# Patient Record
Sex: Male | Born: 1990 | Race: Black or African American | Hispanic: No | Marital: Single | Smoking: Current every day smoker
Health system: Southern US, Community
[De-identification: ages and names within clinical notes are randomized; demographics above are authoritative.]

---
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
## 2015-05-05 ENCOUNTER — Emergency Department (HOSPITAL_COMMUNITY)
Admission: EM | Admit: 2015-05-05 | Discharge: 2015-05-05 | Disposition: A | Discharge status: Home / Self Care | Payer: Self-pay | Attending: Physician Assistant | Admitting: Physician Assistant

## 2015-05-05 ENCOUNTER — Encounter (HOSPITAL_COMMUNITY): Payer: Self-pay | Admitting: General Practice

## 2015-05-05 ENCOUNTER — Emergency Department (HOSPITAL_COMMUNITY): Payer: Self-pay

## 2015-05-05 DIAGNOSIS — S60417A Abrasion of left little finger, initial encounter: Secondary | ICD-10-CM | POA: Insufficient documentation

## 2015-05-05 DIAGNOSIS — Y9289 Other specified places as the place of occurrence of the external cause: Secondary | ICD-10-CM | POA: Insufficient documentation

## 2015-05-05 DIAGNOSIS — Y9389 Activity, other specified: Secondary | ICD-10-CM | POA: Insufficient documentation

## 2015-05-05 DIAGNOSIS — S60312A Abrasion of left thumb, initial encounter: Secondary | ICD-10-CM | POA: Insufficient documentation

## 2015-05-05 DIAGNOSIS — S01111A Laceration without foreign body of right eyelid and periocular area, initial encounter: Secondary | ICD-10-CM | POA: Insufficient documentation

## 2015-05-05 DIAGNOSIS — S199XXA Unspecified injury of neck, initial encounter: Secondary | ICD-10-CM | POA: Insufficient documentation

## 2015-05-05 DIAGNOSIS — Y998 Other external cause status: Secondary | ICD-10-CM | POA: Insufficient documentation

## 2015-05-05 DIAGNOSIS — S4991XA Unspecified injury of right shoulder and upper arm, initial encounter: Secondary | ICD-10-CM | POA: Insufficient documentation

## 2015-05-05 DIAGNOSIS — S299XXA Unspecified injury of thorax, initial encounter: Secondary | ICD-10-CM | POA: Insufficient documentation

## 2015-05-05 DIAGNOSIS — Z720 Tobacco use: Secondary | ICD-10-CM | POA: Insufficient documentation

## 2015-05-05 DIAGNOSIS — Z23 Encounter for immunization: Secondary | ICD-10-CM | POA: Insufficient documentation

## 2015-05-05 LAB — CBC WITH DIFFERENTIAL/PLATELET
BASOS ABS: 0 10*3/uL (ref 0.0–0.1)
BASOS PCT: 0 %
EOS ABS: 0 10*3/uL (ref 0.0–0.7)
EOS PCT: 0 %
HCT: 43.7 % (ref 39.0–52.0)
HEMOGLOBIN: 15.4 g/dL (ref 13.0–17.0)
Lymphocytes Relative: 16 %
Lymphs Abs: 2 10*3/uL (ref 0.7–4.0)
MCH: 30.7 pg (ref 26.0–34.0)
MCHC: 35.2 g/dL (ref 30.0–36.0)
MCV: 87.2 fL (ref 78.0–100.0)
Monocytes Absolute: 1 10*3/uL (ref 0.1–1.0)
Monocytes Relative: 8 %
NEUTROS PCT: 76 %
Neutro Abs: 9.5 10*3/uL — ABNORMAL HIGH (ref 1.7–7.7)
PLATELETS: 293 10*3/uL (ref 150–400)
RBC: 5.01 MIL/uL (ref 4.22–5.81)
RDW: 13.3 % (ref 11.5–15.5)
WBC: 12.5 10*3/uL — AB (ref 4.0–10.5)

## 2015-05-05 LAB — BASIC METABOLIC PANEL
Anion gap: 9 (ref 5–15)
BUN: 11 mg/dL (ref 6–20)
CHLORIDE: 103 mmol/L (ref 101–111)
CO2: 24 mmol/L (ref 22–32)
Calcium: 9.6 mg/dL (ref 8.9–10.3)
Creatinine, Ser: 1.1 mg/dL (ref 0.61–1.24)
Glucose, Bld: 95 mg/dL (ref 65–99)
POTASSIUM: 3.2 mmol/L — AB (ref 3.5–5.1)
SODIUM: 136 mmol/L (ref 135–145)

## 2015-05-05 MED ORDER — OXYCODONE-ACETAMINOPHEN 5-325 MG PO TABS
2.0000 | ORAL_TABLET | Freq: Once | ORAL | Status: AC
  Administered 2015-05-05: 2 via ORAL
  Filled 2015-05-05: qty 2

## 2015-05-05 MED ORDER — LIDOCAINE HCL (PF) 1 % IJ SOLN
5.0000 mL | Freq: Once | INTRAMUSCULAR | Status: AC
  Administered 2015-05-05: 5 mL
  Filled 2015-05-05: qty 5

## 2015-05-05 MED ORDER — POTASSIUM CHLORIDE CRYS ER 20 MEQ PO TBCR
40.0000 meq | EXTENDED_RELEASE_TABLET | Freq: Once | ORAL | Status: AC
  Administered 2015-05-05: 40 meq via ORAL
  Filled 2015-05-05: qty 2

## 2015-05-05 MED ORDER — TETANUS-DIPHTH-ACELL PERTUSSIS 5-2.5-18.5 LF-MCG/0.5 IM SUSP
0.5000 mL | Freq: Once | INTRAMUSCULAR | Status: AC
  Administered 2015-05-05: 0.5 mL via INTRAMUSCULAR
  Filled 2015-05-05: qty 0.5

## 2015-05-05 MED ORDER — IBUPROFEN 600 MG PO TABS: 600.0000 mg | ORAL_TABLET | Freq: Four times a day (QID) | ORAL | Status: DC | PRN

## 2015-05-05 NOTE — ED Notes (Signed)
Checked patient vision right eye patient was 20/25 both eyes 20/20 left eye 20/20
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
## 2015-05-05 NOTE — Discharge Instructions (Signed)
1. Medications: ibuprofen, usual home medications 2. Treatment: rest, drink plenty of fluids 3. Follow Up: please followup with your primary doctor in 1 week for suture removal; if you do not have a primary care doctor use the resource guide provided to find one; please return to the ER for severe headache, numbness, weakness, persistent vomiting, lethargy, new or worsening symptoms   Emergency Department Resource Guide 1) Find a Doctor and Pay Out of Pocket Although you won't have to find out who is covered by your insurance plan, it is a good idea to ask around and get recommendations. You will then need to call the office and see if the doctor you have chosen will accept you as a new patient and what types of options they offer for patients who are self-pay. Some doctors offer discounts or will set up payment plans for their patients who do not have insurance, but you will need to ask so you aren't surprised when you get to your appointment.  2) Contact Your Local Health Department Not all health departments have doctors that can see patients for sick visits, but many do, so it is worth a call to see if yours does. If you don't know where your local health department is, you can check in your phone book. The CDC also has a tool to help you locate your state's health department, and many state websites also have listings of all of their local health departments.  3) Find a Walk-in Clinic If your illness is not likely to be very severe or complicated, you may want to try a walk in clinic. These are popping up all over the country in pharmacies, drugstores, and shopping centers. They're usually staffed by nurse practitioners or physician assistants that have been trained to treat common illnesses and complaints. They're usually fairly quick and inexpensive. However, if you have serious medical issues or chronic medical problems, these are probably not your best option.  No Primary Care Doctor: - Call  Health Connect at  [phone] - they can help you locate a primary care doctor that  accepts your insurance, provides certain services, etc. - Physician Referral Service- [phone]  Chronic Pain Problems: Organization         Address  Phone   Notes  [facility]  [phone] Patients need to be referred by their primary care doctor.   Medication Assistance: Organization         Address  Phone   Notes  [facility] Medication [facility] [address] [place], [place] [address] [phone] --Must be a resident of [facility] -- Must have NO insurance coverage whatsoever (no Medicaid/ Medicare, etc.) -- The pt. MUST have a primary care doctor that directs their care regularly and follows them in the community   [facility]ist  [phone]   [employer]  [phone]    Agencies that provide inexpensive medical care: Organization         Address  Phone   Notes  [facility]  [phone]   [facility]    [phone]   [facility] [address] [place], [place] [address] [phone]   [facility] [address] [place], [place] [phone]   Planned Parenthood    [phone]   [facility]    [phone]   [facility]  [address] [place], [employer] Phone:  [phone], Fax:  [phone](336) [phone] Hours of Operation:  9 am - 6 pm, M-F.  Also accepts Medicaid/Medicare and self-pay.  [facility]  [address], [place] Phone: [phone]) [phone], Fax: [phone]) [phone]. Hours of Operation:  8:30 am - 5:30 pm, M-F.  Also accepts Medicaid and self-pay.  [facility] [place] [address], [place] Phone: [phone]) [phone]   [facility] [address], [place], [place] [phone], Ext. [phone] Mondays & Thursdays: 7-9 AM.  First 15 patients are seen on a first come, first serve  basis.    Medicaid-accepting [facility] Providers:  Organization         Address  Phone   Notes  [facility] [address], [place] [phone]) [phone] Also accepts self-pay patients.  [facility] [address], [place]  [phone](336) [phone]   [facility] [address], [place] [phone]) [phone]   [facility] [address] [place] [address], [place] [phone](336) [phone]   Renaye RakersVeita Bland [address], [place]   [phone] Only accepts [place] Access [place] patients after they have their name applied to their card.   Self-Pay (no insurance) in [facility]:  Organization         Address  Phone   Notes  Sickle Cell Patients, [facility] [address], [place] [phone]) [phone]   [facility] [address], [place] [phone]) [phone]   [facility]  [address], [place] [phone]) [phone]   [facility]/Dr. Osei-Bonsu  [address] [place] [address], [place] or [phone], [place] [phone]) [phone] Phone number for both [place] and [place] locations is the same.  [facility] [address], [place] [phone]) [phone]   [facility] [place] [address] [place] [address], [place] or [address] [phone]) [phone]) [phone]   [facility] [address], [place] [phone](336) [phone], phone; [phone](336) [phone], fax Sees patients 1st and 3rd Saturday of every month.  Must not qualify for public or private insurance (i.e. Medicaid, Medicare, [place] Health Choice, Veterans' Benefits)  Household income should be no more than 200% of the poverty level The clinic cannot treat you if you are pregnant or think you are pregnant  Sexually transmitted diseases are not treated at the clinic.    Dental Care: Organization         Address  Phone  Notes  [facility] Department of [facility] [address], [place] [phone]) [phone] Accepts children up to age 25 who are enrolled in [place]icaid or [place] Health Choice; pregnant women with a Medicaid card; and children who have applied for Medicaid or [place] Health Choice, but were declined, whose parents can pay a reduced fee at time of service.  [facility] of [facility] [place]  [address], [place] [phone]) [phone] Accepts children up to age 25 who are enrolled in [place] or [place] Health Choice; pregnant women with a Medicaid card; and children who have applied for Medicaid or  Health Choice, but were declined, whose parents can pay a reduced fee at time of service.  [facility]  [address], [place] [phone] Patients are seen by appointment only. Walk-ins  are not accepted. [place] will see patients 35 years of age and older. Monday - Tuesday (8am-5pm) Most Wednesdays (8:30-5pm) $30 per visit, cash only  [facility] Access PRO[facility]  [address], [facility] [phone] Patients are seen by appointment only. Walk-ins are not accepted. [place] will see patients 30 years of age and older. One Wednesday Evening (Monthly: Volunteer Based).  $30 per visit, cash only  [place]  [phone] for adults; Children under age 99, call Graduate Pediatric Dentistry at [phone]. Children aged 48-14, please call [phone] to request a pediatric application.  Dental services are provided in all areas of dental care including fillings, crowns and bridges, complete and partial dentures, implants, gum treatment, root canals, and extractions. Preventive care is also provided. Treatment is provided to both adults and children. Patients are selected via a lottery and there is often a waiting list.   [facility] [address], [place]  [phone]  [email or web address]   [facility] [address] [place], [place] [phone], Ext. [phone] Second and Fourth Thursday of each month, opens at 6:30 AM; Clinic ends at 9 AM.  Patients are seen on a first-come first-served basis, and a limited number are seen during each clinic.   [facility]  [address] Dank[address] [place], [place] [phone]   Eligibility Requirements You must have lived in [place], [place], or [place] counties for at least the last three months.   You cannot be eligible for state or federal sponsored [employer], including [employer], [place], or [employer].   You generally cannot be eligible for healthcare insurance through your employer.    How to apply: Eligibility screenings are held every Tuesday and Wednesday afternoon from 1:00 pm until 4:00 pm. You do not need an appointment for the interview!  [facility] [address] [place], [place]   [place] Department  [place]  [phone]    Behavioral Health Resources in the Community: Intensive Outpatient Programs Organization         Address  Phone  Notes  [place] [address], [place], [place] [phone]   [facility] Outpatient [address] [place], [place]   ADS: Alcohol & Drug Svcs [address], [place], [place]   [place] [address]  [place], [place] or [phone]   Substance Abuse Resources Organization         Address  Phone  Notes  Alcohol and Drug Services  [phone]   [place]  [phone]   The [place]   Chinita Pester  [phone]   Residential & Outpatient Substance Abuse Program  [phone]   Psychological Services Organization          Address  Phone  Notes  [facility] [place]  [place]  [phone]   [place] [address], [place] [phone] or [phone]    Mobile Crisis Teams Organization         Address  Phone  Notes  [facility], Mobile Crisis Care Unit  [phone]   [facility]  [address] [place], [place]   [facility] [address], [place] [phone]  Self-Help/Support Groups Organization         Address  Phone             Notes  [facility]ental Health Assoc. of [place] - variety of support groups  336- [phone] Call for more information  Narcotics Anonymous (NA), [facility] [address], [place]  2 meetings at this location   Statisticianesidential Treatment Programs Organization         Address  Phone  Notes  [facility] [address],    [place]  [phone]   [facility]  [address], [place] [phone], [place], [place] [phone]   [facility] [address], [place] [phone] Admissions: 8am-3pm M-F  [facility] [address]    [place], [place] [phone]   [facility] [address], [place], [place] [phone]   [facility] [address]  [place], [place] [phone]   [facility] - Intensive Outpatient [address], [place], [place] [phone]   [facility] ([facility] Recovery [facility] [address]  [place], [place] [phone] or [phone]   [facility] (RTS) [address] [place], [place] [phone] Accepts Medicaid  [facility] [address]  [place] [phone] Substance Abuse/Addiction Treatment   [facility] Behavioral Health Resources Organization         Address  Phone  Notes  [facility]  [phone]) [phone]   Angie FavaJulie Brannon, PhD [address], [address] [place], [place]   [phone](336) [phone] or [phone](336) [phone]   [facility]   [address] [place], [place] [phone](336) [phone]   [facility] [address], [place], [place] [phone](336) [phone] Insurance/Medicaid/sponsorship through [facility]  Faith and Families [address]                                    [place], [place] [phone]) [phone] Therapy/tele-psych/case  [facility] [address].   [place], [place] [phone]) [phone]    Dr. Lolly MustacheArfeen  [phone](336) [phone]   [facility]  [employer] [facility] 1) [address], [place] 2) [address], [place] 3)  [address], [place] [phone](336) [phone](336) [phone]  [phone](336) [phone]   [facility] Child Abuse Hotline 8087837538(336) [phone] or [phone](336) [phone] (After Hours)
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
## 2015-07-16 ENCOUNTER — Emergency Department (HOSPITAL_COMMUNITY)
Admission: EM | Admit: 2015-07-16 | Discharge: 2015-07-16 | Disposition: A | Discharge status: Home / Self Care | Payer: BLUE CROSS/BLUE SHIELD | Attending: Emergency Medicine | Admitting: Emergency Medicine

## 2015-07-16 ENCOUNTER — Encounter (HOSPITAL_COMMUNITY): Payer: Self-pay | Admitting: Emergency Medicine

## 2015-07-16 DIAGNOSIS — R300 Dysuria: Secondary | ICD-10-CM | POA: Insufficient documentation

## 2015-07-16 DIAGNOSIS — F17210 Nicotine dependence, cigarettes, uncomplicated: Secondary | ICD-10-CM | POA: Diagnosis not present

## 2015-07-16 DIAGNOSIS — N4889 Other specified disorders of penis: Secondary | ICD-10-CM | POA: Insufficient documentation

## 2015-07-16 LAB — URINALYSIS, ROUTINE W REFLEX MICROSCOPIC
BILIRUBIN URINE: NEGATIVE
GLUCOSE, UA: NEGATIVE mg/dL
KETONES UR: NEGATIVE mg/dL
Nitrite: NEGATIVE
PH: 6 (ref 5.0–8.0)
PROTEIN: NEGATIVE mg/dL
Specific Gravity, Urine: 1.024 (ref 1.005–1.030)

## 2015-07-16 LAB — URINE MICROSCOPIC-ADD ON: Bacteria, UA: NONE SEEN

## 2015-07-16 MED ORDER — CEFTRIAXONE SODIUM 250 MG IJ SOLR
250.0000 mg | INTRAMUSCULAR | Status: DC
  Administered 2015-07-16: 250 mg via INTRAMUSCULAR
  Filled 2015-07-16: qty 250

## 2015-07-16 MED ORDER — STERILE WATER FOR INJECTION IJ SOLN
10.0000 mL | Freq: Once | INTRAMUSCULAR | Status: AC
  Administered 2015-07-16: 10 mL via INTRAMUSCULAR
  Filled 2015-07-16: qty 10

## 2015-07-16 MED ORDER — AZITHROMYCIN 250 MG PO TABS
1000.0000 mg | ORAL_TABLET | Freq: Once | ORAL | Status: AC
  Administered 2015-07-16: 1000 mg via ORAL
  Filled 2015-07-16: qty 4

## 2015-07-16 NOTE — Discharge Instructions (Signed)
You have been treated in the Emergency Department today for Gonorrhea and Chlamydia. If your results come back positive, you will receive a call from the hospital in the next 24-48 hours. If your labs come back positive, you need to inform your sexual partner(s) and advise them to either come to the Emergency Department or be treated at the Health Department. Please follow up with a primary care provider from the Resource Guide provided below in 4-5 days. Please return to the Emergency Department if symptoms worsen or new onset of fever, abdominal pain, nausea, vomiting, penile swelling, testicular swelling, rash, penile discharge, urinary retention.   Emergency Department Resource Guide 1) Find a Doctor and Pay Out of Pocket Although you won't have to find out who is covered by your insurance plan, it is a good idea to ask around and get recommendations. You will then need to call the office and see if the doctor you have chosen will accept you as a new patient and what types of options they offer for patients who are self-pay. Some doctors offer discounts or will set up payment plans for their patients who do not have insurance, but you will need to ask so you aren't surprised when you get to your appointment.  2) Contact Your Local Health Department Not all health departments have doctors that can see patients for sick visits, but many do, so it is worth a call to see if yours does. If you don't know where your local health department is, you can check in your phone book. The CDC also has a tool to help you locate your state's health department, and many state websites also have listings of all of their local health departments.  3) Find a Walk-in Clinic If your illness is not likely to be very severe or complicated, you may want to try a walk in clinic. These are popping up all over the country in pharmacies, drugstores, and shopping centers. They're usually staffed by nurse practitioners or  physician assistants that have been trained to treat common illnesses and complaints. They're usually fairly quick and inexpensive. However, if you have serious medical issues or chronic medical problems, these are probably not your best option.  No Primary Care Doctor: - Call Health Connect at  [phone] - they can help you locate a primary care doctor that  accepts your insurance, provides certain services, etc. - Physician Referral Service- [phone]  Chronic Pain Problems: Organization         Address  Phone   Notes  [facility]  [phone] Patients need to be referred by their primary care doctor.   Medication Assistance: Organization         Address  Phone   Notes  [facility] [address] [place] [place], [place] [address] [phone] --Must be a resident of [facility] -- Must have NO insurance coverage whatsoever (no Medicaid/ Medicare, etc.) -- The pt. MUST have a primary care doctor that directs their care regularly and follows them in the community   [facility]  [phone]   [employer]  [phone]    Agencies that provide inexpensive medical care: Organization         Address  Phone   Notes  [facility]  [phone]   [facility]    [phone]   [facility] [address] [place], [place] [address] [phone]  [facility] [address] [place], [place] [phone]   Planned Parenthood    [phone]   [facility]    [phone]   Community Health and [facility]  [address], [place] Phone:  [phone], Fax:  [phone] Hours of Operation:  9 am - 6 pm, M-F.  Also accepts Medicaid/Medicare and self-pay.  [facility]  [address], [place] Phone: [phone], Fax: [phone]. Hours of Operation:  8:30 am - 5:30 pm, M-F.   Also accepts Medicaid and self-pay.  [facility] [place] [address] [place] Phone: [phone]   [facility] [address] Natasha Bence [place], [place] [phone], Ext. [phone] Mondays & Thursdays: 7-9 AM.  First 15 patients are seen on a first come, first serve basis.    Medicaid-accepting [facility] Providers:  Organization         Address  Phone   Notes  [facility] [address], [place] [phone] Also accepts self-pay patients.  [facility] [address] Laurell Josephs [place], [place]  [phone]   [facility] [address], [place] [phone]   [facility] [address], [place] [phone]   Renaye Rakers [address], [place]   [phone] Only accepts [place] Access [place] patients after they have their name applied to their card.   Self-Pay (no insurance) in [facility]:  Organization         Address  Phone   Notes  Sickle Cell Patients, [facility] [address] [place], [place] [phone]   [facility] Urgent Care [address] [place], [place] [phone]   [facility]  [address] [place] [address], [place] [phone]   [facility]/Dr. Osei-Bonsu  [address] [place] or [address], [place] [phone] Phone number for both [place] and [place] locations is the same.  [facility]gent Medical and [facility] [address] [place] [phone]   [facility] [address], [place] or [address] [phone]   [facility] [address], [place] [phone], phone; [phone], fax Sees patients 1st and 3rd Saturday of every month.  Must not qualify for public or private insurance (i.e. Medicaid, Medicare, [place] Health Choice, Veterans'  Benefits)  Household income should be no more than 200% of the poverty level The clinic cannot treat you if you are pregnant or think you are pregnant  Sexually transmitted diseases are not treated at the clinic.    Dental Care: Organization         Address  Phone  Notes  [facility] Department of [facility] [address] [place], [place] [phone] Accepts children up to age 79 who are enrolled in [place] or [place] Health Choice; pregnant women with a Medicaid card; and children who have applied for Medicaid or [place] Health Choice, but were declined, whose parents can pay a reduced fee at time of service.  [facility] Department of [facility]  [address], [place] [phone] Accepts children up to age 76 who are enrolled in [place] or  Health Choice; pregnant women with a Medicaid card; and  children who have applied for Medicaid or [place] Health Choice, but were declined, whose parents can pay a reduced fee at time of service.  [facility]  [address] [place], [place] [phone] Patients are seen by appointment only. Walk-ins are not accepted. [facility] will see patients 54 years of age and older. Monday - Tuesday (8am-5pm) Most Wednesdays (8:30-5pm) $30 per visit, cash only  [facility] Adult Dental Access [facility]  [address], [facility] [phone] Patients are seen by appointment only. Walk-ins are not accepted. [facility] will see patients 57 years of age and older. One Wednesday Evening (Monthly: Volunteer Based).  $30 per visit, cash only  [employer]  [phone] for adults; Children under age 68, call Graduate Pediatric Dentistry at [phone]. Children aged 50-14, please call [phone] to request a pediatric application.  Dental services are provided in all areas of dental care including fillings, crowns and bridges, complete and partial  dentures, implants, gum treatment, root canals, and extractions. Preventive care is also provided. Treatment is provided to both adults and children. Patients are selected via a lottery and there is often a waiting list.   [facility] [address] [place]  [phone] [email or web address]   [facility] [address] [place], [place] [phone], Ext. [phone] Second and Fourth Thursday of each month, opens at 6:30 AM; Clinic ends at 9 AM.  Patients are seen on a first-come first-served basis, and a limited number are seen during each clinic.   [facility]  [address] Ether Griffins [place], [place] [phone]   Eligibility Requirements You must have lived in [place], [place], or [place] counties for at least the last three months.   You cannot be eligible for state or federal sponsored [place], including [employer], [place], or [employer].   You generally cannot be eligible for healthcare insurance through your employer.    How to apply: Eligibility screenings are held every Tuesday and Wednesday afternoon from 1:00 pm until 4:00 pm. You do not need an appointment for the interview!  [facility] [address], [place], [place] [phone]   [facility]  [phone]   [facility] Health Department  [phone]   [facility] Health Department  [phone]    Behavioral Health Resources in the Community: Intensive Outpatient Programs Organization         Address  Phone  Notes  [facility] [address] [place], [place] [phone]   [facility] Outpatient [address] [place], [place] [phone]   [facility] [address] [place], [place]  [phone]   [facility] [address]  [place], [place] [phone] or [phone]   Substance Abuse Resources Organization          Address  Phone  Notes  [facility]  2264532461   [facility]  [phone]   [facility]  [phone]   Floydene Flock  [phone]   Residential & Outpatient Substance Abuse Program  [phone]   Psychological Services Organization         Address  Phone  Notes  [facility] Behavioral Health  [phone]   [facility]  [phone]   [facility] Mental Health [address], [place] [phone] or [phone]    Mobile Crisis Teams Organization  Address  Phone  Notes  [facility], Mobile Crisis Care Unit  [phone]   [facility]  [address] [place], [place] [phone]   [facility] [address] [place] [phone]    Self-Help/Support Groups Organization         Address  Phone             Notes  [facility] [place] - variety of support groups  336- I7437963 Call for more information  Narcotics Anonymous (NA), [facility] [address], [employer] [place]  2 meetings at this location   Statistician         Address  Phone  Notes  [facility] [address],    [place]  [phone]   [facility]  [address], [place] [address], [place], [place] [phone]   [facility] [address] [place], [place] [phone] Admissions: 8am-3pm M-F  [facility] [address]    [place], [place] [phone]   [facility] [address] [place], [place], [place] [phone]   [facility] [address],  [place], [place] [phone]   [facility] - Intensive Outpatient [address] Laurell Josephs [address], [place], [place] [phone]   [facility] ([facility] [address] [place].,  [place], [place] [phone] or 6698735284   [facility] ([facility]) [address] [place], [place]  [phone] Accepts Medicaid  [facility] [place] [facility] [address],  [place] [phone] Substance Abuse/Addiction Treatment   [facility] Organization         Address  Phone  Notes  [facility]  [phone]   Angie Fava, PhD [address] Ervin Knack [place], [place]   [phone] or [phone]   [facility]   [address] [place], [place] [phone]   [facility] [address] [place], [place] [phone] Insurance/Medicaid/sponsorship through [facility] [address]                                    [place], [place] [phone] Therapy/tele-psych/case  [facility] [address], [place] [phone]    Dr. Lolly Mustache  [phone]   [facility]  [employer] [facility] Dept. 1) [address] [place] 2) [address], [place] 3)  [address], [place] [phone]  [phone]   [facility] Child Abuse Hotline (989)039-9596 or [phone] (After Hours)

## 2015-07-16 NOTE — ED Notes (Signed)
Declined W/C at D/C and was escorted to lobby by RN. 

## 2015-07-16 NOTE — ED Notes (Signed)
Pt sts hx of UTI and sts dysuria; pt denies discharge

## 2015-07-16 NOTE — ED Provider Notes (Signed)
CSN: [id number]     Arrival date & time 07/16/15  1044 History  By signing my name below, I, Linus Galas, attest that this documentation has been prepared under the direction and in the presence of Barrett Henle, PA-C. Electronically Signed: Linus Galas, ED Scribe. 07/16/2015. 1:12 PM     Chief Complaint  Patient presents with  . Dysuria   The history is provided by the patient. No language interpreter was used.   HPI Comments: Walter Grimes is a 25 y.o. male presents to the Emergency Department complaining of mild dysuria that began 1 week ago with associated penile pain. Patient states he was treated for a UTI 2 weeks ago with some relief of symptoms, pt does not remember what medication he took. Pt reports he is sexual activity and sometimes has unprotected sexual intercourse. Pt denies fever, urinary frequency, hematuria, penile discharge, rash, abdominal pain, nausea, vomiting, diarrhea, or blood in the stool.   History reviewed. No pertinent past medical history. History reviewed. No pertinent past surgical history. History reviewed. No pertinent family history. Social History  Substance Use Topics  . Smoking status: Current Every Day Smoker -- 1.00 packs/day    Types: Cigarettes  . Smokeless tobacco: None  . Alcohol Use: Yes    Review of Systems  Constitutional: Negative for fever.  Gastrointestinal: Negative for nausea, vomiting, abdominal pain, diarrhea and blood in stool.  Genitourinary: Positive for dysuria and penile pain. Negative for frequency, hematuria and discharge.    Allergies  Review of patient's allergies indicates no known allergies.  Home Medications   Prior to Admission medications   Medication Sig Start Date End Date Taking? Authorizing Provider  ibuprofen (ADVIL,MOTRIN) 600 MG tablet Take 1 tablet (600 mg total) by mouth every 6 (six) hours as needed. 05/05/15   Mady Gemma, PA-C   BP 112/66 mmHg  Pulse 62  Temp(Src) 98.7  F (37.1 C) (Oral)  Resp 18  SpO2 100% Physical Exam  Constitutional: He is oriented to person, place, and time. He appears well-developed and well-nourished.  HENT:  Head: Normocephalic and atraumatic.  Eyes: Conjunctivae and EOM are normal. Right eye exhibits no discharge. Left eye exhibits no discharge. No scleral icterus.  Neck: Normal range of motion. Neck supple.  Cardiovascular: Normal rate, regular rhythm and normal heart sounds.   Pulmonary/Chest: Effort normal and breath sounds normal. No respiratory distress. He has no wheezes. He has no rales. He exhibits no tenderness.  Abdominal: Soft. Bowel sounds are normal. He exhibits no distension and no mass. There is no tenderness. There is no rebound and no guarding.  Genitourinary: Testes normal. Circumcised. No penile erythema or penile tenderness. Discharge found.  Small amount of purulent drainage noted in urethra  Musculoskeletal: Normal range of motion.  Neurological: He is alert and oriented to person, place, and time.  Skin: Skin is warm and dry.  Psychiatric: He has a normal mood and affect.  Nursing note and vitals reviewed.   ED Course  Procedures  DIAGNOSTIC STUDIES: Oxygen Saturation is 100% on RA, nl by my interpretation.    COORDINATION OF CARE: 12:19 PM-Will order Urinalysis. Discussed treatment plan with pt at bedside and pt agreed to plan.   Labs Review Labs Reviewed  URINALYSIS, ROUTINE W REFLEX MICROSCOPIC (NOT AT [facility]) - Abnormal; Notable for the following:    APPearance CLOUDY (*)    Hgb urine dipstick TRACE (*)    Leukocytes, UA MODERATE (*)    All other components within  normal limits  URINE MICROSCOPIC-ADD ON - Abnormal; Notable for the following:    Squamous Epithelial / LPF 0-5 (*)    All other components within normal limits  URINE CULTURE  RPR  HIV ANTIBODY (ROUTINE TESTING)  GC/CHLAMYDIA PROBE AMP ([place]) NOT AT [facility]    Imaging Review No results found.   Filed Vitals:    07/16/15 1123 07/16/15 1334  BP: 108/74 112/66  Pulse: 71 62  Temp: 98.9 F (37.2 C) 98.7 F (37.1 C)  Resp: 18 18     MDM   Final diagnoses:  Dysuria    Patient presents with dysuria. Endorses being sexually active with multiple occurrences of unprotected sex.VSS. Exam revealed small amount of purulent drainage in the urethra, no penile lesions or rash noted. Abdominal exam unremarkable. UA positive for leukocytes and WBCs. Swab obtained for gonorrhea and chlamydia. Due to patient's presentation, exam and history of multiple episodes of unprotected sex will treat patient in the ED for STDs with Rocephin and Zithromax. Labs obtained for HIV and RPR. Discussed results and plan for discharge with patient. Patient given resource guide to follow up with PCP.  Evaluation does not show pathology requring ongoing emergent intervention or admission. Pt is hemodynamically stable and mentating appropriately. Discussed findings/results and plan with patient/guardian, who agrees with plan. All questions answered. Return precautions discussed and outpatient follow up given.    I personally performed the services described in this documentation, which was scribed in my presence. The recorded information has been reviewed and is accurate.      Satira Sark [place], [place] 07/16/15 1625  Doug Sou, MD 07/16/15 [phone]

## 2015-07-17 LAB — HIV ANTIBODY (ROUTINE TESTING W REFLEX): HIV SCREEN 4TH GENERATION: NONREACTIVE

## 2015-07-17 LAB — URINE CULTURE

## 2015-07-17 LAB — RPR: RPR Ser Ql: NONREACTIVE

## 2015-07-18 LAB — GC/CHLAMYDIA PROBE AMP (~~LOC~~) NOT AT ARMC
CHLAMYDIA, DNA PROBE: POSITIVE — AB
NEISSERIA GONORRHEA: POSITIVE — AB

## 2015-07-19 ENCOUNTER — Telehealth (HOSPITAL_COMMUNITY): Payer: Self-pay

## 2015-07-19 NOTE — Telephone Encounter (Signed)
Spoke with pt. Verified ID. Informed of labs. Treated per protocol. DHHS form faxed. Pt informed to abstain from sexual activity x 10 days and to notify partner for testing and treatment.  

## 2015-08-18 ENCOUNTER — Emergency Department (HOSPITAL_COMMUNITY)
Admission: EM | Admit: 2015-08-18 | Discharge: 2015-08-18 | Disposition: A | Discharge status: Home / Self Care | Payer: BLUE CROSS/BLUE SHIELD | Attending: Emergency Medicine | Admitting: Emergency Medicine

## 2015-08-18 ENCOUNTER — Encounter (HOSPITAL_COMMUNITY): Payer: Self-pay | Admitting: *Deleted

## 2015-08-18 ENCOUNTER — Emergency Department (HOSPITAL_COMMUNITY): Payer: BLUE CROSS/BLUE SHIELD

## 2015-08-18 DIAGNOSIS — R197 Diarrhea, unspecified: Secondary | ICD-10-CM | POA: Diagnosis not present

## 2015-08-18 DIAGNOSIS — R531 Weakness: Secondary | ICD-10-CM | POA: Diagnosis not present

## 2015-08-18 DIAGNOSIS — R630 Anorexia: Secondary | ICD-10-CM | POA: Diagnosis not present

## 2015-08-18 DIAGNOSIS — M791 Myalgia: Secondary | ICD-10-CM | POA: Insufficient documentation

## 2015-08-18 DIAGNOSIS — F17210 Nicotine dependence, cigarettes, uncomplicated: Secondary | ICD-10-CM | POA: Insufficient documentation

## 2015-08-18 DIAGNOSIS — R6889 Other general symptoms and signs: Secondary | ICD-10-CM

## 2015-08-18 DIAGNOSIS — J3489 Other specified disorders of nose and nasal sinuses: Secondary | ICD-10-CM | POA: Diagnosis not present

## 2015-08-18 DIAGNOSIS — R05 Cough: Secondary | ICD-10-CM | POA: Insufficient documentation

## 2015-08-18 DIAGNOSIS — R42 Dizziness and giddiness: Secondary | ICD-10-CM | POA: Insufficient documentation

## 2015-08-18 DIAGNOSIS — R109 Unspecified abdominal pain: Secondary | ICD-10-CM | POA: Diagnosis not present

## 2015-08-18 DIAGNOSIS — R112 Nausea with vomiting, unspecified: Secondary | ICD-10-CM | POA: Insufficient documentation

## 2015-08-18 DIAGNOSIS — R51 Headache: Secondary | ICD-10-CM | POA: Diagnosis not present

## 2015-08-18 DIAGNOSIS — R509 Fever, unspecified: Secondary | ICD-10-CM | POA: Insufficient documentation

## 2015-08-18 DIAGNOSIS — J029 Acute pharyngitis, unspecified: Secondary | ICD-10-CM | POA: Insufficient documentation

## 2015-08-18 MED ORDER — ONDANSETRON 4 MG PO TBDP
4.0000 mg | ORAL_TABLET | Freq: Once | ORAL | Status: AC | PRN
  Administered 2015-08-18: 4 mg via ORAL
  Filled 2015-08-18: qty 1

## 2015-08-18 MED ORDER — KETOROLAC TROMETHAMINE 60 MG/2ML IM SOLN
60.0000 mg | Freq: Once | INTRAMUSCULAR | Status: AC
  Administered 2015-08-18: 60 mg via INTRAMUSCULAR
  Filled 2015-08-18: qty 2

## 2015-08-18 MED ORDER — ACETAMINOPHEN 325 MG PO TABS
650.0000 mg | ORAL_TABLET | Freq: Once | ORAL | Status: AC | PRN
  Administered 2015-08-18: 650 mg via ORAL
  Filled 2015-08-18: qty 2

## 2015-08-18 MED ORDER — BENZONATATE 100 MG PO CAPS: 100.0000 mg | ORAL_CAPSULE | Freq: Three times a day (TID) | ORAL | Status: DC

## 2015-08-18 MED ORDER — ONDANSETRON 8 MG PO TBDP: 8.0000 mg | ORAL_TABLET | Freq: Three times a day (TID) | ORAL | Status: DC | PRN

## 2015-08-18 MED ORDER — BENZONATATE 100 MG PO CAPS
200.0000 mg | ORAL_CAPSULE | Freq: Once | ORAL | Status: AC
  Administered 2015-08-18: 200 mg via ORAL
  Filled 2015-08-18: qty 2

## 2015-08-18 MED ORDER — IBUPROFEN 800 MG PO TABS: 800.0000 mg | ORAL_TABLET | Freq: Three times a day (TID) | ORAL | Status: DC

## 2015-08-18 NOTE — ED Provider Notes (Signed)
CSN: [id number]     Arrival date & time 08/18/15  1548 History  By signing my name below, I, Tanda Rockers, attest that this documentation has been prepared under the direction and in the presence of Kamie Korber, PA-C.  Electronically Signed: Tanda Rockers, ED Scribe. 08/18/2015. 5:16 PM.   Chief Complaint  Patient presents with  . Weakness  . Cough  . Emesis   The history is provided by the patient. No language interpreter was used.     HPI Comments: Walter Grimes is a 25 y.o. male who presents to the Emergency Department complaining of gradual onset, constant, flu like symptoms including cough that began last night. Pt also complains of dizziness, nausea, vomiting, abdominal pain, generalized body aches, sore throat, rhinorrhea, and a headache. Pt mentions having diarrhea last night that has since resolved. Pt states that he has not eaten anything in the past 2 days due to loss of appetite. His temperature in the ED is 103.1. He has not taken anything for his symptoms. Pt cannot recall if he received the flu vaccine this year. Pt has had recent sick contact with similar symptoms. Denies any other associated symptoms.   History reviewed. No pertinent past medical history. History reviewed. No pertinent past surgical history. History reviewed. No pertinent family history. Social History  Substance Use Topics  . Smoking status: Current Every Day Smoker -- 1.00 packs/day    Types: Cigarettes  . Smokeless tobacco: Never Used  . Alcohol Use: Yes    Review of Systems  Constitutional: Positive for fever and appetite change.  HENT: Positive for rhinorrhea and sore throat.   Respiratory: Positive for cough.   Gastrointestinal: Positive for nausea, vomiting, abdominal pain and diarrhea (resolved).  Musculoskeletal: Positive for myalgias.  Neurological: Positive for dizziness and headaches.    Allergies  Review of patient's allergies indicates no known allergies.  Home  Medications   Prior to Admission medications   Medication Sig Start Date End Date Taking? Authorizing Provider  ibuprofen (ADVIL,MOTRIN) 600 MG tablet Take 1 tablet (600 mg total) by mouth every 6 (six) hours as needed. 05/05/15   Elizabeth C Westfall, PA-C   BP 131/76 mmHg  Pulse 100  Temp(Src) 103.1 F (39.5 C) (Oral)  Resp 22  Wt 175 lb (79.379 kg)  SpO2 100%   Physical Exam  Constitutional: He is oriented to person, place, and time. He appears well-developed and well-nourished. No distress.  HENT:  Head: Normocephalic and atraumatic.  Right Ear: Tympanic membrane, external ear and ear canal normal.  Left Ear: Tympanic membrane, external ear and ear canal normal.  Nose: Mucosal edema and rhinorrhea present.  Mouth/Throat: Uvula is midline, oropharynx is clear and moist and mucous membranes are normal.  Eyes: Conjunctivae and EOM are normal.  Neck: Normal range of motion. Neck supple. No tracheal deviation present.  No meningismus  Cardiovascular: Normal rate, regular rhythm and normal heart sounds.   Pulmonary/Chest: Effort normal and breath sounds normal. No respiratory distress. He has no wheezes. He has no rales.  Abdominal: Soft. Bowel sounds are normal. He exhibits no distension. There is no tenderness. There is no rebound.  Musculoskeletal: Normal range of motion.  Neurological: He is alert and oriented to person, place, and time.  Skin: Skin is warm and dry.  Psychiatric: He has a normal mood and affect. His behavior is normal.  Nursing note and vitals reviewed.   ED Course  Procedures (including critical care time)  DIAGNOSTIC STUDIES: Oxygen Saturation is  100% on RA, normal by my interpretation.    COORDINATION OF CARE: 5:14 PM-Discussed treatment plan which includes CXR with pt at bedside and pt agreed to plan.   Labs Review Labs Reviewed - No data to display  Imaging Review Dg Chest 2 View  08/18/2015  CLINICAL DATA:  Cough and congestion for 2 days  EXAM: CHEST  2 VIEW COMPARISON:  May 05, 2015 FINDINGS: The heart size and mediastinal contours are within normal limits. There is no focal infiltrate, pulmonary edema, or pleural effusion. The visualized skeletal structures are unremarkable. IMPRESSION: No active cardiopulmonary disease. Electronically Signed   By: Sherian Rein M.D.   On: 08/18/2015 17:37   I have personally reviewed and evaluated these images as part of my medical decision-making.   EKG Interpretation None      MDM   Final diagnoses:  Flu-like symptoms   Pt with URI symptoms, fever of 103.1 here. Symptom onset yesterday. He is otherwise healthy 24yo. No medications prior to coming in. Will give tylenol, toradol, zofran.   Temp and VS improved with medications. He feels better. CXR negative. Most likely influenza given symptoms and exam finding. Home with symptomatic tx, tylenol and motrin, zofran for nausea, tessalon for cough. Follow up with pcp.   Filed Vitals:   08/18/15 1644 08/18/15 1754  BP: 131/76 108/61  Pulse: 100 95  Temp: 103.1 F (39.5 C) 101.8 F (38.8 C)  TempSrc: Oral Oral  Resp: 22 20  Weight: 79.379 kg   SpO2: 100% 97%   I personally performed the services described in this documentation, which was scribed in my presence. The recorded information has been reviewed and is accurate.   Jaynie Crumble, PA-C 08/18/15 Rickey Primus  Nelva Nay, MD 08/22/15 1346

## 2015-08-18 NOTE — Discharge Instructions (Signed)
Ibuprofen for headache and fever. You can also take tylenol for additional symptom control. zofran for nausea and vomiting as needed. Tessalon for cough. Make sure to drink plenty of fluids. Rest. Follow up with primary care doctor.   Influenza, Adult Influenza ("the flu") is a viral infection of the respiratory tract. It occurs more often in winter months because people spend more time in close contact with one another. Influenza can make you feel very sick. Influenza easily spreads from person to person (contagious). CAUSES  Influenza is caused by a virus that infects the respiratory tract. You can catch the virus by breathing in droplets from an infected person's cough or sneeze. You can also catch the virus by touching something that was recently contaminated with the virus and then touching your mouth, nose, or eyes. RISKS AND COMPLICATIONS You may be at risk for a more severe case of influenza if you smoke cigarettes, have diabetes, have chronic heart disease (such as heart failure) or lung disease (such as asthma), or if you have a weakened immune system. Elderly people and pregnant women are also at risk for more serious infections. The most common problem of influenza is a lung infection (pneumonia). Sometimes, this problem can require emergency medical care and may be life threatening. SIGNS AND SYMPTOMS  Symptoms typically last 4 to 10 days and may include:  Fever.  Chills.  Headache, body aches, and muscle aches.  Sore throat.  Chest discomfort and cough.  Poor appetite.  Weakness or feeling tired.  Dizziness.  Nausea or vomiting. DIAGNOSIS  Diagnosis of influenza is often made based on your history and a physical exam. A nose or throat swab test can be done to confirm the diagnosis. TREATMENT  In mild cases, influenza goes away on its own. Treatment is directed at relieving symptoms. For more severe cases, your health care provider may prescribe antiviral medicines to  shorten the sickness. Antibiotic medicines are not effective because the infection is caused by a virus, not by bacteria. HOME CARE INSTRUCTIONS  Take medicines only as directed by your health care provider.  Use a cool mist humidifier to make breathing easier.  Get plenty of rest until your temperature returns to normal. This usually takes 3 to 4 days.  Drink enough fluid to keep your urine clear or pale yellow.  Cover yourmouth and nosewhen coughing or sneezing,and wash your handswellto prevent thevirusfrom spreading.  Stay homefromwork orschool untilthe fever is gonefor at least 25full day. PREVENTION  An annual influenza vaccination (flu shot) is the best way to avoid getting influenza. An annual flu shot is now routinely recommended for all adults in the [place] SEEK MEDICAL CARE IF:  You experiencechest pain, yourcough worsens,or you producemore mucus.  Youhave nausea,vomiting, ordiarrhea.  Your fever returns or gets worse. SEEK IMMEDIATE MEDICAL CARE IF:  You havetrouble breathing, you become short of breath,or your skin ornails becomebluish.  You have severe painor stiffnessin the neck.  You develop a sudden headache, or pain in the face or ear.  You have nausea or vomiting that you cannot control. MAKE SURE YOU:   Understand these instructions.  Will watch your condition.  Will get help right away if you are not doing well or get worse.   This information is not intended to replace advice given to you by your health care provider. Make sure you discuss any questions you have with your health care provider.   Document Released: 06/08/2000 Document Revised: 07/02/2014 Document Reviewed: 09/10/2011 Elsevier Interactive  Patient Education 2016 [employer].

## 2015-10-25 ENCOUNTER — Encounter: Payer: Self-pay | Admitting: Gastroenterology

## 2015-12-05 ENCOUNTER — Ambulatory Visit (HOSPITAL_COMMUNITY)
Admission: EM | Admit: 2015-12-05 | Discharge: 2015-12-05 | Discharge status: Absent without leave | Payer: BLUE CROSS/BLUE SHIELD

## 2015-12-22 ENCOUNTER — Other Ambulatory Visit: Payer: Self-pay

## 2015-12-22 ENCOUNTER — Ambulatory Visit: Payer: BLUE CROSS/BLUE SHIELD | Admitting: Gastroenterology

## 2016-01-23 ENCOUNTER — Encounter (HOSPITAL_COMMUNITY): Payer: Self-pay | Admitting: *Deleted

## 2016-01-23 ENCOUNTER — Ambulatory Visit (HOSPITAL_COMMUNITY)
Admission: EM | Admit: 2016-01-23 | Discharge: 2016-01-23 | Disposition: A | Discharge status: Home / Self Care | Payer: BLUE CROSS/BLUE SHIELD | Attending: Family Medicine | Admitting: Family Medicine

## 2016-01-23 DIAGNOSIS — M542 Cervicalgia: Secondary | ICD-10-CM

## 2016-01-23 MED ORDER — DICLOFENAC POTASSIUM 50 MG PO TABS: 50.0000 mg | ORAL_TABLET | Freq: Three times a day (TID) | ORAL | 0 refills | Status: DC

## 2016-01-23 MED ORDER — CYCLOBENZAPRINE HCL 5 MG PO TABS: 5.0000 mg | ORAL_TABLET | Freq: Three times a day (TID) | ORAL | 0 refills | Status: DC

## 2016-01-23 NOTE — Discharge Instructions (Signed)
Hot showers and medicines as needed. Return as needed.
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
## 2018-03-31 ENCOUNTER — Emergency Department (HOSPITAL_COMMUNITY): Payer: Self-pay

## 2018-03-31 ENCOUNTER — Encounter (HOSPITAL_COMMUNITY): Payer: Self-pay | Admitting: Emergency Medicine

## 2018-03-31 ENCOUNTER — Emergency Department (HOSPITAL_COMMUNITY)
Admission: EM | Admit: 2018-03-31 | Discharge: 2018-03-31 | Disposition: A | Discharge status: Home / Self Care | Payer: Self-pay | Attending: Emergency Medicine | Admitting: Emergency Medicine

## 2018-03-31 DIAGNOSIS — F17210 Nicotine dependence, cigarettes, uncomplicated: Secondary | ICD-10-CM | POA: Insufficient documentation

## 2018-03-31 DIAGNOSIS — Y939 Activity, unspecified: Secondary | ICD-10-CM | POA: Insufficient documentation

## 2018-03-31 DIAGNOSIS — Y999 Unspecified external cause status: Secondary | ICD-10-CM | POA: Insufficient documentation

## 2018-03-31 DIAGNOSIS — S2241XA Multiple fractures of ribs, right side, initial encounter for closed fracture: Secondary | ICD-10-CM | POA: Insufficient documentation

## 2018-03-31 DIAGNOSIS — Y929 Unspecified place or not applicable: Secondary | ICD-10-CM | POA: Insufficient documentation

## 2018-03-31 LAB — COMPREHENSIVE METABOLIC PANEL
ALT: 35 U/L (ref 0–44)
ANION GAP: 8 (ref 5–15)
AST: 47 U/L — ABNORMAL HIGH (ref 15–41)
Albumin: 4.6 g/dL (ref 3.5–5.0)
Alkaline Phosphatase: 60 U/L (ref 38–126)
BUN: 10 mg/dL (ref 6–20)
CALCIUM: 9.3 mg/dL (ref 8.9–10.3)
CHLORIDE: 108 mmol/L (ref 98–111)
CO2: 26 mmol/L (ref 22–32)
CREATININE: 1.11 mg/dL (ref 0.61–1.24)
Glucose, Bld: 112 mg/dL — ABNORMAL HIGH (ref 70–99)
Potassium: 4 mmol/L (ref 3.5–5.1)
SODIUM: 142 mmol/L (ref 135–145)
Total Bilirubin: 1 mg/dL (ref 0.3–1.2)
Total Protein: 7.4 g/dL (ref 6.5–8.1)

## 2018-03-31 LAB — CBC WITH DIFFERENTIAL/PLATELET
BASOS PCT: 0 %
Basophils Absolute: 0 10*3/uL (ref 0.0–0.1)
EOS ABS: 0 10*3/uL (ref 0.0–0.7)
Eosinophils Relative: 0 %
HEMATOCRIT: 43.8 % (ref 39.0–52.0)
HEMOGLOBIN: 15.1 g/dL (ref 13.0–17.0)
LYMPHS ABS: 1.8 10*3/uL (ref 0.7–4.0)
Lymphocytes Relative: 13 %
MCH: 31.3 pg (ref 26.0–34.0)
MCHC: 34.5 g/dL (ref 30.0–36.0)
MCV: 90.7 fL (ref 78.0–100.0)
Monocytes Absolute: 0.8 10*3/uL (ref 0.1–1.0)
Monocytes Relative: 6 %
NEUTROS ABS: 11.6 10*3/uL — AB (ref 1.7–7.7)
NEUTROS PCT: 81 %
Platelets: 301 10*3/uL (ref 150–400)
RBC: 4.83 MIL/uL (ref 4.22–5.81)
RDW: 14.1 % (ref 11.5–15.5)
WBC: 14.2 10*3/uL — AB (ref 4.0–10.5)

## 2018-03-31 MED ORDER — HYDROCODONE-ACETAMINOPHEN 5-325 MG PO TABS: 1.0000 | ORAL_TABLET | Freq: Three times a day (TID) | ORAL | 0 refills | Status: AC | PRN

## 2018-03-31 MED ORDER — METHOCARBAMOL 1000 MG/10ML IJ SOLN
1000.0000 mg | Freq: Once | INTRAMUSCULAR | Status: DC
  Filled 2018-03-31: qty 10

## 2018-03-31 MED ORDER — OXYCODONE-ACETAMINOPHEN 5-325 MG PO TABS
1.0000 | ORAL_TABLET | Freq: Once | ORAL | Status: AC
  Administered 2018-03-31: 1 via ORAL
  Filled 2018-03-31: qty 1

## 2018-03-31 MED ORDER — METHOCARBAMOL 500 MG PO TABS: 500.0000 mg | ORAL_TABLET | Freq: Two times a day (BID) | ORAL | 0 refills | Status: DC

## 2018-03-31 MED ORDER — MORPHINE SULFATE (PF) 4 MG/ML IV SOLN
6.0000 mg | Freq: Once | INTRAVENOUS | Status: AC
  Administered 2018-03-31: 6 mg via INTRAVENOUS
  Filled 2018-03-31: qty 2

## 2018-03-31 MED ORDER — ACETAMINOPHEN ER 650 MG PO TBCR: 650.0000 mg | EXTENDED_RELEASE_TABLET | Freq: Three times a day (TID) | ORAL | 0 refills | Status: DC | PRN

## 2018-03-31 MED ORDER — METHOCARBAMOL 1000 MG/10ML IJ SOLN
1000.0000 mg | Freq: Once | INTRAVENOUS | Status: AC
  Administered 2018-03-31: 1000 mg via INTRAVENOUS
  Filled 2018-03-31: qty 10
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
## 2018-03-31 NOTE — ED Notes (Signed)
ED Provider at bedside. 

## 2018-07-21 ENCOUNTER — Emergency Department (HOSPITAL_COMMUNITY)
Admission: EM | Admit: 2018-07-21 | Discharge: 2018-07-21 | Disposition: A | Discharge status: Home / Self Care | Payer: Self-pay | Attending: Emergency Medicine | Admitting: Emergency Medicine

## 2018-07-21 ENCOUNTER — Emergency Department (HOSPITAL_COMMUNITY): Payer: Self-pay

## 2018-07-21 ENCOUNTER — Encounter (HOSPITAL_COMMUNITY): Payer: Self-pay | Admitting: Emergency Medicine

## 2018-07-21 DIAGNOSIS — F17210 Nicotine dependence, cigarettes, uncomplicated: Secondary | ICD-10-CM | POA: Insufficient documentation

## 2018-07-21 DIAGNOSIS — R064 Hyperventilation: Secondary | ICD-10-CM

## 2018-07-21 DIAGNOSIS — R4020 Unspecified coma: Secondary | ICD-10-CM

## 2018-07-21 DIAGNOSIS — E873 Alkalosis: Secondary | ICD-10-CM | POA: Insufficient documentation

## 2018-07-21 LAB — COMPREHENSIVE METABOLIC PANEL
ALBUMIN: 4.7 g/dL (ref 3.5–5.0)
ALK PHOS: 68 U/L (ref 38–126)
ALT: 43 U/L (ref 0–44)
ANION GAP: 15 (ref 5–15)
AST: 51 U/L — ABNORMAL HIGH (ref 15–41)
BUN: 10 mg/dL (ref 6–20)
CALCIUM: 9.8 mg/dL (ref 8.9–10.3)
CO2: 22 mmol/L (ref 22–32)
Chloride: 104 mmol/L (ref 98–111)
Creatinine, Ser: 1.14 mg/dL (ref 0.61–1.24)
GFR calc non Af Amer: >60 mL/min (ref >60)
GLUCOSE: 93 mg/dL (ref 70–99)
POTASSIUM: 3.4 mmol/L — AB (ref 3.5–5.1)
SODIUM: 141 mmol/L (ref 135–145)
Total Bilirubin: 1.2 mg/dL (ref 0.3–1.2)
Total Protein: 7.9 g/dL (ref 6.5–8.1)

## 2018-07-21 LAB — POCT I-STAT 7, (LYTES, BLD GAS, ICA,H+H)
Acid-Base Excess: 1 mmol/L (ref 0.0–2.0)
Acid-base deficit: 1 mmol/L (ref 0.0–2.0)
BICARBONATE: 24.2 mmol/L (ref 20.0–28.0)
Bicarbonate: 20.1 mmol/L (ref 20.0–28.0)
Calcium, Ion: 1.15 mmol/L (ref 1.15–1.40)
Calcium, Ion: 1.31 mmol/L (ref 1.15–1.40)
HCT: 43 % (ref 39.0–52.0)
HEMATOCRIT: 41 % (ref 39.0–52.0)
Hemoglobin: 14.6 g/dL (ref 13.0–17.0)
Hemoglobin: 13.9 g/dL (ref 13.0–17.0)
O2 Saturation: 100 %
O2 Saturation: 98 %
PO2 ART: 109 mmHg — AB (ref 83.0–108.0)
Patient temperature: 98.7
Patient temperature: 98.6
Potassium: 3.6 mmol/L (ref 3.5–5.1)
Potassium: 3.6 mmol/L (ref 3.5–5.1)
SODIUM: 141 mmol/L (ref 135–145)
Sodium: 138 mmol/L (ref 135–145)
TCO2: 21 mmol/L — ABNORMAL LOW (ref 22–32)
TCO2: 25 mmol/L (ref 22–32)
pCO2 arterial: 19.5 mmHg — CL (ref 32.0–48.0)
pCO2 arterial: 41.5 mmHg (ref 32.0–48.0)
pH, Arterial: 7.623 (ref 7.350–7.450)
pH, Arterial: 7.375 (ref 7.350–7.450)
pO2, Arterial: 160 mmHg — ABNORMAL HIGH (ref 83.0–108.0)

## 2018-07-21 LAB — URINALYSIS, ROUTINE W REFLEX MICROSCOPIC
BACTERIA UA: NONE SEEN
BILIRUBIN URINE: NEGATIVE
GLUCOSE, UA: NEGATIVE mg/dL
Hgb urine dipstick: NEGATIVE
KETONES UR: NEGATIVE mg/dL
LEUKOCYTES UA: NEGATIVE
NITRITE: NEGATIVE
PH: 9 — AB (ref 5.0–8.0)
Protein, ur: 30 mg/dL — AB
SPECIFIC GRAVITY, URINE: 1.018 (ref 1.005–1.030)

## 2018-07-21 LAB — CBC WITH DIFFERENTIAL/PLATELET
Abs Immature Granulocytes: 0.01 10*3/uL (ref 0.00–0.07)
Basophils Absolute: 0 10*3/uL (ref 0.0–0.1)
Basophils Relative: 0 %
Eosinophils Absolute: 0 10*3/uL (ref 0.0–0.5)
Eosinophils Relative: 0 %
HCT: 44.8 % (ref 39.0–52.0)
Hemoglobin: 15.2 g/dL (ref 13.0–17.0)
Immature Granulocytes: 0 %
Lymphocytes Relative: 25 %
Lymphs Abs: 2.4 10*3/uL (ref 0.7–4.0)
MCH: 30.5 pg (ref 26.0–34.0)
MCHC: 33.9 g/dL (ref 30.0–36.0)
MCV: 90 fL (ref 80.0–100.0)
Monocytes Absolute: 0.6 10*3/uL (ref 0.1–1.0)
Monocytes Relative: 6 %
Neutro Abs: 6.5 10*3/uL (ref 1.7–7.7)
Neutrophils Relative %: 69 %
Platelets: 304 10*3/uL (ref 150–400)
RBC: 4.98 MIL/uL (ref 4.22–5.81)
RDW: 13 % (ref 11.5–15.5)
WBC: 9.5 10*3/uL (ref 4.0–10.5)
nRBC: 0 % (ref 0.0–0.2)

## 2018-07-21 LAB — RAPID URINE DRUG SCREEN, HOSP PERFORMED
Amphetamines: NOT DETECTED
BARBITURATES: NOT DETECTED
BENZODIAZEPINES: NOT DETECTED
COCAINE: NOT DETECTED
Opiates: NOT DETECTED
TETRAHYDROCANNABINOL: POSITIVE — AB

## 2018-07-21 LAB — ACETAMINOPHEN LEVEL: Acetaminophen (Tylenol), Serum: <10 ug/mL — ABNORMAL LOW (ref 10–30)

## 2018-07-21 LAB — CBG MONITORING, ED: GLUCOSE-CAPILLARY: 86 mg/dL (ref 70–99)

## 2018-07-21 LAB — SALICYLATE LEVEL: Salicylate Lvl: <7 mg/dL (ref 2.8–30.0)

## 2018-07-21 LAB — ETHANOL: Alcohol, Ethyl (B): <10 mg/dL

## 2018-07-21 LAB — LIPASE, BLOOD: Lipase: 23 U/L (ref 11–51)

## 2018-07-21 MED ORDER — LORAZEPAM 2 MG/ML IJ SOLN
0.5000 mg | Freq: Once | INTRAMUSCULAR | Status: DC
  Filled 2018-07-21: qty 1

## 2018-07-21 MED ORDER — LACTATED RINGERS IV BOLUS
1000.0000 mL | Freq: Once | INTRAVENOUS | Status: AC
  Administered 2018-07-21: 1000 mL via INTRAVENOUS

## 2018-07-21 MED ORDER — IOPAMIDOL (ISOVUE-370) INJECTION 76%
INTRAVENOUS | Status: AC
  Filled 2018-07-21: qty 100

## 2018-07-21 NOTE — ED Notes (Signed)
Patient verbalizes understanding of discharge instructions. Opportunity for questioning and answers were provided. Armband removed by staff, pt discharged from ED home via POV with family. 
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
## 2018-07-21 NOTE — ED Triage Notes (Signed)
Pt arrives via POV unresponsive and pulled out of car.

## 2018-08-08 ENCOUNTER — Emergency Department (HOSPITAL_COMMUNITY)
Admission: EM | Admit: 2018-08-08 | Discharge: 2018-08-09 | Disposition: A | Discharge status: Home / Self Care | Payer: Self-pay | Attending: Emergency Medicine | Admitting: Emergency Medicine

## 2018-08-08 ENCOUNTER — Encounter (HOSPITAL_COMMUNITY): Payer: Self-pay

## 2018-08-08 DIAGNOSIS — R51 Headache: Secondary | ICD-10-CM | POA: Insufficient documentation

## 2018-08-08 DIAGNOSIS — Z5321 Procedure and treatment not carried out due to patient leaving prior to being seen by health care provider: Secondary | ICD-10-CM | POA: Insufficient documentation

## 2018-08-08 NOTE — ED Triage Notes (Signed)
Pt states that he has been having a headache since he was here two weeks ago with a panic attacks, some nausea denies phoro sensitivity.

## 2018-08-09 NOTE — ED Notes (Signed)
No answer for room x2 

## 2018-08-09 NOTE — ED Notes (Signed)
No answer per Zella Ball, EMT

## 2018-09-30 ENCOUNTER — Emergency Department (HOSPITAL_COMMUNITY)
Admission: EM | Admit: 2018-09-30 | Discharge: 2018-09-30 | Disposition: A | Discharge status: Home / Self Care | Payer: Self-pay | Attending: Emergency Medicine | Admitting: Emergency Medicine

## 2018-09-30 ENCOUNTER — Encounter (HOSPITAL_COMMUNITY): Payer: Self-pay

## 2018-09-30 ENCOUNTER — Other Ambulatory Visit: Payer: Self-pay

## 2018-09-30 DIAGNOSIS — F1290 Cannabis use, unspecified, uncomplicated: Secondary | ICD-10-CM | POA: Insufficient documentation

## 2018-09-30 DIAGNOSIS — R404 Transient alteration of awareness: Secondary | ICD-10-CM | POA: Insufficient documentation

## 2018-09-30 DIAGNOSIS — F17210 Nicotine dependence, cigarettes, uncomplicated: Secondary | ICD-10-CM | POA: Insufficient documentation

## 2018-09-30 DIAGNOSIS — R064 Hyperventilation: Secondary | ICD-10-CM | POA: Insufficient documentation

## 2018-09-30 LAB — CBC WITH DIFFERENTIAL/PLATELET
Abs Immature Granulocytes: 0.05 10*3/uL (ref 0.00–0.07)
Basophils Absolute: 0 10*3/uL (ref 0.0–0.1)
Basophils Relative: 0 %
Eosinophils Absolute: 0 10*3/uL (ref 0.0–0.5)
Eosinophils Relative: 0 %
HCT: 42.9 % (ref 39.0–52.0)
Hemoglobin: 14.4 g/dL (ref 13.0–17.0)
Immature Granulocytes: 0 %
Lymphocytes Relative: 19 %
Lymphs Abs: 2.5 10*3/uL (ref 0.7–4.0)
MCH: 30.5 pg (ref 26.0–34.0)
MCHC: 33.6 g/dL (ref 30.0–36.0)
MCV: 90.9 fL (ref 80.0–100.0)
Monocytes Absolute: 1 10*3/uL (ref 0.1–1.0)
Monocytes Relative: 8 %
Neutro Abs: 9.3 10*3/uL — ABNORMAL HIGH (ref 1.7–7.7)
Neutrophils Relative %: 73 %
Platelets: 309 10*3/uL (ref 150–400)
RBC: 4.72 MIL/uL (ref 4.22–5.81)
RDW: 13.2 % (ref 11.5–15.5)
WBC: 12.9 10*3/uL — ABNORMAL HIGH (ref 4.0–10.5)
nRBC: 0 % (ref 0.0–0.2)

## 2018-09-30 LAB — COMPREHENSIVE METABOLIC PANEL
ALT: 51 U/L — ABNORMAL HIGH (ref 0–44)
AST: 52 U/L — ABNORMAL HIGH (ref 15–41)
Albumin: 4.2 g/dL (ref 3.5–5.0)
Alkaline Phosphatase: 77 U/L (ref 38–126)
Anion gap: 8 (ref 5–15)
BUN: 11 mg/dL (ref 6–20)
CO2: 25 mmol/L (ref 22–32)
Calcium: 9.5 mg/dL (ref 8.9–10.3)
Chloride: 105 mmol/L (ref 98–111)
Creatinine, Ser: 1.03 mg/dL (ref 0.61–1.24)
GFR calc Af Amer: >60 mL/min (ref >60)
GFR calc non Af Amer: >60 mL/min (ref >60)
Glucose, Bld: 102 mg/dL — ABNORMAL HIGH (ref 70–99)
Potassium: 3.2 mmol/L — ABNORMAL LOW (ref 3.5–5.1)
Sodium: 138 mmol/L (ref 135–145)
Total Bilirubin: 0.9 mg/dL (ref 0.3–1.2)
Total Protein: 7 g/dL (ref 6.5–8.1)

## 2018-09-30 LAB — POCT I-STAT EG7
Acid-Base Excess: 2 mmol/L (ref 0.0–2.0)
Bicarbonate: 24.5 mmol/L (ref 20.0–28.0)
Calcium, Ion: 1.12 mmol/L — ABNORMAL LOW (ref 1.15–1.40)
HCT: 44 % (ref 39.0–52.0)
Hemoglobin: 15 g/dL (ref 13.0–17.0)
O2 Saturation: 67 %
Patient temperature: 37
Potassium: 3.1 mmol/L — ABNORMAL LOW (ref 3.5–5.1)
Sodium: 140 mmol/L (ref 135–145)
TCO2: 25 mmol/L (ref 22–32)
pCO2, Ven: 32 mmHg — ABNORMAL LOW (ref 44.0–60.0)
pH, Ven: 7.492 — ABNORMAL HIGH (ref 7.250–7.430)
pO2, Ven: 32 mmHg (ref 32.0–45.0)

## 2018-09-30 LAB — URINALYSIS, ROUTINE W REFLEX MICROSCOPIC
Bilirubin Urine: NEGATIVE
Glucose, UA: NEGATIVE mg/dL
Hgb urine dipstick: NEGATIVE
Ketones, ur: NEGATIVE mg/dL
Leukocytes,Ua: NEGATIVE
Nitrite: NEGATIVE
Protein, ur: NEGATIVE mg/dL
Specific Gravity, Urine: 1.025 (ref 1.005–1.030)
pH: 7 (ref 5.0–8.0)

## 2018-09-30 LAB — RAPID URINE DRUG SCREEN, HOSP PERFORMED
Amphetamines: NOT DETECTED
Barbiturates: NOT DETECTED
Benzodiazepines: NOT DETECTED
Cocaine: NOT DETECTED
Opiates: NOT DETECTED
Tetrahydrocannabinol: POSITIVE — AB

## 2018-09-30 LAB — ETHANOL: Alcohol, Ethyl (B): <10 mg/dL (ref <10)

## 2018-09-30 MED ORDER — AMMONIA AROMATIC IN INHA
RESPIRATORY_TRACT | Status: AC
  Administered 2018-09-30: 03:00:00
  Filled 2018-09-30: qty 10
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
## 2018-12-08 ENCOUNTER — Encounter (HOSPITAL_COMMUNITY): Payer: Self-pay

## 2018-12-08 ENCOUNTER — Inpatient Hospital Stay (HOSPITAL_COMMUNITY)
Admission: EM | Admit: 2018-12-08 | Discharge: 2018-12-19 | DRG: 064 | Disposition: A | Discharge status: Rehabilitation Facility | Payer: Medicaid Other | Attending: Internal Medicine | Admitting: Internal Medicine

## 2018-12-08 DIAGNOSIS — F419 Anxiety disorder, unspecified: Secondary | ICD-10-CM | POA: Diagnosis present

## 2018-12-08 DIAGNOSIS — K274 Chronic or unspecified peptic ulcer, site unspecified, with hemorrhage: Secondary | ICD-10-CM | POA: Diagnosis not present

## 2018-12-08 DIAGNOSIS — Z1159 Encounter for screening for other viral diseases: Secondary | ICD-10-CM

## 2018-12-08 DIAGNOSIS — I10 Essential (primary) hypertension: Secondary | ICD-10-CM | POA: Diagnosis present

## 2018-12-08 DIAGNOSIS — F329 Major depressive disorder, single episode, unspecified: Secondary | ICD-10-CM | POA: Diagnosis present

## 2018-12-08 DIAGNOSIS — G8194 Hemiplegia, unspecified affecting left nondominant side: Secondary | ICD-10-CM | POA: Diagnosis present

## 2018-12-08 DIAGNOSIS — J9690 Respiratory failure, unspecified, unspecified whether with hypoxia or hypercapnia: Secondary | ICD-10-CM

## 2018-12-08 DIAGNOSIS — F17210 Nicotine dependence, cigarettes, uncomplicated: Secondary | ICD-10-CM | POA: Diagnosis present

## 2018-12-08 DIAGNOSIS — I615 Nontraumatic intracerebral hemorrhage, intraventricular: Secondary | ICD-10-CM | POA: Diagnosis present

## 2018-12-08 DIAGNOSIS — G936 Cerebral edema: Secondary | ICD-10-CM | POA: Diagnosis not present

## 2018-12-08 DIAGNOSIS — R29810 Facial weakness: Secondary | ICD-10-CM | POA: Diagnosis present

## 2018-12-08 DIAGNOSIS — G40409 Other generalized epilepsy and epileptic syndromes, not intractable, without status epilepticus: Secondary | ICD-10-CM | POA: Diagnosis present

## 2018-12-08 DIAGNOSIS — I609 Nontraumatic subarachnoid hemorrhage, unspecified: Secondary | ICD-10-CM | POA: Diagnosis present

## 2018-12-08 DIAGNOSIS — R4189 Other symptoms and signs involving cognitive functions and awareness: Secondary | ICD-10-CM | POA: Diagnosis present

## 2018-12-08 DIAGNOSIS — E876 Hypokalemia: Secondary | ICD-10-CM | POA: Diagnosis not present

## 2018-12-08 DIAGNOSIS — R4182 Altered mental status, unspecified: Secondary | ICD-10-CM

## 2018-12-08 DIAGNOSIS — I611 Nontraumatic intracerebral hemorrhage in hemisphere, cortical: Principal | ICD-10-CM | POA: Diagnosis present

## 2018-12-08 DIAGNOSIS — J9601 Acute respiratory failure with hypoxia: Secondary | ICD-10-CM | POA: Diagnosis not present

## 2018-12-08 DIAGNOSIS — I619 Nontraumatic intracerebral hemorrhage, unspecified: Secondary | ICD-10-CM

## 2018-12-08 DIAGNOSIS — G4731 Primary central sleep apnea: Secondary | ICD-10-CM | POA: Diagnosis present

## 2018-12-08 LAB — CBG MONITORING, ED: Glucose-Capillary: 117 mg/dL — ABNORMAL HIGH (ref 70–99)

## 2018-12-08 NOTE — ED Provider Notes (Signed)
TIME SEEN: 11:49 PM  CHIEF COMPLAINT: Altered mental status  HPI: Patient is a 28 year old male with unknown past medical history who presents to the emergency department with altered mental status.  It is unclear when he was last seen normal.  Most of the history is provided by EMS and patient's girlfriend.  Patient's girlfriend states that tonight he began complaining of chest discomfort and felt like he was going to pass out.  He then began vomiting.  Is unclear if there was any head injury.  In the car he became unresponsive and had possible seizure-like activity.  No known history of previous seizures.  ROS: Level 5 caveat for altered mental status  PAST MEDICAL HISTORY/PAST SURGICAL HISTORY:  History reviewed. No pertinent past medical history.  MEDICATIONS:  Prior to Admission medications   Medication Sig Start Date End Date Taking? Authorizing Provider  acetaminophen (TYLENOL 8 HOUR) 650 MG CR tablet Take 1 tablet (650 mg total) by mouth every 8 (eight) hours as needed. 03/31/18   Derwood KaplanNanavati, Ankit, MD  benzonatate (TESSALON) 100 MG capsule Take 1 capsule (100 mg total) by mouth every 8 (eight) hours. Patient not taking: Reported on 03/31/2018 08/18/15   Jaynie CrumbleKirichenko, Tatyana, PA-C  cyclobenzaprine (FLEXERIL) 5 MG tablet Take 1 tablet (5 mg total) by mouth 3 (three) times daily. Patient not taking: Reported on 03/31/2018 01/23/16   Linna HoffKindl, James D, MD  diclofenac (CATAFLAM) 50 MG tablet Take 1 tablet (50 mg total) by mouth 3 (three) times daily. Patient not taking: Reported on 03/31/2018 01/23/16   Linna HoffKindl, James D, MD  ibuprofen (ADVIL,MOTRIN) 800 MG tablet Take 1 tablet (800 mg total) by mouth 3 (three) times daily. Patient not taking: Reported on 03/31/2018 08/18/15   Jaynie CrumbleKirichenko, Tatyana, PA-C  methocarbamol (ROBAXIN) 500 MG tablet Take 1 tablet (500 mg total) by mouth 2 (two) times daily. 03/31/18   Derwood KaplanNanavati, Ankit, MD  ondansetron (ZOFRAN ODT) 8 MG disintegrating tablet Take 1 tablet (8 mg total)  by mouth every 8 (eight) hours as needed for nausea or vomiting. Patient not taking: Reported on 03/31/2018 08/18/15   Jaynie CrumbleKirichenko, Tatyana, PA-C    ALLERGIES:  No Known Allergies  SOCIAL HISTORY:  Social History   Tobacco Use  . Smoking status: Current Every Day Smoker    Packs/day: 1.00    Types: Cigarettes  . Smokeless tobacco: Never Used  Substance Use Topics  . Alcohol use: Yes    FAMILY HISTORY: History reviewed. No pertinent family history.  EXAM: BP 121/72 (BP Location: Right Arm)   Pulse 65   Temp 97.8 F (36.6 C) (Axillary)   Resp (!) 21   SpO2 100%  CONSTITUTIONAL: Patient will open his eyes to verbal stimuli but not answer questions or follow commands. HEAD: Normocephalic, atraumatic EYES: Conjunctivae clear, pupils appear equal, EOMI ENT: normal nose; moist mucous membranes NECK: Supple, no meningismus, no nuchal rigidity, no LAD  CARD: RRR; S1 and S2 appreciated; no murmurs, no clicks, no rubs, no gallops RESP: Normal chest excursion without splinting or tachypnea; breath sounds clear and equal bilaterally; no wheezes, no rhonchi, no rales, no hypoxia or respiratory distress, speaking full sentences ABD/GI: Normal bowel sounds; non-distended; soft, non-tender, no rebound, no guarding, no peritoneal signs, no hepatosplenomegaly BACK:  The back appears normal and is non-tender to palpation, there is no CVA tenderness EXT: Normal ROM in all joints; non-tender to palpation; no edema; normal capillary refill; no cyanosis, no calf tenderness or swelling    SKIN: Normal color for age  and race; warm; no rash NEURO: Patient opens his eyes to verbal stimuli but does not answer questions or follow any commands. PSYCH: The patient's mood and manner are appropriate. Grooming and personal hygiene are appropriate.  MEDICAL DECISION MAKING: Patient here with altered mental status.  Possible seizure-like activity per EMS.  Blood sugar here is normal.  Will obtain labs, urine,  head CT.  He is hemodynamically stable here.  ED PROGRESS: Patient's head CT shows 5.8 x 2.2 cm left parietal hemorrhage with intraventricular extension.  Dr. Cheral Marker with neurology has been consulted for admission.  Labs are markable other than mild leukocytosis which is likely reactive.  Patient now more awake.  He is able to answer yes or no.  He can move his left upper and lower extremity without difficulty but does not move the right upper or lower extremity.  He does not appear to have facial asymmetry.  No dysarthria.  Extraocular movements are intact.  Patient has been given IV Keppra.   I reviewed all nursing notes, vitals, pertinent previous records, EKGs, lab and urine results, imaging (as available).    EKG Interpretation  Date/Time:  Monday December 08 2018 23:34:14 EDT Ventricular Rate:  58 PR Interval:    QRS Duration: 98 QT Interval:  444 QTC Calculation: 437 R Axis:   36 Text Interpretation:  Sinus rhythm RSR' in V1 or V2, probably normal variant ST elev, probable normal early repol pattern No significant change since last tracing Confirmed by Ward, Cyril Mourning [phone]) on 12/08/2018 11:41:30 PM        CRITICAL CARE Performed by: Cyril Mourning Ward   Total critical care time: 50 minutes  Critical care time was exclusive of separately billable procedures and treating other patients.  Critical care was necessary to treat or prevent imminent or life-threatening deterioration.  Critical care was time spent personally by me on the following activities: development of treatment plan with patient and/or surrogate as well as nursing, discussions with consultants, evaluation of patient's response to treatment, examination of patient, obtaining history from patient or surrogate, ordering and performing treatments and interventions, ordering and review of laboratory studies, ordering and review of radiographic studies, pulse oximetry and re-evaluation of patient's condition.      Ward,  Delice Bison, DO 12/09/18 0221

## 2018-12-08 NOTE — ED Triage Notes (Addendum)
Pt had a possible seizure was seen couple weeks ago for this and never diagnosis with this and or was not given any meds or anything. Pt did some left hand tremors with his left arm. Pt able to nod to ems questions but didn't really answer questions. Pt did vomit beside car. Pt has been lethargic for ems. When pt arrived pt not responding to questions but opens his eyes. But did have a little tremor in the left hand at this time. Pt is also diaphoretic at this time.

## 2018-12-09 ENCOUNTER — Inpatient Hospital Stay (HOSPITAL_COMMUNITY): Payer: Medicaid Other

## 2018-12-09 ENCOUNTER — Emergency Department (HOSPITAL_COMMUNITY): Payer: Medicaid Other

## 2018-12-09 DIAGNOSIS — G4731 Primary central sleep apnea: Secondary | ICD-10-CM | POA: Diagnosis present

## 2018-12-09 DIAGNOSIS — Z716 Tobacco abuse counseling: Secondary | ICD-10-CM | POA: Diagnosis not present

## 2018-12-09 DIAGNOSIS — G40909 Epilepsy, unspecified, not intractable, without status epilepticus: Secondary | ICD-10-CM | POA: Diagnosis present

## 2018-12-09 DIAGNOSIS — R4189 Other symptoms and signs involving cognitive functions and awareness: Secondary | ICD-10-CM | POA: Diagnosis present

## 2018-12-09 DIAGNOSIS — G40409 Other generalized epilepsy and epileptic syndromes, not intractable, without status epilepticus: Secondary | ICD-10-CM | POA: Diagnosis present

## 2018-12-09 DIAGNOSIS — F1290 Cannabis use, unspecified, uncomplicated: Secondary | ICD-10-CM | POA: Diagnosis present

## 2018-12-09 DIAGNOSIS — F329 Major depressive disorder, single episode, unspecified: Secondary | ICD-10-CM | POA: Diagnosis present

## 2018-12-09 DIAGNOSIS — G936 Cerebral edema: Secondary | ICD-10-CM | POA: Diagnosis not present

## 2018-12-09 DIAGNOSIS — F17210 Nicotine dependence, cigarettes, uncomplicated: Secondary | ICD-10-CM | POA: Diagnosis present

## 2018-12-09 DIAGNOSIS — F419 Anxiety disorder, unspecified: Secondary | ICD-10-CM | POA: Diagnosis present

## 2018-12-09 DIAGNOSIS — Z8249 Family history of ischemic heart disease and other diseases of the circulatory system: Secondary | ICD-10-CM | POA: Diagnosis not present

## 2018-12-09 DIAGNOSIS — Z7151 Drug abuse counseling and surveillance of drug abuser: Secondary | ICD-10-CM | POA: Diagnosis not present

## 2018-12-09 DIAGNOSIS — R29810 Facial weakness: Secondary | ICD-10-CM | POA: Diagnosis present

## 2018-12-09 DIAGNOSIS — I69119 Unspecified symptoms and signs involving cognitive functions following nontraumatic intracerebral hemorrhage: Secondary | ICD-10-CM | POA: Diagnosis present

## 2018-12-09 DIAGNOSIS — I615 Nontraumatic intracerebral hemorrhage, intraventricular: Secondary | ICD-10-CM

## 2018-12-09 DIAGNOSIS — I609 Nontraumatic subarachnoid hemorrhage, unspecified: Secondary | ICD-10-CM | POA: Diagnosis present

## 2018-12-09 DIAGNOSIS — Z5329 Procedure and treatment not carried out because of patient's decision for other reasons: Secondary | ICD-10-CM | POA: Diagnosis present

## 2018-12-09 DIAGNOSIS — K274 Chronic or unspecified peptic ulcer, site unspecified, with hemorrhage: Secondary | ICD-10-CM | POA: Diagnosis not present

## 2018-12-09 DIAGNOSIS — J9601 Acute respiratory failure with hypoxia: Secondary | ICD-10-CM | POA: Diagnosis not present

## 2018-12-09 DIAGNOSIS — Z7141 Alcohol abuse counseling and surveillance of alcoholic: Secondary | ICD-10-CM | POA: Diagnosis not present

## 2018-12-09 DIAGNOSIS — G479 Sleep disorder, unspecified: Secondary | ICD-10-CM | POA: Diagnosis present

## 2018-12-09 DIAGNOSIS — R4182 Altered mental status, unspecified: Secondary | ICD-10-CM | POA: Diagnosis present

## 2018-12-09 DIAGNOSIS — I639 Cerebral infarction, unspecified: Secondary | ICD-10-CM

## 2018-12-09 DIAGNOSIS — E876 Hypokalemia: Secondary | ICD-10-CM | POA: Diagnosis not present

## 2018-12-09 DIAGNOSIS — G8194 Hemiplegia, unspecified affecting left nondominant side: Secondary | ICD-10-CM | POA: Diagnosis present

## 2018-12-09 DIAGNOSIS — R0989 Other specified symptoms and signs involving the circulatory and respiratory systems: Secondary | ICD-10-CM | POA: Diagnosis present

## 2018-12-09 DIAGNOSIS — F411 Generalized anxiety disorder: Secondary | ICD-10-CM | POA: Diagnosis present

## 2018-12-09 DIAGNOSIS — R740 Nonspecific elevation of levels of transaminase and lactic acid dehydrogenase [LDH]: Secondary | ICD-10-CM | POA: Diagnosis present

## 2018-12-09 DIAGNOSIS — I10 Essential (primary) hypertension: Secondary | ICD-10-CM | POA: Diagnosis present

## 2018-12-09 DIAGNOSIS — I619 Nontraumatic intracerebral hemorrhage, unspecified: Secondary | ICD-10-CM | POA: Diagnosis present

## 2018-12-09 DIAGNOSIS — I612 Nontraumatic intracerebral hemorrhage in hemisphere, unspecified: Secondary | ICD-10-CM

## 2018-12-09 DIAGNOSIS — I611 Nontraumatic intracerebral hemorrhage in hemisphere, cortical: Secondary | ICD-10-CM | POA: Diagnosis present

## 2018-12-09 DIAGNOSIS — Z1159 Encounter for screening for other viral diseases: Secondary | ICD-10-CM | POA: Diagnosis not present

## 2018-12-09 DIAGNOSIS — R569 Unspecified convulsions: Secondary | ICD-10-CM

## 2018-12-09 LAB — RAPID URINE DRUG SCREEN, HOSP PERFORMED
Amphetamines: NOT DETECTED
Barbiturates: NOT DETECTED
Benzodiazepines: NOT DETECTED
Cocaine: NOT DETECTED
Opiates: NOT DETECTED
Tetrahydrocannabinol: POSITIVE — AB

## 2018-12-09 LAB — BASIC METABOLIC PANEL
Anion gap: 10 (ref 5–15)
BUN: 10 mg/dL (ref 6–20)
CO2: 25 mmol/L (ref 22–32)
Calcium: 9.3 mg/dL (ref 8.9–10.3)
Chloride: 106 mmol/L (ref 98–111)
Creatinine, Ser: 1.22 mg/dL (ref 0.61–1.24)
GFR calc Af Amer: >60 mL/min (ref >60)
GFR calc non Af Amer: >60 mL/min (ref >60)
Glucose, Bld: 144 mg/dL — ABNORMAL HIGH (ref 70–99)
Potassium: 3.2 mmol/L — ABNORMAL LOW (ref 3.5–5.1)
Sodium: 141 mmol/L (ref 135–145)

## 2018-12-09 LAB — CBC
HCT: 43.5 % (ref 39.0–52.0)
Hemoglobin: 14.6 g/dL (ref 13.0–17.0)
MCH: 31.1 pg (ref 26.0–34.0)
MCHC: 33.6 g/dL (ref 30.0–36.0)
MCV: 92.8 fL (ref 80.0–100.0)
Platelets: 255 10*3/uL (ref 150–400)
RBC: 4.69 MIL/uL (ref 4.22–5.81)
RDW: 12.8 % (ref 11.5–15.5)
WBC: 10.8 10*3/uL — ABNORMAL HIGH (ref 4.0–10.5)
nRBC: 0 % (ref 0.0–0.2)

## 2018-12-09 LAB — POCT I-STAT 7, (LYTES, BLD GAS, ICA,H+H)
Acid-base deficit: 1 mmol/L (ref 0.0–2.0)
Bicarbonate: 22.2 mmol/L (ref 20.0–28.0)
Calcium, Ion: 1.28 mmol/L (ref 1.15–1.40)
HCT: 46 % (ref 39.0–52.0)
Hemoglobin: 15.6 g/dL (ref 13.0–17.0)
O2 Saturation: 97 %
Patient temperature: 99.2
Potassium: 3.6 mmol/L (ref 3.5–5.1)
Sodium: 141 mmol/L (ref 135–145)
TCO2: 23 mmol/L (ref 22–32)
pCO2 arterial: 34.1 mmHg (ref 32.0–48.0)
pH, Arterial: 7.423 (ref 7.350–7.450)
pO2, Arterial: 88 mmHg (ref 83.0–108.0)

## 2018-12-09 LAB — PROTIME-INR
INR: 1 (ref 0.8–1.2)
Prothrombin Time: 13.5 seconds (ref 11.4–15.2)

## 2018-12-09 LAB — URINALYSIS, ROUTINE W REFLEX MICROSCOPIC
Bilirubin Urine: NEGATIVE
Glucose, UA: 50 mg/dL — AB
Ketones, ur: 5 mg/dL — AB
Leukocytes,Ua: NEGATIVE
Nitrite: NEGATIVE
Protein, ur: 30 mg/dL — AB
Specific Gravity, Urine: 1.025 (ref 1.005–1.030)
pH: 7 (ref 5.0–8.0)

## 2018-12-09 LAB — HEPATIC FUNCTION PANEL
ALT: 30 U/L (ref 0–44)
AST: 38 U/L (ref 15–41)
Albumin: 4.2 g/dL (ref 3.5–5.0)
Alkaline Phosphatase: 58 U/L (ref 38–126)
Bilirubin, Direct: 0.2 mg/dL (ref 0.0–0.2)
Indirect Bilirubin: 0.8 mg/dL (ref 0.3–0.9)
Total Bilirubin: 1 mg/dL (ref 0.3–1.2)
Total Protein: 6.8 g/dL (ref 6.5–8.1)

## 2018-12-09 LAB — SARS CORONAVIRUS 2: SARS Coronavirus 2: NOT DETECTED

## 2018-12-09 LAB — APTT: aPTT: 26 seconds (ref 24–36)

## 2018-12-09 LAB — MRSA PCR SCREENING: MRSA by PCR: NEGATIVE

## 2018-12-09 LAB — ECHOCARDIOGRAM COMPLETE
Height: 68 in
Weight: 2701.96 oz

## 2018-12-09 LAB — SODIUM
Sodium: 140 mmol/L (ref 135–145)
Sodium: 139 mmol/L (ref 135–145)
Sodium: 138 mmol/L (ref 135–145)
Sodium: 138 mmol/L (ref 135–145)

## 2018-12-09 LAB — ETHANOL: Alcohol, Ethyl (B): <10 mg/dL (ref <10)

## 2018-12-09 LAB — HIV ANTIBODY (ROUTINE TESTING W REFLEX): HIV Screen 4th Generation wRfx: NONREACTIVE

## 2018-12-09 MED ORDER — ACETAMINOPHEN 160 MG/5ML PO SOLN: 650.0000 mg | ORAL | Status: DC | PRN

## 2018-12-09 MED ORDER — PANTOPRAZOLE SODIUM 40 MG IV SOLR: 40.0000 mg | Freq: Every day | INTRAVENOUS | Status: DC

## 2018-12-09 MED ORDER — POTASSIUM CHLORIDE 10 MEQ/100ML IV SOLN
10.0000 meq | INTRAVENOUS | Status: DC
  Administered 2018-12-09: 12:00:00 10 meq via INTRAVENOUS
  Filled 2018-12-09 (×6): qty 100

## 2018-12-09 MED ORDER — LABETALOL HCL 5 MG/ML IV SOLN: 20.0000 mg | Freq: Once | INTRAVENOUS | Status: DC

## 2018-12-09 MED ORDER — POTASSIUM CHLORIDE CRYS ER 20 MEQ PO TBCR
40.0000 meq | EXTENDED_RELEASE_TABLET | ORAL | Status: AC
  Administered 2018-12-09 (×2): 40 meq via ORAL
  Filled 2018-12-09 (×2): qty 2

## 2018-12-09 MED ORDER — CHLORHEXIDINE GLUCONATE CLOTH 2 % EX PADS: 6.0000 | MEDICATED_PAD | Freq: Every day | CUTANEOUS | Status: DC

## 2018-12-09 MED ORDER — LEVETIRACETAM IN NACL 500 MG/100ML IV SOLN
500.0000 mg | Freq: Once | INTRAVENOUS | Status: AC
  Administered 2018-12-09: 02:00:00 500 mg via INTRAVENOUS
  Filled 2018-12-09: qty 100

## 2018-12-09 MED ORDER — CLEVIDIPINE BUTYRATE 0.5 MG/ML IV EMUL: 0.0000 mg/h | INTRAVENOUS | Status: DC

## 2018-12-09 MED ORDER — ACETAMINOPHEN 10 MG/ML IV SOLN
1000.0000 mg | Freq: Four times a day (QID) | INTRAVENOUS | Status: AC
  Administered 2018-12-09 – 2018-12-10 (×4): 1000 mg via INTRAVENOUS
  Filled 2018-12-09 (×4): qty 100

## 2018-12-09 MED ORDER — LORAZEPAM 2 MG/ML IJ SOLN
1.0000 mg | Freq: Once | INTRAMUSCULAR | Status: AC
  Administered 2018-12-10: 03:00:00 1 mg via INTRAVENOUS
  Filled 2018-12-09: qty 1

## 2018-12-09 MED ORDER — STROKE: EARLY STAGES OF RECOVERY BOOK
Freq: Once | Status: DC
  Filled 2018-12-09: qty 1

## 2018-12-09 MED ORDER — SENNOSIDES-DOCUSATE SODIUM 8.6-50 MG PO TABS
1.0000 | ORAL_TABLET | Freq: Two times a day (BID) | ORAL | Status: DC
  Administered 2018-12-10 – 2018-12-19 (×18): 1 via ORAL
  Filled 2018-12-09 (×20): qty 1

## 2018-12-09 MED ORDER — LEVETIRACETAM IN NACL 1500 MG/100ML IV SOLN
1500.0000 mg | Freq: Once | INTRAVENOUS | Status: AC
  Administered 2018-12-09: 01:00:00 1500 mg via INTRAVENOUS
  Filled 2018-12-09: qty 100

## 2018-12-09 MED ORDER — ACETAMINOPHEN 650 MG RE SUPP: 650.0000 mg | RECTAL | Status: DC | PRN

## 2018-12-09 MED ORDER — SODIUM CHLORIDE 3 % IV SOLN
INTRAVENOUS | Status: AC
  Administered 2018-12-09 – 2018-12-10 (×3): 50 mL/h via INTRAVENOUS
  Filled 2018-12-09 (×3): qty 500

## 2018-12-09 MED ORDER — PROMETHAZINE HCL 25 MG/ML IJ SOLN
25.0000 mg | Freq: Once | INTRAMUSCULAR | Status: AC
  Administered 2018-12-09 – 2018-12-10 (×2): 12.5 mg via INTRAVENOUS
  Filled 2018-12-09: qty 1

## 2018-12-09 MED ORDER — CHLORHEXIDINE GLUCONATE CLOTH 2 % EX PADS
6.0000 | MEDICATED_PAD | Freq: Every day | CUTANEOUS | Status: DC
  Administered 2018-12-10 – 2018-12-19 (×6): 6 via TOPICAL

## 2018-12-09 MED ORDER — ACETAMINOPHEN 325 MG PO TABS
650.0000 mg | ORAL_TABLET | ORAL | Status: DC | PRN
  Administered 2018-12-10 – 2018-12-18 (×5): 650 mg via ORAL
  Filled 2018-12-09 (×5): qty 2

## 2018-12-09 MED ORDER — LORAZEPAM 2 MG/ML IJ SOLN
1.0000 mg | Freq: Once | INTRAMUSCULAR | Status: AC
  Administered 2018-12-10: 14:00:00 2 mg via INTRAVENOUS
  Filled 2018-12-09: qty 1

## 2018-12-09 MED ORDER — LEVETIRACETAM IN NACL 1500 MG/100ML IV SOLN
1500.0000 mg | Freq: Once | INTRAVENOUS | Status: DC
  Filled 2018-12-09: qty 100

## 2018-12-09 MED ORDER — SODIUM CHLORIDE 0.9 % IV SOLN
2000.0000 mg | Freq: Once | INTRAVENOUS | Status: DC
  Filled 2018-12-09: qty 20

## 2018-12-09 MED ORDER — PANTOPRAZOLE SODIUM 40 MG IV SOLR
40.0000 mg | Freq: Two times a day (BID) | INTRAVENOUS | Status: DC
  Administered 2018-12-09 – 2018-12-10 (×2): 40 mg via INTRAVENOUS
  Filled 2018-12-09 (×2): qty 40

## 2018-12-09 MED ORDER — PANTOPRAZOLE SODIUM 40 MG IV SOLR: 40.0000 mg | Freq: Two times a day (BID) | INTRAVENOUS | Status: DC

## 2018-12-09 MED ORDER — SODIUM CHLORIDE 0.9 % IV SOLN
750.0000 mg | Freq: Two times a day (BID) | INTRAVENOUS | Status: DC
  Administered 2018-12-09 – 2018-12-12 (×5): 750 mg via INTRAVENOUS
  Filled 2018-12-09 (×7): qty 7.5

## 2018-12-09 NOTE — Progress Notes (Signed)
  Echocardiogram 2D Echocardiogram has been performed.  Walter Grimes 12/09/2018, 3:05 PM

## 2018-12-09 NOTE — Progress Notes (Signed)
RT placed patient on BIPAP 12/6, back up rate: 10, 30% FIO2. Patient tolerated BIPAP for 2 minutes before taking it off stating "my head hurts" "I have heartburn" While on the BIPAP patient went apneic. CCM MD, CCM NP & RN were all made aware of the patient's complaints and that he did not tolerate BIPAP.

## 2018-12-09 NOTE — ED Notes (Signed)
RN spoke with girl friend, pt was picking up girlfriend when he texted her that he was not feeling well, gf went outside and saw pt was on the ground stating he was having a heart attack and vomited at that time. Pt then was helped to the car and was put in the car and still talking when he started to seizure per girlfriend that his whole body was twitching and not responding for 3 minutes per girlfriend then pt woke up and ems was called by a coworker that was at the girlfriends job. When ems arrived pt was still twitching and ems told girlfriend that pt was still seizing but for girlfriend stated that pt did not look like he was seizing any more but just twitching and more alert.

## 2018-12-09 NOTE — Progress Notes (Addendum)
On arrival to 4N after MRI, the patient started projectile vomiting and became difficulty to arouse.   Neuro exam: Ment: Obtunded. Not opening eyes to noxious stimuli or responding to voice CN: Right pupil 7-8 mm, reactive sluggishly to 5 mm, worsened since prior exam. Left pupil 4 mm, reactive to 3 mm.  Face without grimace to noxious.  Motor: Will not move extremities to sternal rub. No posturing or adventitious movements noted.  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  -- Will obtain STAT repeat CT as worsening occurred shortly after MRI. He had not decompensated at the time of the MRI, which reveals stable hemorrhage at that time.  -- Continue 3% saline -- If repeat CT head shows no change, will obtain STAT EEG to rule out nonconvulsive status epilepticus  Additional 15 minutes of ICU time.   Addendum: STAT CT head completed. Preliminary review with comparison to prior shows no interval change. Clinical worsening now more likely to be due to nonconvulsive status given unchanged CT. EEG technician has been called for STAT EEG.   Electronically signed: Dr. Kerney Elbe

## 2018-12-09 NOTE — Progress Notes (Signed)
Pt order is for PRN BIPAP at this time and pt is not in need of as his respiratory status is stable currently. RT will continue to monitor.

## 2018-12-09 NOTE — Social Work (Signed)
CSW acknowledging consult for substance use, pt unable to participate in therapy services per note. Will follow for medical appropriateness for evaluation.  Westley Hummer, MSW, [place] Work [phone]

## 2018-12-09 NOTE — Progress Notes (Signed)
EEG complete - results pending 

## 2018-12-09 NOTE — Progress Notes (Signed)
PT Cancellation Note  Patient Details Name: Walter Grimes MRN: [id number] DOB: 1991/02/28   Cancelled Treatment:    Reason Eval/Treat Not Completed: Active bedrest order Will follow.   Walter Grimes 12/09/2018, 7:03 AM  Wray Kearns, PT, DPT Acute Rehabilitation Services Pager [phone] Office [phone]

## 2018-12-09 NOTE — Progress Notes (Signed)
SLP Cancellation Note  Patient Details Name: Walter Grimes MRN: [id number] DOB: 02-07-1991   Cancelled treatment:       Reason Eval/Treat Not Completed: Patient not medically ready;Fatigue/lethargy limiting ability to participate(Pt was approached for speech/language/cognition evaluation but was unable to maintain alertness for long enough periods to participate in the evaluation. SLP will follow up and re-attempt evaluation on a subsequent date unless contacted sooner by [name], RN due to improvement in his status)  [name] I. [name], [place], [place] Office number [phone] Pager [place] 12/09/2018, 9:29 AM

## 2018-12-09 NOTE — Procedures (Signed)
  [place] A. Merlene Laughter, MD     [email or web address]           HISTORY: This is a 28 year old who presents with seizures and intracranial hemorrhage.  MEDICATIONS:  Current Facility-Administered Medications:  .   stroke: mapping our early stages of recovery book, , Does not apply, Once, Kerney Elbe, MD .  acetaminophen (OFIRMEV) IV 1,000 mg, 1,000 mg, Intravenous, Q6H, Kerney Elbe, MD, Last Rate: 160 mL/hr at 12/09/18 1155, 1,000 mg at 12/09/18 1155 .  acetaminophen (TYLENOL) tablet 650 mg, 650 mg, Oral, Q4H PRN **OR** acetaminophen (TYLENOL) solution 650 mg, 650 mg, Per Tube, Q4H PRN **OR** acetaminophen (TYLENOL) suppository 650 mg, 650 mg, Rectal, Q4H PRN, Kerney Elbe, MD .  Chlorhexidine Gluconate Cloth 2 % PADS 6 each, 6 each, Topical, Daily, Kerney Elbe, MD .  labetalol (NORMODYNE) injection 20 mg, 20 mg, Intravenous, Once, Stopped at 12/09/18 0519 **AND** clevidipine (CLEVIPREX) infusion 0.5 mg/mL, 0-21 mg/hr, Intravenous, Continuous, Kerney Elbe, MD, Stopped at 12/09/18 0453 .  levETIRAcetam (KEPPRA) 750 mg in sodium chloride 0.9 % 100 mL IVPB, 750 mg, Intravenous, Q12H, Kerney Elbe, MD, Last Rate: 430 mL/hr at 12/09/18 1209, 750 mg at 12/09/18 1209 .  pantoprazole (PROTONIX) injection 40 mg, 40 mg, Intravenous, Q12H, Rosalin Hawking, MD .  potassium chloride 10 mEq in 100 mL IVPB, 10 mEq, Intravenous, Q1 Hr x 6, Rosalin Hawking, MD, Last Rate: 100 mL/hr at 12/09/18 1229, 10 mEq at 12/09/18 1229 .  senna-docusate (Senokot-S) tablet 1 tablet, 1 tablet, Oral, BID, Kerney Elbe, MD .  sodium chloride (hypertonic) 3 % solution, , Intravenous, Continuous, Kerney Elbe, MD, Last Rate: 50 mL/hr at 12/09/18 0800     ANALYSIS: A 16 channel recording using standard 10 20 measurements is conducted for 22 minutes.   The background activity is 10 hertz.  There is beta activity observed in the frontal areas.  Awake and drowsy architecture are observed.  There is a couple  of shaking suffering spells not associated with electrographic seizures.  Photic stimulation and hyperventilation were not conducted.  There is no focal or lateralized slowing.  There is no epileptiform activity is observed.  There is normal myogenic interference noted.   IMPRESSION: 1.   This is a normal recording of awake and drowsy states.      Abhiram Criado A. Merlene Laughter, M.D.  Diplomate, Tax adviser of Psychiatry and Neurology ( Neurology).

## 2018-12-09 NOTE — Consult Note (Addendum)
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  He has developed cheyne stokes respirations with long periods of apnea. 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 There was concern for acute  extension of the hemorrhage. CT Head confirmed a slight increase in left parietal parenchymal hemorrhage from 5.4 to 5.8 cm in AP diameter as measured on the sagittal reformatted images.Stable mass effect with 4 mm midline shift. He has subsequently developed Cheyne Stokes respirations with long periods of Apnea which resolve with awakening the patient. He has developed progressive somnolence. PCCM have been asked to consult and evaluate and treat what appears to be a new central apnea.   Past Medical History   History reviewed. No pertinent past medical history.  Current every day smoker Some Marijuana use  Significant Hospital Events   12/09/2018 Admission  Consults:  6/16 PCCM  Procedures:    Significant Diagnostic Tests:  12/09/2018 CT Head without Contrast 5.8 x 2.2 cm left parietal hemorrhage with intraventricular Extension.  12/09/2018 CT Head WO Slight increase in left parietal parenchymal hemorrhage from 5.4 to 5.8 cm in AP diameter as measured on the sagittal reformatted images. Hemorrhage is otherwise stable including involvement of the corpus callosum and extension into the ventricles. No hydrocephalus. Stable mass effect with 4 mm midline shift. No new hemorrhage.  12/08/2018 MRI Brain WO Contrast Stable acute hemorrhage within left medial parietal  lobe extending into corpus callosum. Stable intraventricular hemorrhage greatest in left lateral ventricle. Trace volume of subarachnoid hemorrhage likely due to redistribution. Stable associated mass effect with 4 mm left-to-right midline shift. No clustered flow voids to indicate vascular nidus identified.  Micro Data:  12/09/2018 SARS Coronavirus Negative   Antimicrobials:  None  Interim history/subjective:  Pt. Has become increasingly  somnolent with increasing periods of apnea. He does initiate breathing with stimulation.  Objective   Blood pressure (!) 125/53, pulse 70, temperature 99.2 F (37.3 C), temperature source Oral, resp. rate (!) 24, height 5\' 8"  (1.727 m), weight 76.6 kg, SpO2 98 %.        Intake/Output Summary (Last 24 hours) at 12/09/2018 1041 Last data filed at 12/09/2018 0800 Gross per 24 hour  Intake 475.86 ml  Output 200 ml  Net 275.86 ml   Filed Weights   12/09/18 0342  Weight: 76.6 kg    Examination: General: Thin male, somnolent, follows commands at intervals HENT: NCAT, No LAD, MM Pink and ,moist, No LAD Lungs: Bilateral chest excursion, Clear throughout Cardiovascular: S1, S2, RRR, No RGM, NSR per Tele Abdomen: Soft, Non tender, ND, BS +, No organomegally Extremities: No obvious deformities noted, No mottling, brick refill noted Neuro: Somnolent, follows commands intermittently, emotionally labile, MAE x 4   Resolved Hospital Problem list     Assessment & Plan:  Acute Respiratory Failure in setting of  new onset seizure and 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 Central Apneas/ Cheyne Stokes Respirations  lasting > 45 seconds Maintaining oxygen saturations High risk aspiration with seizure pre- hospitalization Concern for airway protection Plan ABG now>> reassuring BiPap  per RT settings CXR now>> Clear Avoid sedation Maintain sats of > 92% Keep HOB elevated > 30 degrees May need intubation if BiPAP fails   Pt, is maintaining his oxygenation and for now he is protecting his airway. We will continue to monitor closely.  Best practice:  Diet: NPO Pain/Anxiety/Delirium protocol (if indicated):  VAP protocol (if indicated): Yes DVT prophylaxis: PAS  GI prophylaxis: Protonix Glucose control: CBG/ SSI Mobility: BR Code Status: Full Family Communication: Per Nursing Disposition: ICU  Labs   CBC: Recent Labs  Lab 12/08/18 2337  WBC 10.8*  HGB 14.6  HCT 43.5  MCV 92.8  PLT 255    Basic Metabolic Panel: Recent Labs  Lab 12/08/18 2337 12/09/18 0221 12/09/18 0756  NA 141 140 139  K 3.2*  --   --   CL 106  --   --   CO2 25  --   --   GLUCOSE 144*  --   --   BUN 10  --   --   CREATININE 1.22  --   --   CALCIUM 9.3  --   --    GFR: Estimated Creatinine Clearance: 86.4 mL/min (by C-G formula based on SCr of 1.22 mg/dL). Recent Labs  Lab 12/08/18 2337  WBC 10.8*    Liver Function Tests: Recent Labs  Lab 12/08/18 2337  AST 38  ALT 30  ALKPHOS 58  BILITOT 1.0  PROT 6.8  ALBUMIN 4.2   No results for input(s): LIPASE, AMYLASE in the last 168 hours. No results for input(s): AMMONIA in the last 168 hours.  ABG    Component Value Date/Time   PHART 7.375 07/21/2018 1839   PCO2ART 41.5 07/21/2018 1839   PO2ART 109.0 (H) 07/21/2018 1839   HCO3 24.5 09/30/2018 0319   TCO2 25 09/30/2018 0319   ACIDBASEDEF 1.0 07/21/2018 1839   O2SAT 67.0 09/30/2018 0319     Coagulation Profile: Recent Labs  Lab 12/09/18 0111  INR 1.0    Cardiac Enzymes: No results for input(s): CKTOTAL, CKMB, CKMBINDEX, TROPONINI in the last 168 hours.  HbA1C: No results found for: HGBA1C  CBG: Recent Labs  Lab 12/08/18 2339  GLUCAP 117*    Review of Systems:   Unable as patient somnolent  Past Medical History  He,  has no past medical history on file.   Surgical History   History reviewed. No pertinent surgical history.   Social History   reports that he has been smoking  cigarettes. He has been smoking about 1.00 pack per day. He has never used smokeless tobacco. He reports current alcohol use. He reports current drug use. Drug: Marijuana.   Family History   His family history is not on file.   Allergies No Known Allergies   Home Medications  Prior to Admission medications   Not on File     Critical care time: 40 minutes      Bevelyn NgoSarah F. Nicolasa Milbrath, AGACNP-BC [facility] Pulmonary/Critical Care Medicine Pager # [phone] After 4 pm call [phone] 12/09/2018 10:41 AM

## 2018-12-09 NOTE — Progress Notes (Signed)
Attending note: I have seen and examined the patient. History, labs and imaging reviewed.  28 year old with no past medical history admitted with new onset seizure, left frontoparietal intracranial hemorrhage. PCCM consulted 6/16 for periods of apnea.  Blood pressure (!) 125/53, pulse 70, temperature 99.2 F (37.3 C), temperature source Oral, resp. rate (!) 24, height 5\' 8"  (1.727 m), weight 76.6 kg, SpO2 98 %. Gen:      No acute distress HEENT:  EOMI, sclera anicteric Neck:     No masses; no thyromegaly Lungs:    Clear to auscultation bilaterally; normal respiratory effort CV:         Regular rate and rhythm; no murmurs Abd:      + bowel sounds; soft, non-tender; no palpable masses, no distension Ext:    No edema; adequate peripheral perfusion Skin:      Warm and dry; no rash Neuro: Somnolent, arousable  Labs reviewed, significant for Sodium 141, potassium 3.2, BUN/creatinine 10/1.22 WBC 10.8, hemoglobin 14.6, platelets 255  Imaging CT head 6/16-left pareital intraparenchymal hemorrhage with slight increase in size.  Extension of hemorrhage into the ventricle.  No hydrocephalus.  4 mm midline shift.  Assessment/plan: 28 year old with intracranial hemorrhage, seizures, apneic episodes  Apnea Appears to have central apnea, possible Cheyne-Stokes secondary to intracranial hemorrhage, seizures Currently protecting airway.  His ABG is normal Chest x-ray looks okay Try BiPAP We will monitor closely for intubation needs  ICH, seizures Management per neurology  The patient is critically ill with multiple organ systems failure and requires high complexity decision making for assessment and support, frequent evaluation and titration of therapies, application of advanced monitoring technologies and extensive interpretation of multiple databases.  Critical care time - 35 mins. This represents my time independent of the NPs time taking care of the pt.  Marshell Garfinkel MD [place]  Pulmonary and Critical Care 12/09/2018, 11:19 AM

## 2018-12-09 NOTE — Progress Notes (Signed)
Pt arrived on unit from ED. Began projectile vomiting. Pt difficult to arouse. Dr. Cheral Marker made aware.  Belongings assessment: Wet clothes. (pants with belt, shoes, shirt, windbreaker), $300 in pants (will send to be locked up), phone charger and one key.

## 2018-12-09 NOTE — Progress Notes (Signed)
OT Cancellation Note  Patient Details Name: KAISEN ACKERS MRN: [id number] DOB: Jul 02, 1990   Cancelled Treatment:    Reason Eval/Treat Not Completed: Active bedrest order. Will return as schedule allows. Thank you.  [place], OTR/L Acute Rehab Pager: [phone] Office: [phone] 12/09/2018, 7:26 AM

## 2018-12-09 NOTE — Progress Notes (Signed)
Repeat CT head at 1015, for a 6 hour scan interval given slight extension of the size of the ICH on the 0414 CT per official Radiology report.   Electronically signed: Dr. Kerney Elbe

## 2018-12-09 NOTE — Progress Notes (Addendum)
STROKE TEAM PROGRESS NOTE   INTERVAL HISTORY Pt lying in bed, sleepy but arousable. When pt goes sleepy, he has sleep apnea likely central apnea. When he woke up he will have to have hyperventilation which made him anxious and scared. CCM consulted and ordered BIPAP but pt not able to tolerate. He also vomited bloody vomitus, increased protonix to bid, concerning for stress ulcer. Pt also crying and emotional likely due to left sided hemiplegia. He also passed swallow. Currently on 3% saline and Na between 138 and 141.    Vitals:   12/09/18 0500 12/09/18 0600 12/09/18 0700 12/09/18 0800  BP: (!) 117/59 115/75 111/60 (!) 125/53  Pulse: 68 61 76 70  Resp: (!) 23 20 (!) 21 (!) 24  Temp:    99.2 F (37.3 C)  TempSrc:    Oral  SpO2: 100% 100% 97% 98%  Weight:      Height:        CBC:  Recent Labs  Lab 12/08/18 2337  WBC 10.8*  HGB 14.6  HCT 43.5  MCV 92.8  PLT 255    Basic Metabolic Panel:  Recent Labs  Lab 12/08/18 2337 12/09/18 0221 12/09/18 0756  NA 141 140 139  K 3.2*  --   --   CL 106  --   --   CO2 25  --   --   GLUCOSE 144*  --   --   BUN 10  --   --   CREATININE 1.22  --   --   CALCIUM 9.3  --   --    Lipid Panel: No results found for: CHOL, TRIG, HDL, CHOLHDL, VLDL, LDLCALC HgbA1c: No results found for: HGBA1C Urine Drug Screen:     Component Value Date/Time   LABOPIA NONE DETECTED 12/09/2018 0456   COCAINSCRNUR NONE DETECTED 12/09/2018 0456   LABBENZ NONE DETECTED 12/09/2018 0456   AMPHETMU NONE DETECTED 12/09/2018 0456   THCU POSITIVE (A) 12/09/2018 0456   LABBARB NONE DETECTED 12/09/2018 0456    Alcohol Level     Component Value Date/Time   ETH <10 12/08/2018 2339    IMAGING Ct Head Wo Contrast  Result Date: 12/09/2018 CLINICAL DATA:  Intracranial hemorrhage. Change in mental status. Patient has become less responsive. EXAM: CT HEAD WITHOUT CONTRAST TECHNIQUE: Contiguous axial images were obtained from the base of the skull through the vertex  without intravenous contrast. COMPARISON:  MRI brain 12/09/2018 at 3:15 a.m. FINDINGS: Brain: Previously noted left parietal parenchymal hemorrhage demonstrates slight increase in size when measured in AP diameter on the sagittal reformatted images. Maximal dimension is now 5.8 cm, compared with 5.4 cm on the prior study. Midline shift is stable at 4 mm. Hemorrhage within the corpus callosum and ventricles is stable. There is no hydrocephalus. No new areas of hemorrhage are present. Vascular: No hyperdense vessel or unexpected calcification. Skull: Calvarium is intact. No focal lytic or blastic lesions are present. No significant extracranial soft tissue injury is present. Sinuses/Orbits: A polyp or mucous retention cyst is again noted posteriorly in the left maxillary sinus. The paranasal sinuses and mastoid air cells are otherwise clear. Globes demonstrate dysconjugate gaze. This may be within normal limits. Globes and orbits are otherwise within normal limits. Other: IMPRESSION: 1. Slight increase in left parietal parenchymal hemorrhage from 5.4 to 5.8 cm in AP diameter as measured on the sagittal reformatted images. 2. Hemorrhage is otherwise stable including involvement of the corpus callosum and extension into the ventricles. 3. No hydrocephalus. 4.  Stable mass effect with 4 mm midline shift. 5. No new hemorrhage. The above was relayed via text pager to Dr. Kerney Elbe on 12/09/2018 at 04:47 . Electronically Signed   By: San Morelle M.D.   On: 12/09/2018 04:47   Ct Head Wo Contrast  Result Date: 12/09/2018 CLINICAL DATA:  Altered mental status, possible seizure. EXAM: CT HEAD WITHOUT CONTRAST TECHNIQUE: Contiguous axial images were obtained from the base of the skull through the vertex without intravenous contrast. COMPARISON:  05/05/2015 FINDINGS: Brain: There is a left parietal hemorrhage measuring 5.8 x 2.2 cm which extends to the lateral ventricle with intraventricular hemorrhage. Blood noted  within the lateral ventricles, 3rd ventricle and 4th ventricle. No hydrocephalus. No midline shift. Vascular: No hyperdense vessel or unexpected calcification. Skull: No acute calvarial abnormality. Sinuses/Orbits: Visualized paranasal sinuses and mastoids clear. Orbital soft tissues unremarkable. Other: None IMPRESSION: 5.8 x 2.2 cm left parietal hemorrhage with intraventricular extension. Critical Value/emergent results were called by telephone at the time of interpretation on 12/09/2018 at 12:22 am to Dr. Mariane Baumgarten, who verbally acknowledged these results. Electronically Signed   By: Rolm Baptise M.D.   On: 12/09/2018 00:27   Mr Virgel Paling HY Contrast  Result Date: 12/09/2018 CLINICAL DATA:  28 y/o  M; intracranial hemorrhage for follow-up. EXAM: MRI HEAD WITHOUT CONTRAST MRA HEAD WITHOUT CONTRAST TECHNIQUE: MRI of the brain with axial DWI, sagittal T1, axial T2 blade, axial FLAIR blade sequences were acquired. MRA time-of-flight was acquired in the axial plane with multiplanar MIP reconstruction. COMPARISON:  12/09/2018 CT head FINDINGS: MRI HEAD FINDINGS Brain: Stable T1 isointense and T2 hyperintense acute hemorrhage centered within the left medial parietal lobe extending into corpus callosum measuring 58 x 21 mm (AP by ML series 15, image 20). Stable volume of intraventricular hemorrhage throughout the ventricular system, greatest in left lateral ventricle. Associated mass effect with 4 mm of left-to-right midline shift. Mildly increased T2 FLAIR signal within the quadrigeminal plate cistern and diffuse sulci predominantly over left posterior convexity his compatible subarachnoid hemorrhage, likely due to redistribution. No downward herniation at this time. Vascular: Normal flow voids. No vascular nidus or clustered flow voids identified. Skull and upper cervical spine: Normal marrow signal. Sinuses/Orbits: Left maxillary sinus mucous retention cyst. Additional included paranasal sinuses and mastoid air cells  demonstrate normal signal. Orbits are normal. Other: None. MRA HEAD FINDINGS Internal carotid arteries:  Patent. Anterior cerebral arteries:  Patent. Middle cerebral arteries: Patent. Anterior communicating artery: Not identified, likely hypoplastic or absent. Posterior communicating arteries:  Patent. Posterior cerebral arteries:  Patent. Basilar artery:  Patent. Vertebral arteries:  Patent. No evidence of high-grade stenosis, large vessel occlusion, or aneurysm. No high flow vascular lesion is identified within the field of view, however, the superior aspect of the hematoma is above the field of view. IMPRESSION: 1. Stable acute hemorrhage within left medial parietal lobe extending into corpus callosum. Stable intraventricular hemorrhage greatest in left lateral ventricle. Trace volume of subarachnoid hemorrhage likely due to redistribution. Stable associated mass effect with 4 mm left-to-right midline shift. No clustered flow voids to indicate vascular nidus identified. 2. Otherwise unremarkable MRI of the brain. 3. Normal MRA of the head. No high flow vascular lesion within the field of view, however, the superior aspect of the hematoma is above the field of view. Electronically Signed   By: Kristine Garbe M.D.   On: 12/09/2018 03:57   Mr Brain Wo Contrast  Result Date: 12/09/2018 CLINICAL DATA:  28 y/o  M; intracranial  hemorrhage for follow-up. EXAM: MRI HEAD WITHOUT CONTRAST MRA HEAD WITHOUT CONTRAST TECHNIQUE: MRI of the brain with axial DWI, sagittal T1, axial T2 blade, axial FLAIR blade sequences were acquired. MRA time-of-flight was acquired in the axial plane with multiplanar MIP reconstruction. COMPARISON:  12/09/2018 CT head FINDINGS: MRI HEAD FINDINGS Brain: Stable T1 isointense and T2 hyperintense acute hemorrhage centered within the left medial parietal lobe extending into corpus callosum measuring 58 x 21 mm (AP by ML series 15, image 20). Stable volume of intraventricular hemorrhage  throughout the ventricular system, greatest in left lateral ventricle. Associated mass effect with 4 mm of left-to-right midline shift. Mildly increased T2 FLAIR signal within the quadrigeminal plate cistern and diffuse sulci predominantly over left posterior convexity his compatible subarachnoid hemorrhage, likely due to redistribution. No downward herniation at this time. Vascular: Normal flow voids. No vascular nidus or clustered flow voids identified. Skull and upper cervical spine: Normal marrow signal. Sinuses/Orbits: Left maxillary sinus mucous retention cyst. Additional included paranasal sinuses and mastoid air cells demonstrate normal signal. Orbits are normal. Other: None. MRA HEAD FINDINGS Internal carotid arteries:  Patent. Anterior cerebral arteries:  Patent. Middle cerebral arteries: Patent. Anterior communicating artery: Not identified, likely hypoplastic or absent. Posterior communicating arteries:  Patent. Posterior cerebral arteries:  Patent. Basilar artery:  Patent. Vertebral arteries:  Patent. No evidence of high-grade stenosis, large vessel occlusion, or aneurysm. No high flow vascular lesion is identified within the field of view, however, the superior aspect of the hematoma is above the field of view. IMPRESSION: 1. Stable acute hemorrhage within left medial parietal lobe extending into corpus callosum. Stable intraventricular hemorrhage greatest in left lateral ventricle. Trace volume of subarachnoid hemorrhage likely due to redistribution. Stable associated mass effect with 4 mm left-to-right midline shift. No clustered flow voids to indicate vascular nidus identified. 2. Otherwise unremarkable MRI of the brain. 3. Normal MRA of the head. No high flow vascular lesion within the field of view, however, the superior aspect of the hematoma is above the field of view. Electronically Signed   By: Mitzi HansenLance  Furusawa-Stratton M.D.   On: 12/09/2018 03:57    PHYSICAL EXAM  Temp:  [97.8 F (36.6  C)-99.4 F (37.4 C)] 99.4 F (37.4 C) (06/16 1557) Pulse Rate:  [54-91] 65 (06/16 1600) Resp:  [0-32] 17 (06/16 1600) BP: (104-126)/(53-83) 114/62 (06/16 1600) SpO2:  [97 %-100 %] 100 % (06/16 1600) Weight:  [76.6 kg] 76.6 kg (06/16 0342)  General - Well nourished, well developed, crying and emotional with sleepy apnea, ? central.  Ophthalmologic - fundi not visualized due to noncooperation.  Cardiovascular - Regular rate and rhythm.  Mental Status -  Level of arousal and orientation to place, and person were intact, but not to time. Language including expression, naming, repetition, comprehension was assessed and found intact.  Cranial Nerves II - XII - II - Visual field intact OU. III, IV, VI - Extraocular movements intact. V - Facial sensation intact bilaterally. VII - Facial movement intact bilaterally. VIII - Hearing & vestibular intact bilaterally. X - Palate elevates symmetrically. XI - Chin turning & shoulder shrug intact bilaterally. XII - Tongue protrusion intact.  Motor Strength - The patient's strength was normal in Left UE and LE, however, RUE 0/5 proximal, bicep or tricep but able to move index finger 3/5. [name] 0/5 proximal and distal.  Bulk was normal and fasciculations were absent.   Motor Tone - Muscle tone was assessed at the neck and appendages and was normal.  Reflexes -  The patient's reflexes were symmetrical in all extremities and he had no pathological reflexes.  Sensory - Light touch, temperature/pinprick were assessed and were decreased on the right.    Coordination - The patient had normal movements in the left hand with no ataxia or dysmetria.  Tremor was absent.  Gait and Station - deferred.   ASSESSMENT/PLAN Mr. Estil Daftevin J Kath is a 28 y.o. male with no significant past medical history presenting post seizure with L parietal frontal ICH seen on CT.   New onset Seizure - ?? secondary to ICH ??  Possible seizure 2 weeks ago  keppra 2000 mg  load followed by 750 bid  EEG normal  ICH:  L parietal frontal ICH w/ IVH - etiology unclear  CT head 5.8 x 2.2 cm L parietal ICH with IVH  MRI stable L medial parietal ICH extending into the corpus callosum.  Stable L IVH.  Trace SAH.  Stable 4 mm L to R shift.  MRA Unremarkable   Repeat CT head slight increase L IPH from 5.4 to 5.8 cm in AP diameter.  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 Stress ulcer ??  Bloody vomitus  Concerning for stress ulcer  On protonix IV bid   CCM on board  Monitoring CBC  Tobacco abuse  Current smoker  Smoking cessation counseling provided  Pt is willing to quit   Other Stroke Risk Factors  ETOH use, advised to drink no more than 2 drink(s) a day  Marijuana use w/ UDS positive for THC - education provided  Hypokalemia  K 3.2   Replaced  Recheck in am  Hospital day # 0  This patient is critically ill due to ICH, IVH, new onset seizure, stress ulcer, cerebral edema and central sleep apnea and at significant risk of neurological worsening, death form respiratory failure, hematoma expansion, obstructive hydrocephalus, cerebral edema, brain herniation, status epilepticus and GI bleeding. This patient's care requires constant monitoring of vital signs, hemodynamics, respiratory and cardiac monitoring, review of multiple databases, neurological assessment, discussion with family, other specialists and medical  decision making of high complexity. I spent 40 minutes of neurocritical care time in the care of this patient.  I also discussed with Dr. Kennis CarinaMannam  Shemeka Wardle, MD PhD Stroke Neurology 12/09/2018 4:57 PM   To contact Stroke Continuity provider, please refer to [email or web address]. After hours, contact General Neurology

## 2018-12-09 NOTE — ED Notes (Signed)
Per neurologist request not to do a swallow screen at this time and keep patient npo

## 2018-12-09 NOTE — ED Notes (Signed)
Pt could not with stand the MRI without medication, the neurologist at this time does not want patient to have any sedatives at this time.

## 2018-12-09 NOTE — H&P (Addendum)
Admission H&P    Chief Complaint: Dr. Elesa MassedWard  HPI: Walter Grimes is an 28 y.o. male who presents to the ED after a seizure. Per RN who spoke with his girlfriend: "pt was picking up girlfriend when he texted her that he was not feeling well, gf went outside and saw pt was on the ground stating he was having a heart attack and vomited at that time. Pt then was helped to the car and was put in the car and still talking when he started to seizure per girlfriend that his whole body was twitching and not responding for 3 minutes per girlfriend then pt woke up and ems was called by a coworker that was at the girlfriends job. When ems arrived pt was still twitching and ems told girlfriend that pt was still seizing but for girlfriend stated that pt did not look like he was seizing any more but just twitching and more alert."   EMS noted him to be postictal after the seizure, which continued to be present on arrival to the ED. He was not given Versed by EMS. Left hand tremor was noted by EMS.   The patient at baseline apparently has no weakness and is fully functional. However, he had a possible seizure about two weeks ago; he was seen for this but was not diagnosed with a seizure disorder and not started on an anticonvulsant.    History reviewed. No pertinent past medical history.  History reviewed. No pertinent surgical history.  History reviewed. No pertinent family history. Social History:  reports that he has been smoking cigarettes. He has been smoking about 1.00 pack per day. He has never used smokeless tobacco. He reports current alcohol use. He reports current drug use. Drug: Marijuana.  Allergies: No Known Allergies  Home Medications: (reviewed)  ROS: Complains of left frontal headache radiating to the entire frontal region. Unable to provide additional ROS.   Physical Examination: Blood pressure 121/72, pulse 65, temperature 97.8 F (36.6 C), temperature source Axillary, resp. rate (!) 21,  SpO2 100 %.  HEENT-  [place]/AT  Cardiovascular - RRR Lungs - CTA. Respirations unlabored Abdomen - Nondistended Extremities - No edema  Neurologic Examination: Mental Status: Somnolent. Oriented to self, city, state and year, but not day or month.  Cranial Nerves: II:  No blink to threat on the right. Left pupil 7 mm in dim light, constricting sluggishly to 4 mm with penlight. Right pupil 5 mm in dim light, constricting to 3 mm with penlight.  III,IV, VI: No ptosis. Exotropia noted. Left eye deviates temporally and lags right eye with EOM. Able to gaze to left and right with both eyes.  V,VII: Mild right facial droop. Decreased sensation on the right.  VIII: hearing intact to voice IX,X: Unable to visualize palate XI: Weak on the right XII: Does not protrude tongue to command Motor: RUE 2/5 with flaccid tone, will adduct at shoulder and flex at elbow only [name] 2/5 LUE and LLE 5/5 Sensory: Decreased FT and temp sensation on the right Deep Tendon Reflexes:  Brisk reflexes with low amplitude at 3+ throughout, brisker on the right.  Plantars:  Right: Equivocal   Left: downgoing Cerebellar: No ataxia with left FNF. Unable to perform on the right.  Gait: Unable to assess   Results for orders placed or performed during the hospital encounter of 12/08/18 (from the past 48 hour(s))  CBC - if new onset seizures     Status: Abnormal   Collection Time: 12/08/18  11:37 PM  Result Value Ref Range   WBC 10.8 (H) 4.0 - 10.5 K/uL   RBC 4.69 4.22 - 5.81 MIL/uL   Hemoglobin 14.6 13.0 - 17.0 g/dL   HCT 43.5 39.0 - 52.0 %   MCV 92.8 80.0 - 100.0 fL   MCH 31.1 26.0 - 34.0 pg   MCHC 33.6 30.0 - 36.0 g/dL   RDW 12.8 11.5 - 15.5 %   Platelets 255 150 - 400 K/uL   nRBC 0.0 0.0 - 0.2 %    Comment: Performed at [facility], [place] [address], [place], [place] [address]  CBG monitoring, ED     Status: Abnormal   Collection Time: 12/08/18 11:39 PM  Result Value Ref Range   Glucose-Capillary 117 (H)  70 - 99 mg/dL  Ethanol     Status: None   Collection Time: 12/08/18 11:39 PM  Result Value Ref Range   Alcohol, Ethyl (B) <10 <10 mg/dL    Comment: (NOTE) Lowest detectable limit for serum alcohol is 10 mg/dL. For medical purposes only. Performed at [facility], [place] [address] [place], [place] [address]    Ct Head Wo Contrast  Result Date: 12/09/2018 CLINICAL DATA:  Altered mental status, possible seizure. EXAM: CT HEAD WITHOUT CONTRAST TECHNIQUE: Contiguous axial images were obtained from the base of the skull through the vertex without intravenous contrast. COMPARISON:  05/05/2015 FINDINGS: Brain: There is a left parietal hemorrhage measuring 5.8 x 2.2 cm which extends to the lateral ventricle with intraventricular hemorrhage. Blood noted within the lateral ventricles, 3rd ventricle and 4th ventricle. No hydrocephalus. No midline shift. Vascular: No hyperdense vessel or unexpected calcification. Skull: No acute calvarial abnormality. Sinuses/Orbits: Visualized paranasal sinuses and mastoids clear. Orbital soft tissues unremarkable. Other: None IMPRESSION: 5.8 x 2.2 cm left parietal hemorrhage with intraventricular extension. Critical Value/emergent results were called by telephone at the time of interpretation on 12/09/2018 at 12:22 am to Dr. Mariane Baumgarten, who verbally acknowledged these results. Electronically Signed   By: Rolm Baptise M.D.   On: 12/09/2018 00:27    Assessment: 28 year old male with new onset GTC seizure. CT head reveals acute left parietofrontal ICH of approximately 30 cc volume 1. Seizure may have resulted in acute severe spike in BP, resulting in hypertensive hemorrhage, as he had a seizure like spell two weeks ago. Alternatively, he may have had a small bleed 2 weeks ago precipitating new onset seizure at that time, with extension of bleed today precipitating a second seizure.  2. No significant PMHx listed. Was taking muscle relaxants at home.    Recommendations: 1.  Keppra 2000 mg being loaded IV. Continuing scheduled Keppra at 750 mg IV BID.  2. EEG in the AM 3. Admitting to the ICU under the neurology service.  4. MRI/MRA of head 5. PT consult, OT consult, Speech consult 6. Cardiac telemetry 7. Frequent neuro checks 8. Hypertonic saline 3% at 50 cc/hr 9. BP management with clevidipine drip  10. No antiplatelet medications or anticoagulants. DVT prophylaxis with SCDs 11. Seizure precautions.  60 minutes spent in the emergent neurological evaluation and management of this critically ill patient with acute ICH and seizure  Electronically signed: Dr. Kerney Elbe 12/09/2018, 12:42 AM

## 2018-12-09 NOTE — Progress Notes (Signed)
Pt vomited a lot.  Vomit contained bloody bodily fluids.  No change in neuro status.  Notified Eric Form, NP with CCM and also notified Dr. Erlinda Hong.  Will continue to monitor.

## 2018-12-10 ENCOUNTER — Inpatient Hospital Stay (HOSPITAL_COMMUNITY): Payer: Medicaid Other

## 2018-12-10 DIAGNOSIS — J988 Other specified respiratory disorders: Secondary | ICD-10-CM

## 2018-12-10 DIAGNOSIS — R063 Periodic breathing: Secondary | ICD-10-CM

## 2018-12-10 DIAGNOSIS — I611 Nontraumatic intracerebral hemorrhage in hemisphere, cortical: Principal | ICD-10-CM

## 2018-12-10 LAB — RPR: RPR Ser Ql: NONREACTIVE

## 2018-12-10 LAB — BASIC METABOLIC PANEL
Anion gap: 6 (ref 5–15)
BUN: 9 mg/dL (ref 6–20)
CO2: 20 mmol/L — ABNORMAL LOW (ref 22–32)
Calcium: 9 mg/dL (ref 8.9–10.3)
Chloride: 113 mmol/L — ABNORMAL HIGH (ref 98–111)
Creatinine, Ser: 0.98 mg/dL (ref 0.61–1.24)
GFR calc Af Amer: >60 mL/min (ref >60)
GFR calc non Af Amer: >60 mL/min (ref >60)
Glucose, Bld: 127 mg/dL — ABNORMAL HIGH (ref 70–99)
Potassium: 3.7 mmol/L (ref 3.5–5.1)
Sodium: 139 mmol/L (ref 135–145)

## 2018-12-10 LAB — CBC
HCT: 40.6 % (ref 39.0–52.0)
Hemoglobin: 14.1 g/dL (ref 13.0–17.0)
MCH: 31.2 pg (ref 26.0–34.0)
MCHC: 34.7 g/dL (ref 30.0–36.0)
MCV: 89.8 fL (ref 80.0–100.0)
Platelets: 270 10*3/uL (ref 150–400)
RBC: 4.52 MIL/uL (ref 4.22–5.81)
RDW: 12.8 % (ref 11.5–15.5)
WBC: 17.7 10*3/uL — ABNORMAL HIGH (ref 4.0–10.5)
nRBC: 0 % (ref 0.0–0.2)

## 2018-12-10 LAB — HEMOGLOBIN A1C
Hgb A1c MFr Bld: 5.3 % (ref 4.8–5.6)
Mean Plasma Glucose: 105.41 mg/dL

## 2018-12-10 LAB — LIPID PANEL
Cholesterol: 149 mg/dL (ref 0–200)
HDL: 43 mg/dL (ref >40)
LDL Cholesterol: 93 mg/dL (ref 0–99)
Total CHOL/HDL Ratio: 3.5 RATIO
Triglycerides: 67 mg/dL (ref <150)
VLDL: 13 mg/dL (ref 0–40)

## 2018-12-10 LAB — SODIUM: Sodium: 140 mmol/L (ref 135–145)

## 2018-12-10 LAB — TSH: TSH: 2.072 u[IU]/mL (ref 0.350–4.500)

## 2018-12-10 LAB — VITAMIN B12: Vitamin B-12: 298 pg/mL (ref 180–914)

## 2018-12-10 MED ORDER — ONDANSETRON HCL 4 MG/2ML IJ SOLN
4.0000 mg | Freq: Four times a day (QID) | INTRAMUSCULAR | Status: DC | PRN
  Administered 2018-12-10 – 2018-12-12 (×2): 4 mg via INTRAVENOUS
  Filled 2018-12-10 (×2): qty 2

## 2018-12-10 MED ORDER — BUTALBITAL-APAP-CAFFEINE 50-325-40 MG PO TABS
1.0000 | ORAL_TABLET | Freq: Three times a day (TID) | ORAL | Status: DC | PRN
  Administered 2018-12-10 – 2018-12-19 (×16): 1 via ORAL
  Filled 2018-12-10 (×17): qty 1

## 2018-12-10 MED ORDER — HYDRALAZINE HCL 20 MG/ML IJ SOLN: 10.0000 mg | INTRAMUSCULAR | Status: DC | PRN

## 2018-12-10 MED ORDER — PANTOPRAZOLE SODIUM 40 MG PO TBEC
40.0000 mg | DELAYED_RELEASE_TABLET | Freq: Two times a day (BID) | ORAL | Status: DC
  Administered 2018-12-10 – 2018-12-12 (×4): 40 mg via ORAL
  Filled 2018-12-10 (×4): qty 1

## 2018-12-10 MED ORDER — HYDRALAZINE HCL 20 MG/ML IJ SOLN
10.0000 mg | INTRAMUSCULAR | Status: DC | PRN
  Administered 2018-12-11: 21:00:00 10 mg via INTRAVENOUS
  Filled 2018-12-10: qty 1

## 2018-12-10 NOTE — Progress Notes (Signed)
Pt came down for with contrast images of head.  Pt had three people in the room and we could not keep him on the table, pt while next to the scanner kept thrashing around wildly, would not hold still.  Due to the danger to himself and others we removed him from the room and put him back into the bed. When put back on to his bed, pt became restful again, but when he's near the scanner he fights and tries to get off the table.

## 2018-12-10 NOTE — Progress Notes (Signed)
PT Cancellation Note  Patient Details Name: ELIEZER KHAWAJA MRN: [id number] DOB: Mar 06, 1991   Cancelled Treatment:    Reason Eval/Treat Not Completed: Patient not medically ready (pending MRI and EEG).  Ellamae Sia, PT, DPT Acute Rehabilitation Services Pager [phone] Office [phone]    Willy Eddy 12/10/2018, 1:56 PM

## 2018-12-10 NOTE — Progress Notes (Signed)
SLP Cancellation Note  Patient Details Name: VEDANT SHEHADEH MRN: [id number] DOB: Nov 29, 1990   Cancelled treatment:       Reason Eval/Treat Not Completed: Patient at procedure or test/unavailable(Pt currently with nurse tech. SLP will re-attempt eval later)  Arlie Riker I. [name], [place], [place] Office number [phone] Pager [place] 12/10/2018, 9:21 AM

## 2018-12-10 NOTE — Progress Notes (Signed)
SLP Cancellation Note  Patient Details Name: Walter Grimes MRN: [id number] DOB: 1990-12-03   Cancelled treatment:       Reason Eval/Treat Not Completed: Other (comment)(Pt unable to participate in evaluation due to inconsistent alertness and agitation. SLP will follow up on subsequent date unless his status improves significantly today.)  Ellenie Salome I. [name], [place], [place] Office number [phone] Pager [place] 12/10/2018, 10:53 AM

## 2018-12-10 NOTE — Progress Notes (Signed)
Having recurrent spells all preceded by agitation. Agitation phase lasts 2-3 minutes, followed by complete LOC without posturing or jerking, then after an interval of about 5-10 minutes, RN will try to arouse him and he will "snap back", or if no stimulation, will awaken spontaneously after about 10 minutes. Vitals are stable during the spells, which are stereotyped in nature.   A/R: ICH with new onset seizure. Now with recurrent stereotyped spells 1. Obtaining LTM 2. Continue Keppra 750 mg BID  Electronically signed: Dr. Kerney Elbe

## 2018-12-10 NOTE — Progress Notes (Signed)
LTM ordered; pt to get MRI 1st per nurse. Will call us when patient is available.

## 2018-12-10 NOTE — Progress Notes (Addendum)
NAME:  Walter Grimes, MRN:  [id number], DOB:  December 22, 1990, LOS: 1 ADMISSION DATE:  6Estil Daft/15/2020, CONSULTATION DATE:  12/09/2018 REFERRING MD:  Roda ShuttersXu, CHIEF COMPLAINT:  Apnea   Brief History   28 yr old male smoker presented with seizure.  Found to have acute Lt frontoparietal ICH with IV extension.  UDS positive for THC.  Developed [place] breathing pattern and PCCM consulted.  No prior medical history.  Significant Hospital Events   6/16 Admit  Consults:    Procedures:    Significant Diagnostic Tests:  CT head 6/16 >> 5.8 Lt parietal hemorrhage with IV extension Echo 6/16 >> EF 60 to 65% EEG 6/16 >> normal awake/drowsy states  Micro Data:  COVID 6/16 >> negative   Antimicrobials:    Interim history/subjective:  Intermittently communicates.  Somnolent at present.  Sitter at bedside.  Objective   Blood pressure 128/75, pulse (!) 55, temperature 99.1 F (37.3 C), temperature source Oral, resp. rate (!) 23, height 5\' 8"  (1.727 m), weight 76.6 kg, SpO2 100 %.        Intake/Output Summary (Last 24 hours) at 12/10/2018 [id number] Last data filed at 12/10/2018 0800 Gross per 24 hour  Intake 1405.5 ml  Output 1175 ml  Net 230.5 ml   Filed Weights   12/09/18 0342  Weight: 76.6 kg    Examination:  General - somnolent Eyes - pupils reactive ENT - snoring, no stridor Cardiac - regular, bradycardic Chest - equal breath sounds b/l, no wheezing or rales Abdomen - soft, non tender, + bowel sounds Extremities - no cyanosis, clubbing, or edema Skin - no rashes Neuro - not following commands  CXR (reviewed by me) - no ASD   Resolved Hospital Problem list     Assessment & Plan:   Shelda Jakes[place]yne [place] respiratory pattern in setting of ICH. Plan - monitor need for ETT - oxygen as needed to keep SpO2 90 to 95% - defer Bipap with fluctuating mental status  New onset seizure in setting of Lt parietal ICH with IVH. Cerebral edema. Plan - f/u repeat MRI - 3% saline - continue  AEDs  HTN. Plan - goal SBP < 140 - continue cleviprex  Hematemesis. Plan - continue protonix bid   Best practice:  Diet: NPO DVT prophylaxis: SCDs GI prophylaxis: Protonix Mobility: bed rest Code Status: Full Disposition: ICU  Labs    CMP Latest Ref Rng & Units 12/10/2018 12/09/2018 12/09/2018  Glucose 70 - 99 mg/dL 119(J127(H) - -  BUN 6 - 20 mg/dL 9 - -  Creatinine 4.780.61 - 1.24 mg/dL 2.950.98 - -  Sodium 621135 - 145 mmol/L 139 138 138  Potassium 3.5 - 5.1 mmol/L 3.7 - -  Chloride 98 - 111 mmol/L 113(H) - -  CO2 22 - 32 mmol/L 20(L) - -  Calcium 8.9 - 10.3 mg/dL 9.0 - -  Total Protein 6.5 - 8.1 g/dL - - -  Total Bilirubin 0.3 - 1.2 mg/dL - - -  Alkaline Phos 38 - 126 U/L - - -  AST 15 - 41 U/L - - -  ALT 0 - 44 U/L - - -   CBC Latest Ref Rng & Units 12/10/2018 12/09/2018 12/08/2018  WBC 4.0 - 10.5 K/uL 17.7(H) - 10.8(H)  Hemoglobin 13.0 - 17.0 g/dL 30.814.1 65.715.6 84.614.6  Hematocrit 39.0 - 52.0 % 40.6 46.0 43.5  Platelets 150 - 400 K/uL 270 - 255   ABG    Component Value Date/Time   PHART 7.423 12/09/2018 1108  PCO2ART 34.1 12/09/2018 1108   PO2ART 88.0 12/09/2018 1108   HCO3 22.2 12/09/2018 1108   TCO2 23 12/09/2018 1108   ACIDBASEDEF 1.0 12/09/2018 1108   O2SAT 97.0 12/09/2018 1108   CBG (last 3)  Recent Labs    12/08/18 2339  GLUCAP 117*    CC time 32 minutes  Chesley Mires, MD [place] 12/10/2018, 9:50 AM

## 2018-12-10 NOTE — Progress Notes (Signed)
STROKE TEAM PROGRESS NOTE   INTERVAL HISTORY Pt drowsy sleepy but arousable, able to answer some orientation questions. Has safety sitter at bedside. Pt still has intermittent sleep apnea but improving. Due to mental status, EEG was ordered but still pending. MRI brain pending at 3pm. No more vomiting.   Vitals:   12/10/18 0800 12/10/18 0815 12/10/18 0831 12/10/18 0900  BP: (!) 162/79 (!) 144/80 128/75 140/63  Pulse: (!) 52 (!) 56 (!) 55   Resp: 14 19 (!) 23 (!) 26  Temp: 99.1 F (37.3 C)     TempSrc: Oral     SpO2: 100% 100% 100% 100%  Weight:      Height:        CBC:  Recent Labs  Lab 12/08/18 2337 12/09/18 1108 12/10/18 0145  WBC 10.8*  --  17.7*  HGB 14.6 15.6 14.1  HCT 43.5 46.0 40.6  MCV 92.8  --  89.8  PLT 255  --  270    Basic Metabolic Panel:  Recent Labs  Lab 12/08/18 2337  12/09/18 1108  12/09/18 1917 12/10/18 0145  NA 141   < > 141   < > 138 139  K 3.2*  --  3.6  --   --  3.7  CL 106  --   --   --   --  113*  CO2 25  --   --   --   --  20*  GLUCOSE 144*  --   --   --   --  127*  BUN 10  --   --   --   --  9  CREATININE 1.22  --   --   --   --  0.98  CALCIUM 9.3  --   --   --   --  9.0   < > = values in this interval not displayed.   Lipid Panel:     Component Value Date/Time   CHOL 149 12/10/2018 0145   TRIG 67 12/10/2018 0145   HDL 43 12/10/2018 0145   CHOLHDL 3.5 12/10/2018 0145   VLDL 13 12/10/2018 0145   LDLCALC 93 12/10/2018 0145   HgbA1c:  Lab Results  Component Value Date   HGBA1C 5.3 12/10/2018   Urine Drug Screen:     Component Value Date/Time   LABOPIA NONE DETECTED 12/09/2018 0456   COCAINSCRNUR NONE DETECTED 12/09/2018 0456   LABBENZ NONE DETECTED 12/09/2018 0456   AMPHETMU NONE DETECTED 12/09/2018 0456   THCU POSITIVE (A) 12/09/2018 0456   LABBARB NONE DETECTED 12/09/2018 0456    Alcohol Level     Component Value Date/Time   ETH <10 12/08/2018 2339    IMAGING Ct Head Wo Contrast  Result Date:  12/09/2018 CLINICAL DATA:  Intracranial hemorrhage. Change in mental status. Patient has become less responsive. EXAM: CT HEAD WITHOUT CONTRAST TECHNIQUE: Contiguous axial images were obtained from the base of the skull through the vertex without intravenous contrast. COMPARISON:  MRI brain 12/09/2018 at 3:15 a.m. FINDINGS: Brain: Previously noted left parietal parenchymal hemorrhage demonstrates slight increase in size when measured in AP diameter on the sagittal reformatted images. Maximal dimension is now 5.8 cm, compared with 5.4 cm on the prior study. Midline shift is stable at 4 mm. Hemorrhage within the corpus callosum and ventricles is stable. There is no hydrocephalus. No new areas of hemorrhage are present. Vascular: No hyperdense vessel or unexpected calcification. Skull: Calvarium is intact. No focal lytic or blastic lesions are present. No significant  extracranial soft tissue injury is present. Sinuses/Orbits: A polyp or mucous retention cyst is again noted posteriorly in the left maxillary sinus. The paranasal sinuses and mastoid air cells are otherwise clear. Globes demonstrate dysconjugate gaze. This may be within normal limits. Globes and orbits are otherwise within normal limits. Other: IMPRESSION: 1. Slight increase in left parietal parenchymal hemorrhage from 5.4 to 5.8 cm in AP diameter as measured on the sagittal reformatted images. 2. Hemorrhage is otherwise stable including involvement of the corpus callosum and extension into the ventricles. 3. No hydrocephalus. 4. Stable mass effect with 4 mm midline shift. 5. No new hemorrhage. The above was relayed via text pager to Dr. Caryl PinaERIC LINDZEN on 12/09/2018 at 04:47 . Electronically Signed   By: Marin Robertshristopher  Mattern M.D.   On: 12/09/2018 04:47   Ct Head Wo Contrast  Result Date: 12/09/2018 CLINICAL DATA:  Altered mental status, possible seizure. EXAM: CT HEAD WITHOUT CONTRAST TECHNIQUE: Contiguous axial images were obtained from the base of the  skull through the vertex without intravenous contrast. COMPARISON:  05/05/2015 FINDINGS: Brain: There is a left parietal hemorrhage measuring 5.8 x 2.2 cm which extends to the lateral ventricle with intraventricular hemorrhage. Blood noted within the lateral ventricles, 3rd ventricle and 4th ventricle. No hydrocephalus. No midline shift. Vascular: No hyperdense vessel or unexpected calcification. Skull: No acute calvarial abnormality. Sinuses/Orbits: Visualized paranasal sinuses and mastoids clear. Orbital soft tissues unremarkable. Other: None IMPRESSION: 5.8 x 2.2 cm left parietal hemorrhage with intraventricular extension. Critical Value/emergent results were called by telephone at the time of interpretation on 12/09/2018 at 12:22 am to Dr. Senaida OresPaulina, who verbally acknowledged these results. Electronically Signed   By: Charlett NoseKevin  Dover M.D.   On: 12/09/2018 00:27   Mr Shirlee LatchMra Head RUWo Contrast  Result Date: 12/09/2018 CLINICAL DATA:  28 y/o  M; intracranial hemorrhage for follow-up. EXAM: MRI HEAD WITHOUT CONTRAST MRA HEAD WITHOUT CONTRAST TECHNIQUE: MRI of the brain with axial DWI, sagittal T1, axial T2 blade, axial FLAIR blade sequences were acquired. MRA time-of-flight was acquired in the axial plane with multiplanar MIP reconstruction. COMPARISON:  12/09/2018 CT head FINDINGS: MRI HEAD FINDINGS Brain: Stable T1 isointense and T2 hyperintense acute hemorrhage centered within the left medial parietal lobe extending into corpus callosum measuring 58 x 21 mm (AP by ML series 15, image 20). Stable volume of intraventricular hemorrhage throughout the ventricular system, greatest in left lateral ventricle. Associated mass effect with 4 mm of left-to-right midline shift. Mildly increased T2 FLAIR signal within the quadrigeminal plate cistern and diffuse sulci predominantly over left posterior convexity his compatible subarachnoid hemorrhage, likely due to redistribution. No downward herniation at this time. Vascular: Normal  flow voids. No vascular nidus or clustered flow voids identified. Skull and upper cervical spine: Normal marrow signal. Sinuses/Orbits: Left maxillary sinus mucous retention cyst. Additional included paranasal sinuses and mastoid air cells demonstrate normal signal. Orbits are normal. Other: None. MRA HEAD FINDINGS Internal carotid arteries:  Patent. Anterior cerebral arteries:  Patent. Middle cerebral arteries: Patent. Anterior communicating artery: Not identified, likely hypoplastic or absent. Posterior communicating arteries:  Patent. Posterior cerebral arteries:  Patent. Basilar artery:  Patent. Vertebral arteries:  Patent. No evidence of high-grade stenosis, large vessel occlusion, or aneurysm. No high flow vascular lesion is identified within the field of view, however, the superior aspect of the hematoma is above the field of view. IMPRESSION: 1. Stable acute hemorrhage within left medial parietal lobe extending into corpus callosum. Stable intraventricular hemorrhage greatest in left lateral ventricle. Trace  volume of subarachnoid hemorrhage likely due to redistribution. Stable associated mass effect with 4 mm left-to-right midline shift. No clustered flow voids to indicate vascular nidus identified. 2. Otherwise unremarkable MRI of the brain. 3. Normal MRA of the head. No high flow vascular lesion within the field of view, however, the superior aspect of the hematoma is above the field of view. Electronically Signed   By: Mitzi HansenLance  Furusawa-Stratton M.D.   On: 12/09/2018 03:57   Mr Brain Wo Contrast  Result Date: 12/09/2018 CLINICAL DATA:  28 y/o  M; intracranial hemorrhage for follow-up. EXAM: MRI HEAD WITHOUT CONTRAST MRA HEAD WITHOUT CONTRAST TECHNIQUE: MRI of the brain with axial DWI, sagittal T1, axial T2 blade, axial FLAIR blade sequences were acquired. MRA time-of-flight was acquired in the axial plane with multiplanar MIP reconstruction. COMPARISON:  12/09/2018 CT head FINDINGS: MRI HEAD FINDINGS  Brain: Stable T1 isointense and T2 hyperintense acute hemorrhage centered within the left medial parietal lobe extending into corpus callosum measuring 58 x 21 mm (AP by ML series 15, image 20). Stable volume of intraventricular hemorrhage throughout the ventricular system, greatest in left lateral ventricle. Associated mass effect with 4 mm of left-to-right midline shift. Mildly increased T2 FLAIR signal within the quadrigeminal plate cistern and diffuse sulci predominantly over left posterior convexity his compatible subarachnoid hemorrhage, likely due to redistribution. No downward herniation at this time. Vascular: Normal flow voids. No vascular nidus or clustered flow voids identified. Skull and upper cervical spine: Normal marrow signal. Sinuses/Orbits: Left maxillary sinus mucous retention cyst. Additional included paranasal sinuses and mastoid air cells demonstrate normal signal. Orbits are normal. Other: None. MRA HEAD FINDINGS Internal carotid arteries:  Patent. Anterior cerebral arteries:  Patent. Middle cerebral arteries: Patent. Anterior communicating artery: Not identified, likely hypoplastic or absent. Posterior communicating arteries:  Patent. Posterior cerebral arteries:  Patent. Basilar artery:  Patent. Vertebral arteries:  Patent. No evidence of high-grade stenosis, large vessel occlusion, or aneurysm. No high flow vascular lesion is identified within the field of view, however, the superior aspect of the hematoma is above the field of view. IMPRESSION: 1. Stable acute hemorrhage within left medial parietal lobe extending into corpus callosum. Stable intraventricular hemorrhage greatest in left lateral ventricle. Trace volume of subarachnoid hemorrhage likely due to redistribution. Stable associated mass effect with 4 mm left-to-right midline shift. No clustered flow voids to indicate vascular nidus identified. 2. Otherwise unremarkable MRI of the brain. 3. Normal MRA of the head. No high flow  vascular lesion within the field of view, however, the superior aspect of the hematoma is above the field of view. Electronically Signed   By: Mitzi HansenLance  Furusawa-Stratton M.D.   On: 12/09/2018 03:57   Dg Chest Port 1 View  Result Date: 12/09/2018 CLINICAL DATA:  28 year old male with a history of respiratory failure EXAM: PORTABLE CHEST 1 VIEW COMPARISON:  07/21/2018 FINDINGS: The heart size and mediastinal contours are within normal limits. Both lungs are clear. The visualized skeletal structures are unremarkable. IMPRESSION: Negative for acute cardiopulmonary disease. Electronically Signed   By: Gilmer MorJaime  Wagner D.O.   On: 12/09/2018 13:21    PHYSICAL EXAM Temp:  [98 F (36.7 C)-99.4 F (37.4 C)] 99.1 F (37.3 C) (06/17 0800) Pulse Rate:  [51-86] 55 (06/17 0831) Resp:  [0-30] 26 (06/17 0900) BP: (104-162)/(48-80) 140/63 (06/17 0900) SpO2:  [93 %-100 %] 100 % (06/17 0900)  General - Well nourished, well developed, intermitttent sleepy apnea, ? central.  Ophthalmologic - fundi not visualized due to noncooperation.  Cardiovascular -  Regular rate and rhythm.  Neuro - drowsy sleepy but arousable, needed repetitive stimulation for weakfulness, orientated to place, age, but not to time and not cooperative on questions. Language not cooperative on testing, mild dysarthria. Left facial droop, PERRL, eye midline, able to track bilaterally. Left hemiplegia. RUE and [name] spontaneous movement. RUE against gravity. Left UE and LE hypotonia with positive babinski. Sensation, coordination and gait not tested.    ASSESSMENT/PLAN [name] is a 28 y.o. male with no significant past medical history presenting post seizure with L parietal frontal ICH seen on CT.   New onset Seizure - ?? secondary to [place] ??  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  Stable L IVH.  Trace SAH.  Stable 4 mm L to R shift.  MRA Unremarkable   Repeat CT head slight increase L IPH from 5.4 to 5.8 cm in AP diameter.  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 Stress ulcer ??  Bloody vomitus on 12/09/18, now resolved  Concerning for stress ulcer  On protonix IV bid -> PO bid  CCM on board  Monitoring CBC  Leukocytosis  WBC 10.8->17.7  No fever  UA neg  CXR neg  CBC monitoring  Tobacco abuse  Current smoker  Smoking cessation counseling provided  Pt is willing to quit  Other Stroke Risk Factors  ETOH use, advised to drink no more than 2 drink(s) a day  Marijuana use w/ UDS positive for [facility] - education provided  Hypokalemia 3.2->3.7   Hospital day # 1  This patient is critically ill due to [place], IVH, new onset seizure, stress ulcer, cerebral edema and central sleep apnea and at significant risk of neurological worsening, death form respiratory failure, hematoma expansion, obstructive hydrocephalus, cerebral edema, brain herniation, status epilepticus and GI bleeding. This patient's care requires constant monitoring of vital signs, hemodynamics, respiratory and cardiac monitoring, review of multiple databases, neurological assessment, discussion with family, other specialists and medical decision  making of high complexity. I spent 40 minutes of neurocritical care time in the care of this patient.  I also discussed with Dr. Joneen Caraway, MD PhD Stroke Neurology 12/10/2018 9:51 AM   To contact Stroke Continuity provider, please refer to [email or web address] After hours, contact General Neurology

## 2018-12-10 NOTE — Progress Notes (Signed)
vLTM EEG running/ notified neuro/ educated the sitter of event button

## 2018-12-10 NOTE — Progress Notes (Signed)
Pt was taken to MRI with SWOT RN and Air cabin crew. Pt was given 2mg  Ativan prior to scan. He was not able to sit still during the MRI. Dr. Erlinda Hong paged. Canceling MRI and going back down for CT head. EEG will be notified once we return to connect pt. Ishana Blades, Rande Brunt, RN

## 2018-12-10 NOTE — Progress Notes (Signed)
OT Cancellation Note  Patient Details Name: Walter Grimes MRN: [id number] DOB: 06-17-1991   Cancelled Treatment:    Reason Eval/Treat Not Completed: Medical issues which prohibited therapy;Patient at procedure or test/ unavailable(Pt alternating between decreased arousal and agitation. Planning for MRI and EEG today. Hold OT evaluation until medically ready. Will return as schedule allows. Thank you.)  Sylva, OTR/L Acute Rehab Pager: [phone] Office: [phone] 12/10/2018, 1:56 PM

## 2018-12-10 NOTE — Progress Notes (Signed)
Pt pulled off half of his EEG leads. He is noncompliant and irritable. Sitter is at bedside. MD paged and aware. EEG tech called and message left with them for callback. Heyden Jaber, Rande Brunt, RN

## 2018-12-11 DIAGNOSIS — G936 Cerebral edema: Secondary | ICD-10-CM

## 2018-12-11 LAB — BASIC METABOLIC PANEL
Anion gap: 10 (ref 5–15)
BUN: 8 mg/dL (ref 6–20)
CO2: 23 mmol/L (ref 22–32)
Calcium: 9.4 mg/dL (ref 8.9–10.3)
Chloride: 104 mmol/L (ref 98–111)
Creatinine, Ser: 1 mg/dL (ref 0.61–1.24)
GFR calc Af Amer: >60 mL/min (ref >60)
GFR calc non Af Amer: >60 mL/min (ref >60)
Glucose, Bld: 119 mg/dL — ABNORMAL HIGH (ref 70–99)
Potassium: 3.5 mmol/L (ref 3.5–5.1)
Sodium: 137 mmol/L (ref 135–145)

## 2018-12-11 LAB — CBC
HCT: 43.7 % (ref 39.0–52.0)
Hemoglobin: 15.5 g/dL (ref 13.0–17.0)
MCH: 31.3 pg (ref 26.0–34.0)
MCHC: 35.5 g/dL (ref 30.0–36.0)
MCV: 88.1 fL (ref 80.0–100.0)
Platelets: 259 10*3/uL (ref 150–400)
RBC: 4.96 MIL/uL (ref 4.22–5.81)
RDW: 12.1 % (ref 11.5–15.5)
WBC: 15.7 10*3/uL — ABNORMAL HIGH (ref 4.0–10.5)
nRBC: 0 % (ref 0.0–0.2)

## 2018-12-11 MED ORDER — WHITE PETROLATUM EX OINT
TOPICAL_OINTMENT | CUTANEOUS | Status: AC
  Administered 2018-12-11: 0.2
  Filled 2018-12-11: qty 28.35

## 2018-12-11 MED ORDER — CHLORHEXIDINE GLUCONATE 0.12 % MT SOLN
15.0000 mL | Freq: Two times a day (BID) | OROMUCOSAL | Status: DC
  Administered 2018-12-12 – 2018-12-13 (×4): 15 mL via OROMUCOSAL
  Filled 2018-12-11 (×5): qty 15

## 2018-12-11 MED ORDER — ORAL CARE MOUTH RINSE
15.0000 mL | Freq: Two times a day (BID) | OROMUCOSAL | Status: DC
  Administered 2018-12-12 (×2): 15 mL via OROMUCOSAL

## 2018-12-11 NOTE — Progress Notes (Signed)
STROKE TEAM PROGRESS NOTE   INTERVAL HISTORY Pt drowsy sleepy but arousable, able to answer orientation questions. Has safety sitter at bedside. Pt still has central sleep apnea but refused BiPAP. Not cooperative with EEG leads but EEG so far negative for seizrue. CT head repeat stable hematoma.  Vitals:   12/11/18 0700 12/11/18 0800 12/11/18 0900 12/11/18 1000  BP: (!) 148/74 140/69 132/80 136/82  Pulse: (!) 56 (!) 56 (!) 50 (!) 55  Resp: 19 11 17 16   Temp:  98.4 F (36.9 C)    TempSrc:  Oral    SpO2: 100% 98% 100% 100%  Weight:      Height:        CBC:  Recent Labs  Lab 12/10/18 0145 12/11/18 0544  WBC 17.7* 15.7*  HGB 14.1 15.5  HCT 40.6 43.7  MCV 89.8 88.1  PLT 270 381    Basic Metabolic Panel:  Recent Labs  Lab 12/10/18 0145 12/10/18 0757 12/11/18 0544  NA 139 140 137  K 3.7  --  3.5  CL 113*  --  104  CO2 20*  --  23  GLUCOSE 127*  --  119*  BUN 9  --  8  CREATININE 0.98  --  1.00  CALCIUM 9.0  --  9.4   Lipid Panel:     Component Value Date/Time   CHOL 149 12/10/2018 0145   TRIG 67 12/10/2018 0145   HDL 43 12/10/2018 0145   CHOLHDL 3.5 12/10/2018 0145   VLDL 13 12/10/2018 0145   LDLCALC 93 12/10/2018 0145   HgbA1c:  Lab Results  Component Value Date   HGBA1C 5.3 12/10/2018   Urine Drug Screen:     Component Value Date/Time   LABOPIA NONE DETECTED 12/09/2018 0456   COCAINSCRNUR NONE DETECTED 12/09/2018 0456   LABBENZ NONE DETECTED 12/09/2018 0456   AMPHETMU NONE DETECTED 12/09/2018 0456   THCU POSITIVE (A) 12/09/2018 0456   LABBARB NONE DETECTED 12/09/2018 0456    Alcohol Level     Component Value Date/Time   ETH <10 12/08/2018 2339    IMAGING Ct Head Wo Contrast  Result Date: 12/11/2018 CLINICAL DATA:  Intracranial hemorrhage, follow-up EXAM: CT HEAD WITHOUT CONTRAST TECHNIQUE: Contiguous axial images were obtained from the base of the skull through the vertex without intravenous contrast. COMPARISON:  12/09/2018 FINDINGS: Brain:  Left parietal hemorrhage is again noted. Surrounding vasogenic edema. No real change in the size of the bleed with evolutionary change. Extension into the lateral ventricles. Decreasing blood in the left lateral ventricle posterior horn. No hydrocephalus. Mild increase in left-to-right midline shift, now 6 mm compared to 4 mm previously. No new hemorrhage. Vascular: No hyperdense vessel or unexpected calcification. Skull: No acute calvarial abnormality. Sinuses/Orbits: No acute finding Other: None IMPRESSION: Stable size of the left parietal intracerebral hemorrhage with early evolutionary changes. Intraventricular hemorrhage appears slightly decreased with decreasing blood in the posterior horn of the left lateral ventricle. Slight increased left-to-right midline shift, now 6 mm. Electronically Signed   By: Rolm Baptise M.D.   On: 12/11/2018 02:54   Dg Chest Port 1 View  Result Date: 12/09/2018 CLINICAL DATA:  28 year old male with a history of respiratory failure EXAM: PORTABLE CHEST 1 VIEW COMPARISON:  07/21/2018 FINDINGS: The heart size and mediastinal contours are within normal limits. Both lungs are clear. The visualized skeletal structures are unremarkable. IMPRESSION: Negative for acute cardiopulmonary disease. Electronically Signed   By: Corrie Mckusick D.O.   On: 12/09/2018 13:21  PHYSICAL EXAM Temp:  [98.4 F (36.9 C)-100.1 F (37.8 C)] 98.4 F (36.9 C) (06/18 0800) Pulse Rate:  [47-92] 55 (06/18 1000) Resp:  [7-25] 16 (06/18 1000) BP: (132-159)/(57-82) 136/82 (06/18 1000) SpO2:  [94 %-100 %] 100 % (06/18 1000)  General - Well nourished, well developed, intermitttent sleepy apnea, ? central.  Ophthalmologic - fundi not visualized due to noncooperation.  Cardiovascular - Regular rate and rhythm.  Neuro - drowsy sleepy but arousable, needed repetitive stimulation for weakfulness, orientated to place, age, year but not to month. Language not cooperative on testing, but able to form  short sentences, mild to moderate dysarthria. Still has sleep apnea pattern. Right facial droop, PERRL, eye midline, able to track bilaterally. Right hemiplegia. LUE and LLE spontaneous movement. Right UE and LE hypotonia with positive babinski. Sensation, coordination not cooperative and gait not tested.   ASSESSMENT/PLAN [name] is a 28 y.o. male with no significant past medical history presenting post seizure with L parietal frontal ICH seen on CT.   New onset Seizure - likely secondary to ICH  ?? Possible seizure 2 weeks ago  keppra 2000 mg load followed by 750 bid  EEG normal  Recurrent "spells" preceded by agitation, 5-10 min w/o jerking  LTM EEG dramatic motion artifact but no seizure  ICH:  L parietal frontal ICH w/ IVH - etiology unclear  CT head 5.8 x 2.2 cm L parietal ICH with IVH  MRI stable L medial parietal ICH extending into the corpus callosum.  Stable L IVH.  Trace SAH.  Stable 4 mm L to R shift.  MRA Unremarkable   Repeat CT head slight increase L IPH from 5.4 to 5.8 cm in AP diameter.  Repeat CT 6/17 stable hematoma and slight decrease of IVH but slight increase of midline shift  CT repeat in am  Consider MRI with contrast once cooperative to evaluate source of bleeding  2D Echo EF 60-65%  LDL 93  HgbA1c 5.3  HIV neg, RPR neg  SCDs for VTE prophylaxis  No antithrombotic prior to admission, now on No antithrombotic given hemorrhage  Therapy recommendations:  pending   Disposition:  pending   Cerebral Edema  off 3% now given small size of ICH  Na 139->141->138->139->140->137  Repeat CT 6/17 stable hematoma and slight decrease of IVH but slight increase of midline shift  CT repeat in am  Central sleep APNEA  Felt to be secondary to IVH  ABG normal  Did not tolerate bipap  CCM on board  ?? Stress ulcer  Bloody vomitus on 12/09/18, now resolved  Concerning for stress ulcer  On protonix IV bid -> PO bid  CCM on  board  Monitoring CBC  Leukocytosis  WBC 10.8->17.7->15.7  No fever  UA neg  CXR neg  CBC monitoring  Tobacco abuse  Current smoker  Smoking cessation counseling provided  Pt is willing to quit  Other Stroke Risk Factors  ETOH use, advised to drink no more than 2 drink(s) a day  [facility] education provided  Hypokalemia 3.2->3.7->3.5   Hospital day # 2  This patient is critically ill due to ICH, IVH, new onset seizure, stress ulcer, cerebral edema and central sleep apnea and at significant risk of neurological worsening, death form respiratory failure, hematoma expansion, obstructive hydrocephalus, cerebral edema, brain herniation, status epilepticus and GI bleeding. This patient's care requires constant monitoring of vital signs, hemodynamics, respiratory and cardiac monitoring, review of multiple databases,  neurological assessment, discussion with family, other specialists and medical decision making of high complexity. I spent 35 minutes of neurocritical care time in the care of this patient.   Marvel PlanJindong Dewana Ammirati, MD PhD Stroke Neurology 12/11/2018 11:25 AM   To contact Stroke Continuity provider, please refer to [email or web address]. After hours, contact General Neurology

## 2018-12-11 NOTE — Progress Notes (Signed)
LTM EEG taken down, pt removed all electrodes himself.

## 2018-12-11 NOTE — Progress Notes (Signed)
OT Cancellation Note  Patient Details Name: Walter Grimes MRN: [id number] DOB: 05/30/1991   Cancelled Treatment:    Reason Eval/Treat Not Completed: Active bedrest order; will follow.  Lou Cal, OT Supplemental Rehabilitation Services Pager [phone] Office [phone]   Raymondo Band 12/11/2018, 9:39 AM

## 2018-12-11 NOTE — Progress Notes (Signed)
Rehab Admissions Coordinator Note:  Patient was screened by Cleatrice Burke for appropriateness for an Inpatient Acute Rehab Consult per PT recs. Noted limited due to bradycardic episodes, waxing and waning of attention and restlessness. Will await further progress with therapies before pursing an inpt rehab consult. I will follow.   Cleatrice Burke RN MSN 12/11/2018, 1:39 PM  I can be reached at [phone].

## 2018-12-11 NOTE — Evaluation (Signed)
Occupational Therapy Evaluation Patient Details Name: Walter Grimes J Filip MRN: [id number] DOB: Feb 25, 1991 Today's Date: 12/11/2018    History of Present Illness Pt is a 28 y.o. M who presents after a seizure. CT 6/16 showing left parietal hemorrhage with intraventricular extension. Repeat CT 6/18 showing slight increase in left parietal parenchymal hemorrhage. Normal EEG.    Clinical Impression   This 28 y/o male presents with the above. 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 Pt with waxing/waning cognition this session and with periods of bradycardia during moments of decreased arousal. Pt very anxious when aroused and difficult to redirect. He presents with significant R side weakness including decreased sensation/proprioception. Pt currently requires totalA for ADL completion and to reposition in bed. He will benefit from continued acute OT services, and once arousal levels improve suspect pt will progress well. Recommend follow up CIR level therapy services at time of discharge to maximize his safety and independence with ADL and mobility. Will follow.     Follow Up Recommendations  CIR;Supervision/Assistance - 24 hour    Equipment Recommendations  Other (comment)(TBD in next venue)           Precautions / Restrictions Precautions Precautions: Fall;Other (comment) Precaution Comments: waxing/waning alertness Restrictions Weight Bearing Restrictions: No      Mobility Bed Mobility Overal bed mobility: Needs Assistance             General bed mobility comments: Multiple instances of progressing to long sitting without physical assist. Unsafe to sit EOB this session. Repositioned hips with totalA                         ADL either performed or assessed with clinical judgement   ADL Overall ADL's : Needs assistance/impaired                                       General ADL Comments: pt  currently requires totalA for ADL at this time, most due to impaired cognition and pt with waxing/waning levels of alertness, will continue to assess                         Pertinent Vitals/Pain Pain Assessment: Faces Faces Pain Scale: Hurts even more Pain Location: head Pain Descriptors / Indicators: Aching;Headache Pain Intervention(s): Premedicated before session     Hand Dominance     Extremity/Trunk Assessment Upper Extremity Assessment Upper Extremity Assessment: RUE deficits/detail RUE Deficits / Details: no active movement noted, pt reports he can't feel RUE when therapist moving through ROM; PROM WFL RUE Sensation: decreased light touch;decreased proprioception RUE Coordination: decreased fine motor;decreased gross motor   Lower Extremity Assessment Lower Extremity Assessment: Defer to PT evaluation [name] Deficits / Details: No active movement noted, PROM WFL. Tendency for right foot to rest in increased dorsiflexion/inversion       Communication Communication Communication: No difficulties   Cognition Arousal/Alertness: (Varying levels of alertness) Behavior During Therapy: Anxious;Agitated;Restless Overall Cognitive Status: Impaired/Different from baseline Area of Impairment: Attention;Memory;Following commands;Awareness                   Current Attention Level: Focused Memory: Decreased short-term memory Following Commands: Follows one step commands inconsistently((limited by level of alertness))   Awareness: Intellectual   General Comments: Pt with waxing/waning alertness, intermittently falling asleep every ~60  seconds with associated bradycardia. Once awake, pt with agitation, pulling at mittens, increased RR and stating, "I just want to die." Not very receptive to attempts to calm/redirect. Aware of deficits i.e. 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"        OT Problem List: Decreased strength;Decreased range of motion;Decreased activity tolerance;Impaired balance (sitting and/or standing);Decreased coordination;Decreased cognition;Decreased safety awareness;Impaired UE functional use;Pain;Decreased knowledge of use of DME or AE;Decreased knowledge of precautions      OT Treatment/Interventions: Self-care/ADL training;Therapeutic exercise;DME and/or AE instruction;Therapeutic activities;Patient/family education;Balance training;Visual/perceptual remediation/compensation;Cognitive remediation/compensation;Energy conservation;Neuromuscular education    OT Goals(Current goals can be found in the care plan section) Acute Rehab OT Goals Patient Stated Goal: unable OT Goal Formulation: Patient unable to participate in goal setting Time For Goal Achievement: 12/25/18 Potential to Achieve Goals: Good  OT Frequency: Min 2X/week(may benefit from 3x once agitation decreases)   Barriers to D/C:            Co-evaluation PT/OT/SLP Co-Evaluation/Treatment: Yes Reason for Co-Treatment: Complexity of the patient's impairments (multi-system involvement);For patient/therapist safety;To address functional/ADL transfers PT goals addressed during session: Mobility/safety with mobility OT goals addressed during session: ADL's and self-care;Strengthening/ROM      AM-PAC OT "6 Clicks" Daily Activity     Outcome Measure Help from another person eating meals?: A Lot Help from another person taking care of personal grooming?: Total Help from another person toileting, which includes using  toliet, bedpan, or urinal?: Total Help from another person bathing (including washing, rinsing, drying)?: Total Help from another person to put on and taking off regular upper body clothing?: Total Help from another person to put on and taking off regular lower body clothing?: Total 6 Click Score: 7   End of Session Nurse Communication: Mobility status  Activity Tolerance: Patient tolerated treatment well;Patient limited by lethargy;Treatment limited secondary to medical complications (Comment)(waxing/waning cognition) Patient left: with call bell/phone within reach;in bed;with bed alarm set;with nursing/sitter in room;with restraints reapplied(sitter present)  OT Visit Diagnosis: Muscle weakness (generalized) (M62.81);Other symptoms and signs involving cognitive function;Other symptoms and signs involving the nervous system (R29.898);Hemiplegia and hemiparesis Hemiplegia - Right/Left: Right Hemiplegia - caused by: Other Nontraumatic intracranial hemorrhage                Time: 1207-1226 OT Time Calculation (min): 19 min Charges:  OT General Charges $OT Visit: 1 Visit OT Evaluation $OT Eval Moderate Complexity: 1 Mod  Lou Cal, OT [employer] Pager [phone] Office [place] 12/11/2018, 1:51 PM

## 2018-12-11 NOTE — Evaluation (Signed)
Speech Language Pathology Evaluation Patient Details Name: Walter Grimes MRN: [id number] DOB: September 17, 1990 Today's Date: 12/11/2018 Time: [id number] SLP Time Calculation (min) (ACUTE ONLY): 27 min  Problem List:  Patient Active Problem List   Diagnosis Date Noted  . ICH (intracerebral hemorrhage) (HCC) 12/09/2018   Past Medical History: History reviewed. No pertinent past medical history. Past Surgical History: History reviewed. No pertinent surgical history. HPI:  Pt is a 28 y.o. male who presents to the ED after a seizure. EMS noted him to be postictal after the seizure, which continued to be present on arrival to the ED. He was not given Versed by EMS. Left hand tremor was noted by EMS. CT of the head of 12/09/18 revealed 5.8 x 2.2 cm left parietal hemorrhage with intraventricular extension. Repeat CT of the head showed slight increase in left parietal parenchymal hemorrhage from 5.4 to 5.8 cm in AP diameter. EEG was normal. CT of the head of 12/11/18: Lt parietal hemorrhage with vasogenic edema, extension into lateral ventricles, no hydrocephalus, 6 mm Lt to Rt shift.   Assessment / Plan / Recommendation Clinical Impression  Pt continues to alternate between periods of lethargy and agitation when alert. Pt's evaluation was therefore completed in 1-2-minute intervals with frequent stimulation to maintain alertness when his alertness waned and/or participate during periods of agitation. He reported that he was living independently with his girlfriend prior to admission and was employed full-time as a Comptrollerrecording engineer. He denied any baseline deficits in speech, language or cognition. His expressive language and motor speech skills were within functional limits. Receptively he was able to answer simple yes/no questions but was inadequately cooperative for further assessment of receptive language. Concerning cognition, he demonstrated difficulty with attention, memory, complex problem solving, and  orientation to time and situation. However, the impact of his varying alertness on his cognitive performance is considered. Continued skilled SLP services are clinically indicated at this time to further assess receptive language and for cognitive-linguistic treatment.     SLP Assessment  SLP Recommendation/Assessment: Patient needs continued Speech Lanaguage Pathology Services SLP Visit Diagnosis: Cognitive communication deficit (R41.841)    Follow Up Recommendations  Other (comment)(Continued SLP services at level of care recommended by PT/OT)    Frequency and Duration min 2x/week  2 weeks      SLP Evaluation Cognition  Overall Cognitive Status: Impaired/Different from baseline Arousal/Alertness: Awake/alert Orientation Level: Oriented to person;Oriented to place;Disoriented to situation;Disoriented to time Attention: Focused;Sustained Focused Attention: Impaired Focused Attention Impairment: Verbal basic Sustained Attention: Impaired Sustained Attention Impairment: Verbal basic Memory: Impaired Memory Impairment: Storage deficit;Decreased recall of new information Awareness: Impaired Awareness Impairment: Intellectual impairment Problem Solving: Impaired Problem Solving Impairment: Verbal complex       Comprehension  Auditory Comprehension Overall Auditory Comprehension: Other (comment) Yes/No Questions: Within Functional Limits Commands: Impaired Reading Comprehension Reading Status: Not tested    Expression Expression Primary Mode of Expression: Verbal Verbal Expression Overall Verbal Expression: Appears within functional limits for tasks assessed Initiation: No impairment Level of Generative/Spontaneous Verbalization: Sentence;Conversation   Oral / Motor  Oral Motor/Sensory Function Overall Oral Motor/Sensory Function: Within functional limits Motor Speech Overall Motor Speech: Appears within functional limits for tasks assessed Respiration: Within functional  limits Phonation: Normal Resonance: Within functional limits Articulation: Within functional limitis Intelligibility: Intelligible Motor Planning: Witnin functional limits Motor Speech Errors: Not applicable   Walter Grimes. Vear ClockPhillips, MS, CCC-SLP Acute Rehabilitation Services Office number [phone] Pager [phone]  Horton Marshall 12/11/2018, 12:23 PM

## 2018-12-11 NOTE — Procedures (Signed)
Continuous Video-EEG Monitoring Report  Study Duration:  12/10/2018 16:05 to 12/11/2018 09:00 CPT Code:   71062 Diagnosis and Code: Seizures (R56.9)  History: This is a 28 year old male presenting with recurrent seizure-like spells in the setting of left parietal intraparenchymal hemorrhage.  Continuous video-EEG monitoring was performed to evaluate for seizures.  Technical Details:  Long-term video-EEG monitoring was performed using standard setting per the guidelines.  Briefly, a minimum of 21 electrodes were placed on scalp according to the International 10-20 or/and 10-10 Systems.  Supplemental electrodes were placed as needed.  Single EKG electrode was also used to detect cardiac arrhythmia.  Patient's behavior was continuously recorded on video simultaneously with EEG.  A minimum of 16 channels were used for data display.  Each epoch of study was reviewed manually daily and as needed using standard referential and bipolar montages.    EEG Description:  This study was degraded by excessive electrode artifacts.  During wakefulness, there was bilateral reactive 10-11 Hz posterior dominant rhythm.  During drowsiness, slow roving eye movement, vertex waves and attenuation of background and were seen.  No clear stage II sleep was present.  No epileptiform discharges or seizures were in evidence.    Impression:  This study is degraded by excessive electrode artifacts.  The discernible portion of the study is normal.  No spell was captured.                  Reading Physician: Winfield Cunas, MD, PhD

## 2018-12-11 NOTE — Progress Notes (Signed)
NAME:  Walter Grimes, MRN:  [id number], DOB:  03/05/1991, LOS: 2 ADMISSION DATE:  12/08/2018, CONSULTATION DATE:  12/09/2018 REFERRING MD:  Roda ShuttersXu, CHIEF COMPLAINT:  Apnea   Brief History   28 yr old male smoker presented with seizure.  Found to have acute Lt frontoparietal ICH with IV extension.  UDS positive for THC.  Developed [place] breathing pattern and PCCM consulted.  No prior medical history.  Significant Hospital Events   6/16 Admit  Consults:    Procedures:    Significant Diagnostic Tests:  CT head 6/16 >> 5.8 Lt parietal hemorrhage with IV extension Echo 6/16 >> EF 60 to 65% EEG 6/16 >> normal awake/drowsy states CT head 6/18 >> Lt parietal hemorrhage with vasogenic edema, extension into lateral ventricles, no hydrocephalus, 6 mm Lt to Rt shift  Micro Data:  COVID 6/16 >> negative   Antimicrobials:    Interim history/subjective:  Sitter at bedside.  Intermittently agitated.  Pulled off EEG leads earlier this morning.  Objective   Blood pressure 140/69, pulse (!) 56, temperature 98.4 F (36.9 C), temperature source Oral, resp. rate 11, height 5\' 8"  (1.727 m), weight 76.6 kg, SpO2 98 %.        Intake/Output Summary (Last 24 hours) at 12/11/2018 0905 Last data filed at 12/11/2018 [id number] Gross per 24 hour  Intake 326 ml  Output 490 ml  Net -164 ml   Filed Weights   12/09/18 0342  Weight: 76.6 kg    Examination:  General - somnolent Eyes - pupils reactive ENT - no sinus tenderness, no stridor Cardiac - regular rate/rhythm, no murmur Chest - equal breath sounds b/l, no wheezing or rales Abdomen - soft, non tender, + bowel sounds Extremities - no cyanosis, clubbing, or edema Skin - no rashes Neuro - not following commands   Resolved Hospital Problem list     Assessment & Plan:   [place]lda J[place]yne [place] respiratory pattern in setting of ICH. 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 Cerebral edema. Plan - continue AEDs - MRI arranged per neurology  HTN. Plan - cleviprex with goal SBP < 140  Hematemesis 12/09/18 >> no further episodes. Plan - continue protonix BID   Best practice:  Diet: clear liquid diet DVT prophylaxis: SCDs GI prophylaxis: Protonix Mobility: bed rest Code Status: Full Disposition: ICU  Labs    CMP Latest Ref Rng & Units 12/11/2018 12/10/2018 12/10/2018  Glucose 70 - 99 mg/dL 119(J119(H) - 478(G127(H)  BUN 6 - 20 mg/dL 8 - 9  Creatinine 9.560.61 - 1.24 mg/dL 2.131.00 - 0.860.98  Sodium 578135 - 145 mmol/L 137 140 139  Potassium 3.5 - 5.1 mmol/L 3.5 - 3.7  Chloride 98 - 111 mmol/L 104 - 113(H)  CO2 22 - 32 mmol/L 23 - 20(L)  Calcium 8.9 - 10.3 mg/dL 9.4 - 9.0  Total Protein 6.5 - 8.1 g/dL - - -  Total Bilirubin 0.3 - 1.2 mg/dL - - -  Alkaline Phos 38 - 126 U/L - - -  AST 15 - 41 U/L - - -  ALT 0 - 44 U/L - - -   CBC Latest Ref Rng & Units 12/11/2018 12/10/2018 12/09/2018  WBC 4.0 - 10.5 K/uL 15.7(H) 17.7(H) -  Hemoglobin 13.0 - 17.0 g/dL 46.915.5 62.914.1 52.815.6  Hematocrit 39.0 - 52.0 % 43.7  40.6 46.0  Platelets 150 - 400 K/uL 259 270 -   ABG    Component Value Date/Time   PHART 7.423 12/09/2018 1108   PCO2ART 34.1 12/09/2018 1108   PO2ART 88.0 12/09/2018 1108   HCO3 22.2 12/09/2018 1108   TCO2 23 12/09/2018 1108   ACIDBASEDEF 1.0 12/09/2018 1108   O2SAT 97.0 12/09/2018 1108   CBG (last 3)  Recent Labs    12/08/18 [place], MD [place] 12/11/2018, 9:05 AM

## 2018-12-11 NOTE — Evaluation (Addendum)
Physical Therapy Evaluation Patient Details Name: Walter Grimes MRN: [id number] DOB: 1990-10-01 Today's Date: 12/11/2018   History of Present Illness  Pt is a 28 y.o. M who presents after a seizure. CT 6/16 showing left parietal hemorrhage with intraventricular extension. Repeat CT 6/18 showing slight increase in left parietal parenchymal hemorrhage. Normal EEG.   Clinical Impression  Pt admitted with above. Pt with interesting presentation, with intermittent periods of waxing/waning attention, unresponsive every ~60 seconds with associated bradycardic episodes (both RN and MD aware). When awake, pt with increased respiration rate and agitated/anxious (although not combative on this visit). Pt presents with right sided hemiparesis and decreased sensation. Displays good left sided strength. Able to progress to long sitting without physical assist, although unsafe at this time to progress to edge of bed due to episodes listed above. Will follow acutely to progress as tolerated. Considering age and PLOF, recommending CIR to maximize functional independence.     Follow Up Recommendations CIR;Supervision/Assistance - 24 hour    Equipment Recommendations  Other (comment)(tbd)    Recommendations for Other Services       Precautions / Restrictions Precautions Precautions: Fall;Other (comment) Precaution Comments: waxing/waning alertness Restrictions Weight Bearing Restrictions: No      Mobility  Bed Mobility Overal bed mobility: Needs Assistance             General bed mobility comments: Multiple instances of progressing to long sitting without physical assist. Unsafe to sit EOB this session. Repositioned hips with totalA  Transfers                    Ambulation/Gait                Stairs            Wheelchair Mobility    Modified Rankin (Stroke Patients Only) Modified Rankin (Stroke Patients Only) Pre-Morbid Rankin Score: No symptoms Modified Rankin:  Severe disability     Balance                                             Pertinent Vitals/Pain Pain Assessment: Faces Faces Pain Scale: Hurts even more Pain Location: head Pain Descriptors / Indicators: Aching Pain Intervention(s): RN gave pain meds during session    Home Living Family/patient expects to be discharged to:: Private residence Living Arrangements: Spouse/significant other(girlfriend) Available Help at Discharge: Friend(s)                  Prior Function Level of Independence: Independent         Comments: Works as Office manager"sound engineer"     Hand Dominance        Extremity/Trunk Assessment   Upper Extremity Assessment Upper Extremity Assessment: Defer to OT evaluation    Lower Extremity Assessment Lower Extremity Assessment: [name] deficits/detail [name] Deficits / Details: No active movement noted, PROM WFL. Tendency for right foot to rest in increased dorsiflexion/inversion       Communication   Communication: No difficulties  Cognition Arousal/Alertness: (Varying levels of alertness) Behavior During Therapy: Anxious;Agitated;Restless Overall Cognitive Status: Impaired/Different from baseline Area of Impairment: Attention;Memory;Following commands;Awareness                   Current Attention Level: Focused Memory: Decreased short-term memory Following Commands: Follows one step commands inconsistently((limited by level of alertness))   Awareness: Intellectual   General  Comments: Pt with waxing/waning alertness, intermittently falling asleep every ~60 seconds with associated bradycardia. Once awake, pt with agitation, pulling at mittens, increased RR and stating, "I just want to die." Not very receptive to attempts to calm/redirect. Aware of deficits i.e. not being able to move right arm but does not understand why       General Comments      Exercises     Assessment/Plan    PT Assessment Patient needs continued  PT services  PT Problem List Decreased strength;Decreased range of motion;Decreased balance;Decreased mobility;Decreased cognition;Decreased safety awareness       PT Treatment Interventions Gait training;Functional mobility training;Therapeutic activities;Therapeutic exercise;Balance training;Neuromuscular re-education;Cognitive remediation;Patient/family education    PT Goals (Current goals can be found in the Care Plan section)  Acute Rehab PT Goals Patient Stated Goal: unable PT Goal Formulation: Patient unable to participate in goal setting Time For Goal Achievement: 12/25/18 Potential to Achieve Goals: Good    Frequency Min 4X/week   Barriers to discharge        Co-evaluation PT/OT/SLP Co-Evaluation/Treatment: Yes Reason for Co-Treatment: Complexity of the patient's impairments (multi-system involvement);For patient/therapist safety;Necessary to address cognition/behavior during functional activity;To address functional/ADL transfers PT goals addressed during session: Mobility/safety with mobility         AM-PAC PT "6 Clicks" Mobility  Outcome Measure Help needed turning from your back to your side while in a flat bed without using bedrails?: A Little Help needed moving from lying on your back to sitting on the side of a flat bed without using bedrails?: A Lot Help needed moving to and from a bed to a chair (including a wheelchair)?: Total Help needed standing up from a chair using your arms (e.g., wheelchair or bedside chair)?: Total Help needed to walk in hospital room?: Total Help needed climbing 3-5 steps with a railing? : Total 6 Click Score: 9    End of Session   Activity Tolerance: Treatment limited secondary to agitation Patient left: in bed;with call bell/phone within reach;with nursing/sitter in room Nurse Communication: Other (comment)(bradycardic epidoes) PT Visit Diagnosis: Other abnormalities of gait and mobility (R26.89);Hemiplegia and hemiparesis;Other  symptoms and signs involving the nervous system (R29.898) Hemiplegia - Right/Left: Right Hemiplegia - dominant/non-dominant: Dominant Hemiplegia - caused by: Nontraumatic intracerebral hemorrhage    Time: 1206-1226 PT Time Calculation (min) (ACUTE ONLY): 20 min   Charges:   PT Evaluation $PT Eval Moderate Complexity: [place], PT, DPT Acute Rehabilitation Services Pager [phone] Office [phone]   Willy Eddy 12/11/2018, 1:20 PM

## 2018-12-12 ENCOUNTER — Inpatient Hospital Stay (HOSPITAL_COMMUNITY): Payer: Medicaid Other

## 2018-12-12 LAB — CBC
HCT: 44.9 % (ref 39.0–52.0)
Hemoglobin: 16 g/dL (ref 13.0–17.0)
MCH: 31 pg (ref 26.0–34.0)
MCHC: 35.6 g/dL (ref 30.0–36.0)
MCV: 87 fL (ref 80.0–100.0)
Platelets: 261 10*3/uL (ref 150–400)
RBC: 5.16 MIL/uL (ref 4.22–5.81)
RDW: 11.8 % (ref 11.5–15.5)
WBC: 14.2 10*3/uL — ABNORMAL HIGH (ref 4.0–10.5)
nRBC: 0 % (ref 0.0–0.2)

## 2018-12-12 LAB — BASIC METABOLIC PANEL
Anion gap: 12 (ref 5–15)
BUN: 7 mg/dL (ref 6–20)
CO2: 24 mmol/L (ref 22–32)
Calcium: 9.5 mg/dL (ref 8.9–10.3)
Chloride: 102 mmol/L (ref 98–111)
Creatinine, Ser: 0.89 mg/dL (ref 0.61–1.24)
GFR calc Af Amer: >60 mL/min (ref >60)
GFR calc non Af Amer: >60 mL/min (ref >60)
Glucose, Bld: 100 mg/dL — ABNORMAL HIGH (ref 70–99)
Potassium: 3.1 mmol/L — ABNORMAL LOW (ref 3.5–5.1)
Sodium: 138 mmol/L (ref 135–145)

## 2018-12-12 MED ORDER — LEVETIRACETAM IN NACL 1000 MG/100ML IV SOLN
1000.0000 mg | Freq: Two times a day (BID) | INTRAVENOUS | Status: DC
  Administered 2018-12-12 – 2018-12-13 (×3): 1000 mg via INTRAVENOUS
  Filled 2018-12-12 (×3): qty 100

## 2018-12-12 MED ORDER — NALOXONE HCL 0.4 MG/ML IJ SOLN
INTRAMUSCULAR | Status: AC
  Administered 2018-12-12: 0.2 mg via INTRAVENOUS
  Filled 2018-12-12: qty 1

## 2018-12-12 MED ORDER — LEVETIRACETAM IN NACL 1500 MG/100ML IV SOLN
1500.0000 mg | Freq: Once | INTRAVENOUS | Status: AC
  Administered 2018-12-12: 06:00:00 1500 mg via INTRAVENOUS
  Filled 2018-12-12: qty 100

## 2018-12-12 MED ORDER — FENTANYL CITRATE (PF) 100 MCG/2ML IJ SOLN
12.5000 ug | Freq: Once | INTRAMUSCULAR | Status: AC
  Administered 2018-12-12: 23:00:00 12.5 ug via INTRAVENOUS
  Filled 2018-12-12: qty 2

## 2018-12-12 MED ORDER — PANTOPRAZOLE SODIUM 40 MG PO TBEC
40.0000 mg | DELAYED_RELEASE_TABLET | Freq: Every day | ORAL | Status: DC
  Administered 2018-12-13 – 2018-12-19 (×7): 40 mg via ORAL
  Filled 2018-12-12 (×7): qty 1

## 2018-12-12 MED ORDER — POTASSIUM CHLORIDE CRYS ER 20 MEQ PO TBCR
30.0000 meq | EXTENDED_RELEASE_TABLET | ORAL | Status: AC
  Administered 2018-12-12 (×2): 30 meq via ORAL
  Filled 2018-12-12 (×2): qty 1

## 2018-12-12 MED ORDER — SODIUM CHLORIDE 0.9 % IV SOLN
INTRAVENOUS | Status: DC | PRN
  Administered 2018-12-12: 500 mL via INTRAVENOUS
  Administered 2018-12-15: 23:00:00 250 mL via INTRAVENOUS

## 2018-12-12 MED ORDER — HEPARIN SODIUM (PORCINE) 5000 UNIT/ML IJ SOLN
5000.0000 [IU] | Freq: Three times a day (TID) | INTRAMUSCULAR | Status: DC
  Administered 2018-12-12 – 2018-12-19 (×21): 5000 [IU] via SUBCUTANEOUS
  Filled 2018-12-12 (×21): qty 1

## 2018-12-12 MED ORDER — NALOXONE HCL 0.4 MG/ML IJ SOLN
0.2000 mg | INTRAMUSCULAR | Status: DC | PRN
  Administered 2018-12-12: 0.2 mg via INTRAVENOUS

## 2018-12-12 MED ORDER — POTASSIUM CHLORIDE CRYS ER 20 MEQ PO TBCR
40.0000 meq | EXTENDED_RELEASE_TABLET | ORAL | Status: AC
  Administered 2018-12-12 (×2): 40 meq via ORAL
  Filled 2018-12-12 (×2): qty 2

## 2018-12-12 NOTE — Progress Notes (Signed)
eLink Physician-Brief Progress Note Patient Name: Walter Grimes DOB: March 28, 1991 MRN: [id number]   Date of Service  12/12/2018  HPI/Events of Note  Patient having periods of apnea associated with HR going down into 40's and sats going down into 70's. Likely has sleep apnea. Current situation made worst by small dose of Fentanyl 12.5 mcg.   eICU Interventions  Will need formal sleep study at some point in time. Would be extremely cautious with narcotic administration.      Intervention Category Major Interventions: Hypoxemia - evaluation and management  Walter Grimes 12/12/2018, 11:56 PM

## 2018-12-12 NOTE — Progress Notes (Signed)
At 0550, patient had an assisted fall to the floor via the sitter. Twanna Cobb, NT, stated that the patient had suddenly pushed himself mostly over the left side rail, she was unable to push him back in bed before he pushed further out. She assisted his fall to the bedside floor mat and called for help. Delfin Gant, RN was the first RN in the room, who next notified the unit. Upon arrival, I found the patient on the floor with his head turned to the right, fixed upper right gaze and not responsive to any stimuli. Vital signs unchanged and airway maintained. Next, I notified Dr. Cheral Marker.  Patient was returned to bed via lift sheet. After a couple of minutes, the patient became responsive. Patient denied pain and sitter confirmed that he did not hit his head.

## 2018-12-12 NOTE — Progress Notes (Signed)
STROKE TEAM PROGRESS NOTE   INTERVAL HISTORY Pt drowsy sleepy but arousable, able to answer orientation questions. Has safety sitter at bedside. Pt had CT this am showed stable hematoma and no hydro. However, after coming back from CT, pt had episode of right gaze and not responding with fall at bedside. The fall was controlled by sitter at that time so not hit head. Loaded with keppra.   Vitals:   12/12/18 0558 12/12/18 0600 12/12/18 0700 12/12/18 0800  BP: (!) 148/75 139/73 131/76 (!) 149/82  Pulse: (!) 58 (!) 51 (!) 51 (!) 54  Resp: (!) 6 13 18 19   Temp:    98.6 F (37 C)  TempSrc:    Oral  SpO2: 96% 97% 96% 96%  Weight:      Height:        CBC:  Recent Labs  Lab 12/11/18 0544 12/12/18 0424  WBC 15.7* 14.2*  HGB 15.5 16.0  HCT 43.7 44.9  MCV 88.1 87.0  PLT 259 261    Basic Metabolic Panel:  Recent Labs  Lab 12/11/18 0544 12/12/18 0424  NA 137 138  K 3.5 3.1*  CL 104 102  CO2 23 24  GLUCOSE 119* 100*  BUN 8 7  CREATININE 1.00 0.89  CALCIUM 9.4 9.5   Lipid Panel:     Component Value Date/Time   CHOL 149 12/10/2018 0145   TRIG 67 12/10/2018 0145   HDL 43 12/10/2018 0145   CHOLHDL 3.5 12/10/2018 0145   VLDL 13 12/10/2018 0145   LDLCALC 93 12/10/2018 0145   HgbA1c:  Lab Results  Component Value Date   HGBA1C 5.3 12/10/2018   Urine Drug Screen:     Component Value Date/Time   LABOPIA NONE DETECTED 12/09/2018 0456   COCAINSCRNUR NONE DETECTED 12/09/2018 0456   LABBENZ NONE DETECTED 12/09/2018 0456   AMPHETMU NONE DETECTED 12/09/2018 0456   THCU POSITIVE (A) 12/09/2018 0456   LABBARB NONE DETECTED 12/09/2018 0456    Alcohol Level     Component Value Date/Time   ETH <10 12/08/2018 2339    IMAGING Ct Head Wo Contrast  Result Date: 12/12/2018 CLINICAL DATA:  Intracranial hemorrhage, known, follow-up EXAM: CT HEAD WITHOUT CONTRAST TECHNIQUE: Contiguous axial images were obtained from the base of the skull through the vertex without intravenous  contrast. COMPARISON:  CT head without contrast 12/10/2018 FINDINGS: Brain: Left posterior frontal and parietal hemorrhage is again noted. There is some contraction of the clot with surrounding edema, as expected. Hemorrhage extends across the posterior corpus callosum. No new hemorrhage is present. Intraventricular hemorrhage is evident bilaterally. There is no hydrocephalus. No new hemorrhage is present. No significant extra-axial fluid collection is present. No new hemorrhage is present. No acute infarct is present. The ventricles are of normal size. Vascular: No hyperdense vessel or unexpected calcification. Skull: Calvarium is intact. No focal lytic or blastic lesions are present. Sinuses/Orbits: A polyp or mucous retention cyst in the left maxillary sinus is stable. The paranasal sinuses and mastoid air cells are otherwise clear. IMPRESSION: 1. Continued evolution of posterior left frontal and parietal hematoma without significant expansion. There may be some contraction of the clot with developing edema. 2. Stable extension of hemorrhage into the corpus callosum. 3. Intraventricular hemorrhage without significant hydrocephalus. Electronically Signed   By: Marin Robertshristopher  Mattern M.D.   On: 12/12/2018 07:31   Ct Head Wo Contrast  Result Date: 12/11/2018 CLINICAL DATA:  Intracranial hemorrhage, follow-up EXAM: CT HEAD WITHOUT CONTRAST TECHNIQUE: Contiguous axial images were obtained  from the base of the skull through the vertex without intravenous contrast. COMPARISON:  12/09/2018 FINDINGS: Brain: Left parietal hemorrhage is again noted. Surrounding vasogenic edema. No real change in the size of the bleed with evolutionary change. Extension into the lateral ventricles. Decreasing blood in the left lateral ventricle posterior horn. No hydrocephalus. Mild increase in left-to-right midline shift, now 6 mm compared to 4 mm previously. No new hemorrhage. Vascular: No hyperdense vessel or unexpected calcification.  Skull: No acute calvarial abnormality. Sinuses/Orbits: No acute finding Other: None IMPRESSION: Stable size of the left parietal intracerebral hemorrhage with early evolutionary changes. Intraventricular hemorrhage appears slightly decreased with decreasing blood in the posterior horn of the left lateral ventricle. Slight increased left-to-right midline shift, now 6 mm. Electronically Signed   By: Rolm Baptise M.D.   On: 12/11/2018 02:54    PHYSICAL EXAM Temp:  [98.3 F (36.8 C)-99.4 F (37.4 C)] 98.6 F (37 C) (06/19 0800) Pulse Rate:  [48-101] 54 (06/19 0800) Resp:  [3-27] 19 (06/19 0800) BP: (130-173)/(67-118) 149/82 (06/19 0800) SpO2:  [95 %-100 %] 96 % (06/19 0800)  General - Well nourished, well developed, intermitttent sleepy apnea, ? central.  Ophthalmologic - fundi not visualized due to noncooperation.  Cardiovascular - Regular rate and rhythm.  Neuro - drowsy sleepy but arousable, needed repetitive stimulation for weakfulness, orientated to place, age, name but not to time or situation. Language not cooperative on testing, but able to form short sentences, mild dysarthria. Still has intermittent sleep apnea pattern, but improving. Right facial droop, PERRL, eye midline, able to track bilaterally. Right hemiplegia. LUE and LLE spontaneous movement. Right UE and LE hypotonia with positive babinski. Sensation, coordination not cooperative and gait not tested.   ASSESSMENT/PLAN Mr. FOCH ROSENWALD is a 28 y.o. male with no significant past medical history presenting post seizure with L parietal frontal ICH seen on CT.   New onset Seizure - likely secondary to [place]  ?? Possible seizure 2 weeks ago  keppra 2000 mg load followed by 750 bid on admission  EEG normal  Recurrent "spells" preceded by agitation, 5-10 min w/o jerking  LTM EEG 12/10/18 dramatic motion artifact but no seizure  12/12/18 am episode of right gaze and unresponsiveness  Loaded again with keppra and keppra  increase to 1000mg  bid  Put on LTM EEG again today  ICH:  L parietal frontal ICH w/ IVH - etiology unclear  CT head 5.8 x 2.2 cm L parietal ICH with IVH  MRI stable L medial parietal ICH extending into the corpus callosum.  Stable L IVH.  Trace SAH.  Stable 4 mm L to R shift.  MRA Unremarkable   Repeat CT head slight increase L IPH from 5.4 to 5.8 cm in AP diameter.  Repeat CT 6/17 stable hematoma and slight decrease of IVH but slight increase of midline shift  CT repeat 12/12/18 stable hematoma and no hydro  Consider MRI with contrast once cooperative to evaluate source of bleeding  2D Echo EF 60-65%  LDL 93  HgbA1c 5.3  HIV neg, RPR neg  Heparin subq for VTE prophylaxis  No antithrombotic prior to admission, now on No antithrombotic given hemorrhage  Therapy recommendations:  pending   Disposition:  pending   Cerebral Edema  off 3% given small size of ICH  Na 139->141->138->139->140->137->138  Repeat CT 6/17 stable hematoma and slight decrease of IVH but slight increase of midline shift  CT repeat 12/12/18 stable hematoma and no hydro  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 Stress ulcer  Bloody vomitus on 12/09/18, now resolved  Concerning for stress ulcer  On protonix IV bid -> PO bid  CCM on board  Monitoring CBC  Leukocytosis  WBC 10.8->17.7->15.7->14.2  No fever  UA neg  CXR neg  CBC monitoring  Tobacco abuse  Current smoker  Smoking cessation counseling provided  Pt is willing to quit  Other Stroke Risk Factors  ETOH use, advised to drink no more than 2 drink(s) a day  [facility] education provided  Hypokalemia 3.2->3.7->3.5->3.1 - supplement and check Mg in am   Hospital day # 3  This patient is critically ill due to ICH, IVH, new onset seizure, stress ulcer, cerebral edema and central sleep apnea and at  significant risk of neurological worsening, death form respiratory failure, hematoma expansion, obstructive hydrocephalus, cerebral edema, brain herniation, status epilepticus and GI bleeding. This patient's care requires constant monitoring of vital signs, hemodynamics, respiratory and cardiac monitoring, review of multiple databases, neurological assessment, discussion with family, other specialists and medical decision making of high complexity. I spent 40 minutes of neurocritical care time in the care of this patient. I also discussed with Dr. Craige CottaSood.   Marvel PlanJindong Chelsey Kimberley, MD PhD Stroke Neurology 12/12/2018 10:51 AM   To contact Stroke Continuity provider, please refer to [email or web address]. After hours, contact General Neurology

## 2018-12-12 NOTE — Progress Notes (Signed)
LTM started - Neuro notified - no initial skin breakdown

## 2018-12-12 NOTE — Progress Notes (Signed)
NAME:  Walter Grimes, MRN:  [id number], DOB:  1991/02/23, LOS: 3 ADMISSION DATE:  12/08/2018, CONSULTATION DATE:  12/09/2018 REFERRING MD:  Erlinda Hong, CHIEF COMPLAINT:  Apnea   Brief History   28 yr old male smoker presented with seizure.  Found to have acute Lt frontoparietal ICH with IV extension.  UDS positive for THC.  [place] breathing pattern and PCCM consulted.  No prior medical history.  Significant Hospital Events   6/16 Admit 6/18 seizure, fell out of bed  Consults:    Procedures:    Significant Diagnostic Tests:  CT head 6/16 >> 5.8 Lt parietal hemorrhage with IV extension Echo 6/16 >> EF 60 to 65% EEG 6/16 >> normal awake/drowsy states CT head 6/18 >> Lt parietal hemorrhage with vasogenic edema, extension into lateral ventricles, no hydrocephalus, 6 mm Lt to Rt shift  Micro Data:  COVID 6/16 >> negative   Antimicrobials:    Interim history/subjective:  C/o headache.  Objective   Blood pressure (!) 149/82, pulse (!) 54, temperature 98.6 F (37 C), temperature source Oral, resp. rate 19, height 5\' 8"  (1.727 m), weight 76.6 kg, SpO2 96 %.        Intake/Output Summary (Last 24 hours) at 12/12/2018 1002 Last data filed at 12/12/2018 0800 Gross per 24 hour  Intake 1563.62 ml  Output 3450 ml  Net -1886.38 ml   Filed Weights   12/09/18 0342  Weight: 76.6 kg    Examination:  General - somnolent, wakes up easily Eyes - pupils reactive ENT - no sinus tenderness, no stridor Cardiac - regular rate/rhythm, no murmur Chest - equal breath sounds b/l, no wheezing or rales Abdomen - soft, non tender, + bowel sounds Extremities - no cyanosis, clubbing, or edema Skin - no rashes Neuro - follows commands, moves extremities   Resolved Hospital Problem list     Assessment & Plan:   Phylis Bougie respiratory pattern in setting of ICH. Plan - didn't tolerate Bipap - should improve as stroke symptoms improve  New onset seizure in setting of Lt  parietal ICH with IVH. Cerebral edema. Plan - AEDs per neurology  HTN. Plan - goal SBP < 140  Hematemesis 12/09/18 >> no further episodes. Plan - change to protonix daily   Best practice:  Diet: clear liquid diet DVT prophylaxis: SCDs GI prophylaxis: Protonix Mobility: bed rest Code Status: Full Disposition: ICU  PCCM will sign off.  Please call if additional help needed.  Labs    CMP Latest Ref Rng & Units 12/12/2018 12/11/2018 12/10/2018  Glucose 70 - 99 mg/dL 100(H) 119(H) -  BUN 6 - 20 mg/dL 7 8 -  Creatinine 0.61 - 1.24 mg/dL 0.89 1.00 -  Sodium 135 - 145 mmol/L 138 137 140  Potassium 3.5 - 5.1 mmol/L 3.1(L) 3.5 -  Chloride 98 - 111 mmol/L 102 104 -  CO2 22 - 32 mmol/L 24 23 -  Calcium 8.9 - 10.3 mg/dL 9.5 9.4 -  Total Protein 6.5 - 8.1 g/dL - - -  Total Bilirubin 0.3 - 1.2 mg/dL - - -  Alkaline Phos 38 - 126 U/L - - -  AST 15 - 41 U/L - - -  ALT 0 - 44 U/L - - -   CBC Latest Ref Rng & Units 12/12/2018 12/11/2018 12/10/2018  WBC 4.0 - 10.5 K/uL 14.2(H) 15.7(H) 17.7(H)  Hemoglobin 13.0 - 17.0 g/dL 16.0 15.5 14.1  Hematocrit 39.0 - 52.0 % 44.9 43.7 40.6  Platelets 150 - 400 K/uL  261 259 270   ABG    Component Value Date/Time   PHART 7.423 12/09/2018 1108   PCO2ART 34.1 12/09/2018 1108   PO2ART 88.0 12/09/2018 1108   HCO3 22.2 12/09/2018 1108   TCO2 23 12/09/2018 1108   ACIDBASEDEF 1.0 12/09/2018 1108   O2SAT 97.0 12/09/2018 1108   CBG (last 3)  No results for input(s): GLUCAP in the last 72 hours.   Coralyn HellingVineet Meya Clutter, MD [facility] 12/12/2018, 10:02 AM

## 2018-12-12 NOTE — Progress Notes (Signed)
After follow up CT this morning, patient had a sudden alteration in level of consciousness with rightward gaze deviation and loss of motor tone resulting in controlled fall to floor with nursing helping to control the fall. No jerking reported. No injuries and did not strike head. Was confused after but now with improving LOC and able to answer an orientation question.   A/R: 28 year old male with left parietal hemorrhage with intraventricular extension.  1. Preliminary review of CT head obtained at 5:35 AM shows no significant interval change.  2. Probable recurrent seizure manifesting with rightward gaze deviation and atonia, followed by postictal state. Rightward gaze deviation suggests a seizure focus in the left hemisphere secondary to irritation by the left parietal bleed. Supplemental load of Keppra 1500 mg IV x 1 has been ordered. Increasing scheduled dose to 1000 mg BID.    Electronically signed: Dr. Kerney Elbe

## 2018-12-12 NOTE — Progress Notes (Signed)
[facility] ADULT ICU REPLACEMENT PROTOCOL FOR AM LAB REPLACEMENT ONLY  The patient does apply for the [facility] Adult ICU Electrolyte Replacment Protocol based on the criteria listed below:   1. Is GFR >/= 40 ml/min? Yes.    Patient's GFR today is >60 2. Is urine output >/= 0.5 ml/kg/hr for the last 6 hours? Yes.   Patient's UOP is 2.35 ml/kg/hr 3. Is BUN < 60 mg/dL? Yes.    Patient's BUN today is 7 4. Abnormal electrolyte  K 3.1 5. Ordered repletion with: per protocol 6. If a panic level lab has been reported, has the CCM MD in charge been notified? Yes.  .   Physician:  Illene Labrador, [place] 12/12/2018 5:21 AM

## 2018-12-12 NOTE — Progress Notes (Deleted)
Assisted tele visit to patient with daughter.  Jezabel Lecker William, RN  

## 2018-12-12 NOTE — Progress Notes (Signed)
SLP Cancellation Note  Patient Details Name: Walter Grimes MRN: [id number] DOB: 03-Aug-1990   Cancelled treatment:       Reason Eval/Treat Not Completed: Fatigue/lethargy limiting ability to participate. Pt woke up for approximately 20 seconds and responded to two questions but was unable to maintain alertness for long enough periods to participate in treatment. His case was discussed with RN and she attributed his reduced alertness to medications given this morning. SLP will follow up on a subsequent date.   Quanta Roher I. [name], [place], [place] Office number [phone] Pager [place] 12/12/2018, 11:43 AM

## 2018-12-12 NOTE — Plan of Care (Signed)
Called mom twice for update but she was not available.  I called patient girlfriend (pt had verbal consent that we need to give her girlfriend update also) and had long discussion with your friend over the phone, updated pt current condition, treatment plan and potential prognosis. She expressed understanding and appreciation.   Rosalin Hawking, MD PhD Stroke Neurology 12/12/2018 6:43 PM

## 2018-12-13 LAB — BASIC METABOLIC PANEL
Anion gap: 12 (ref 5–15)
BUN: 7 mg/dL (ref 6–20)
CO2: 22 mmol/L (ref 22–32)
Calcium: 9.5 mg/dL (ref 8.9–10.3)
Chloride: 104 mmol/L (ref 98–111)
Creatinine, Ser: 0.91 mg/dL (ref 0.61–1.24)
GFR calc Af Amer: >60 mL/min (ref >60)
GFR calc non Af Amer: >60 mL/min (ref >60)
Glucose, Bld: 99 mg/dL (ref 70–99)
Potassium: 3.8 mmol/L (ref 3.5–5.1)
Sodium: 138 mmol/L (ref 135–145)

## 2018-12-13 LAB — CBC
HCT: 47.1 % (ref 39.0–52.0)
Hemoglobin: 16.7 g/dL (ref 13.0–17.0)
MCH: 31.3 pg (ref 26.0–34.0)
MCHC: 35.5 g/dL (ref 30.0–36.0)
MCV: 88.2 fL (ref 80.0–100.0)
Platelets: 276 10*3/uL (ref 150–400)
RBC: 5.34 MIL/uL (ref 4.22–5.81)
RDW: 12.3 % (ref 11.5–15.5)
WBC: 12.4 10*3/uL — ABNORMAL HIGH (ref 4.0–10.5)
nRBC: 0 % (ref 0.0–0.2)

## 2018-12-13 LAB — MAGNESIUM: Magnesium: 2.1 mg/dL (ref 1.7–2.4)

## 2018-12-13 MED ORDER — VALPROATE SODIUM 500 MG/5ML IV SOLN
1500.0000 mg | Freq: Once | INTRAVENOUS | Status: AC
  Administered 2018-12-13: 1500 mg via INTRAVENOUS
  Filled 2018-12-13: qty 15

## 2018-12-13 MED ORDER — TRAMADOL HCL 50 MG PO TABS
100.0000 mg | ORAL_TABLET | Freq: Three times a day (TID) | ORAL | Status: DC | PRN
  Administered 2018-12-13 (×2): 100 mg via ORAL
  Filled 2018-12-13 (×2): qty 2

## 2018-12-13 MED ORDER — VALPROATE SODIUM 500 MG/5ML IV SOLN
500.0000 mg | Freq: Three times a day (TID) | INTRAVENOUS | Status: DC
  Filled 2018-12-13 (×3): qty 5

## 2018-12-13 MED ORDER — VALPROATE SODIUM 500 MG/5ML IV SOLN
500.0000 mg | Freq: Three times a day (TID) | INTRAVENOUS | Status: DC
  Administered 2018-12-13 – 2018-12-15 (×5): 500 mg via INTRAVENOUS
  Filled 2018-12-13 (×7): qty 5

## 2018-12-13 MED ORDER — ALPRAZOLAM 0.25 MG PO TABS
0.2500 mg | ORAL_TABLET | Freq: Three times a day (TID) | ORAL | Status: DC
  Administered 2018-12-13 – 2018-12-19 (×18): 0.25 mg via ORAL
  Filled 2018-12-13 (×19): qty 1

## 2018-12-13 MED ORDER — DOCUSATE SODIUM 100 MG PO CAPS
100.0000 mg | ORAL_CAPSULE | Freq: Every day | ORAL | Status: DC
  Filled 2018-12-13: qty 1

## 2018-12-13 NOTE — Progress Notes (Signed)
Assisted tele visit to patient with mother.  Haward Pope Ann, RN  

## 2018-12-13 NOTE — Progress Notes (Signed)
EEG maint complete. No skin breakdown. Reapplied forehead and ekg leads. Sitter at bedside/ continue to monitor

## 2018-12-13 NOTE — Progress Notes (Signed)
STROKE TEAM PROGRESS NOTE   INTERVAL HISTORY Pt awake and interactive but seems very anxious and hyperventilates at times but can be calmed down easily, able to answer orientation questions. Has safety sitter at bedside.  He had possible y`day but no other witnessed seizures since then. LTEM shows few electrograhic seizures without clinical correlation and focal left sided slowing  Vitals:   12/13/18 0600 12/13/18 0700 12/13/18 0800 12/13/18 0900  BP: (!) 147/83 140/85 140/87 130/86  Pulse: (!) 54 (!) 57 61 93  Resp: 11 15 15 19   Temp:   99.5 F (37.5 C)   TempSrc:   Axillary   SpO2: 96% 93% 97% 97%  Weight:      Height:        CBC:  Recent Labs  Lab 12/12/18 0424 12/13/18 0648  WBC 14.2* 12.4*  HGB 16.0 16.7  HCT 44.9 47.1  MCV 87.0 88.2  PLT 261 276    Basic Metabolic Panel:  Recent Labs  Lab 12/12/18 0424 12/13/18 0648  NA 138 138  K 3.1* 3.8  CL 102 104  CO2 24 22  GLUCOSE 100* 99  BUN 7 7  CREATININE 0.89 0.91  CALCIUM 9.5 9.5  MG  --  2.1   Lipid Panel:     Component Value Date/Time   CHOL 149 12/10/2018 0145   TRIG 67 12/10/2018 0145   HDL 43 12/10/2018 0145   CHOLHDL 3.5 12/10/2018 0145   VLDL 13 12/10/2018 0145   LDLCALC 93 12/10/2018 0145   HgbA1c:  Lab Results  Component Value Date   HGBA1C 5.3 12/10/2018   Urine Drug Screen:     Component Value Date/Time   LABOPIA NONE DETECTED 12/09/2018 0456   COCAINSCRNUR NONE DETECTED 12/09/2018 0456   LABBENZ NONE DETECTED 12/09/2018 0456   AMPHETMU NONE DETECTED 12/09/2018 0456   THCU POSITIVE (A) 12/09/2018 0456   LABBARB NONE DETECTED 12/09/2018 0456    Alcohol Level     Component Value Date/Time   ETH <10 12/08/2018 2339    IMAGING  Ct Head Wo Contrast 12/12/2018 IMPRESSION:  1. Continued evolution of posterior left frontal and parietal hematoma without significant expansion. There may be some contraction of the clot with developing edema.  2. Stable extension of hemorrhage into the  corpus callosum.  3. Intraventricular hemorrhage without significant hydrocephalus.   LTM-EEG Report 12/13/18 SUMMARY: This was an abnormal continuous video EEG due to left anterior focal slowing with some intermixed rhythmic activity. Some brief focal electrographic seizures were observed without clinical signs.   PHYSICAL EXAM Temp:  [98.3 F (36.8 C)-99.5 F (37.5 C)] 99.5 F (37.5 C) (06/20 0800) Pulse Rate:  [50-111] 93 (06/20 0900) Resp:  [9-27] 19 (06/20 0900) BP: (128-159)/(62-95) 130/86 (06/20 0900) SpO2:  [93 %-100 %] 97 % (06/20 0900)  General - frail young african [place] male not in distress but quite anxiousl.  Ophthalmologic - fundi not visualized due to noncooperation.  Cardiovascular - Regular rate and rhythm.  Neuro -anxious but  awake and interactive, oriented to place, age, name but not to time or situation. Language not cooperative on testing, but able to form short sentences, mild dysarthria.  . Right facial droop, PERRL, eye midline, able to track bilaterally. Right hemiplegia 0/5 with flaccidity.. LUE and LLE spontaneous movement. Right UE and LE hypotonia with positive babinski. Sensation, coordination not cooperative and gait not tested.   ASSESSMENT/PLAN [name] is a 28 y.o. male with no significant past medical history presenting  post seizure with L parietal frontal ICH seen on CT.   New onset Seizure - likely secondary to [place]  ?? Possible seizure 2 weeks ago  Keppra 2000 mg load followed by 750 bid on admission but increased anxiety may be side effect hence will transition to [place]  EEG normal  Recurrent "spells" preceded by agitation, 5-10 min w/o jerking  LTM EEG 12/12/18 left focal slowing with few electrographic seizures6/19/20 am episode of right gaze and unresponsiveness  Loaded again with keppra and keppra increase to 1000mg  bid  Put on LTM EEG again 12/12/18 -> left anterior focal slowing with some intermixed rhythmic  activity. Some brief focal electrographic seizures were observed without clinical signs.  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  Stable L IVH.  Trace SAH.  Stable 4 mm L to R shift.  MRA Unremarkable   Repeat CT head slight increase L IPH from 5.4 to 5.8 cm in AP diameter.  Repeat CT 6/17 stable hematoma and slight decrease of IVH but slight increase of midline shift  CT repeat 12/12/18 stable hematoma and no hydro  Consider MRI with contrast once cooperative to evaluate source of bleeding  2D Echo EF 60-65%  LDL 93  HgbA1c 5.3  HIV neg, RPR neg  Heparin subq for VTE prophylaxis  No antithrombotic prior to admission, now on No antithrombotic given hemorrhage  Therapy recommendations:  pending   Disposition:  pending   Cerebral Edema  Off 3% given small size of ICH  Na 139->141->138->139->140->137->138->138  Repeat CT 6/17 stable hematoma and slight decrease of IVH but slight increase of midline shift  CT repeat 12/12/18 stable hematoma and no hydro  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 Stress ulcer  Bloody vomitus on 12/09/18, now resolved  Concerning for stress ulcer  On protonix IV bid -> PO bid  CCM on board  Monitoring CBC  Leukocytosis  WBC 10.8->17.7->15.7->14.2->12.4  No fever  UA neg  CXR neg  CBC monitoring  Tobacco abuse  Current smoker  Smoking cessation counseling provided  Pt is willing to quit  Other Stroke Risk Factors  ETOH use, advised to drink no more than 2 drink(s) a day  Marijuana use w/ UDS positive for [facility] - education provided  Hypokalemia 3.2->3.7->3.5->3.1->3.8 - supplement and check Mg 6/20 (2.1)   Hospital day # 4 Patient seems quite anxious and near panic attacjks which maybe keppra side  effect hence will switch to depacon iv. Continue LTEM for 1 more day. Continue stroct BP control with SBP below 160. Start lovenox for DVT from 12/14/18. This patient is critically ill due to [place], IVH, new onset seizure,electrographic seizures,stress ulcer, cerebral edema and central sleep apnea and at significant risk of neurological worsening, death form respiratory failure, hematoma expansion, obstructive hydrocephalus, cerebral edema, brain herniation, status epilepticus and GI bleeding. This patient's care requires constant monitoring of vital signs, hemodynamics, respiratory and cardiac monitoring, review of multiple databases, neurological assessment, discussion with family, other specialists and medical decision making of high complexity. I spent 35 minutes of neurocritical care time in the care of this patient. I also discussed with Dr. Halford Chessman.  Antony Contras, MD Medical Director [place] Pager: [phone] 12/13/2018 1:02  PM   To contact Stroke Continuity provider, please refer to [email or web address] After hours, contact General Neurology

## 2018-12-13 NOTE — Plan of Care (Signed)
  Problem: Education: Goal: Knowledge of disease or condition will improve Outcome: Progressing   Problem: Coping: Goal: Will verbalize positive feelings about self Outcome: Progressing   Problem: Self-Care: Goal: Ability to participate in self-care as condition permits will improve Outcome: Progressing Goal: Verbalization of feelings and concerns over difficulty with self-care will improve Outcome: Progressing Goal: Ability to communicate needs accurately will improve Outcome: Progressing   Problem: Nutrition: Goal: Risk of aspiration will decrease Outcome: Progressing  Regular diet initiated, tolerating well.

## 2018-12-13 NOTE — Procedures (Signed)
LTM-EEG Report  HISTORY: Continuous video-EEG monitoring performed for 28 year old with L P ICH, seizures.  ACQUISITION: International 10-20 system for electrode placement; 18 channels with additional eyes linked to ipsilateral ears and EKG. Additional T1-T2 electrodes were used. Continuous video recording obtained.   EEG NUMBER:  MEDICATIONS:  Day 1:  See EMR  DAY #1: from 1125 12/12/18 to 0730 12/13/18   BACKGROUND: An overall medium voltage continuous recording with good spontaneous variability and reactivity. Waking background consisted of a medium voltage 9Hz  posterior dominant rhythm bilaterally with low voltage beta activity in the bilateral frontocentral regions and some medium voltage theta activity diffusely. Sleep was captured with normal stage II sleep architecture. There was frequent left anterior focal slowing (irregular 2-4Hz ) in waking and sleep. EPILEPTIFORM/PERIODIC ACTIVITY: Frequent short runs of rhythmic 6Hz  focal slowing, sharply contoured, in the left frontal region. SEIZURES: Some occasional subtle evolution in left frontal rhythmic 6hz  activity with increased rhythmicity and slowing of frequencies without clinical signs. EVENTS: none  EKG: no significant arrhythmia  SUMMARY: This was an abnormal continuous video EEG due to left anterior focal slowing with some intermixed rhythmic activity. Some brief focal electrographic seizures were observed without clinical signs.

## 2018-12-13 NOTE — Evaluation (Signed)
Clinical/Bedside Swallow Evaluation Patient Details  Name: Walter Grimes MRN: [id number] Date of Birth: 06-15-1991  Today's Date: 12/13/2018 Time: SLP Start Time (ACUTE ONLY): 1100 SLP Stop Time (ACUTE ONLY): 1110 SLP Time Calculation (min) (ACUTE ONLY): 10 min  Past Medical History: History reviewed. No pertinent past medical history. Past Surgical History: History reviewed. No pertinent surgical history. HPI:  Pt is a 28 y.o. male who presents to the ED after a seizure. EMS noted him to be postictal after the seizure, which continued to be present on arrival to the ED. He was not given Versed by EMS. Left hand tremor was noted by EMS. CT of the head of 12/09/18 revealed 5.8 x 2.2 cm left parietal hemorrhage with intraventricular extension. Repeat CT of the head showed slight increase in left parietal parenchymal hemorrhage from 5.4 to 5.8 cm in AP diameter. EEG was normal. CT of the head of 12/11/18: Lt parietal hemorrhage with vasogenic edema, extension into lateral ventricles, no hydrocephalus, 6 mm Lt to Rt shift.   Assessment / Plan / Recommendation Clinical Impression  Pt is anxious and hyperventilating at times during assessment, but ability to masticate and drink liquids was WNL and pt was able to control breathing to swallow. No choking observed. Pt may advance diet to regular. SLP will f/u for cognition.  SLP Visit Diagnosis: Dysphagia, unspecified (R13.10)    Aspiration Risk       Diet Recommendation Regular;Thin liquid   Liquid Administration via: Cup;Straw Medication Administration: Whole meds with liquid Supervision: Patient able to self feed    Other  Recommendations     Follow up Recommendations None      Frequency and Duration            Prognosis        Swallow Study   General HPI: Pt is a 28 y.o. male who presents to the ED after a seizure. EMS noted him to be postictal after the seizure, which continued to be present on arrival to the ED. He was not given  Versed by EMS. Left hand tremor was noted by EMS. CT of the head of 12/09/18 revealed 5.8 x 2.2 cm left parietal hemorrhage with intraventricular extension. Repeat CT of the head showed slight increase in left parietal parenchymal hemorrhage from 5.4 to 5.8 cm in AP diameter. EEG was normal. CT of the head of 12/11/18: Lt parietal hemorrhage with vasogenic edema, extension into lateral ventricles, no hydrocephalus, 6 mm Lt to Rt shift. Type of Study: Bedside Swallow Evaluation Previous Swallow Assessment: none Diet Prior to this Study: NPO Temperature Spikes Noted: No Respiratory Status: Room air Behavior/Cognition: Alert;Cooperative;Pleasant mood Oral Cavity Assessment: Within Functional Limits Self-Feeding Abilities: Able to feed self Patient Positioning: Upright in bed Baseline Vocal Quality: Normal Volitional Cough: Strong Volitional Swallow: Able to elicit    Oral/Motor/Sensory Function Overall Oral Motor/Sensory Function: Within functional limits   Ice Chips     Thin Liquid Thin Liquid: Within functional limits Presentation: Cup;Straw    Nectar Thick Nectar Thick Liquid: Not tested   Honey Thick Honey Thick Liquid: Not tested   Puree Puree: Within functional limits   Solid     Solid: Within functional limits     Herbie [place], MA [place] Pager [phone] Office [phone]  Lynann Beaver 12/13/2018,11:21 AM

## 2018-12-14 DIAGNOSIS — I610 Nontraumatic intracerebral hemorrhage in hemisphere, subcortical: Secondary | ICD-10-CM

## 2018-12-14 LAB — BASIC METABOLIC PANEL
Anion gap: 12 (ref 5–15)
BUN: 11 mg/dL (ref 6–20)
CO2: 25 mmol/L (ref 22–32)
Calcium: 9.8 mg/dL (ref 8.9–10.3)
Chloride: 99 mmol/L (ref 98–111)
Creatinine, Ser: 0.92 mg/dL (ref 0.61–1.24)
GFR calc Af Amer: >60 mL/min (ref >60)
GFR calc non Af Amer: >60 mL/min (ref >60)
Glucose, Bld: 99 mg/dL (ref 70–99)
Potassium: 4.1 mmol/L (ref 3.5–5.1)
Sodium: 136 mmol/L (ref 135–145)

## 2018-12-14 LAB — CBC
HCT: 47.7 % (ref 39.0–52.0)
Hemoglobin: 17 g/dL (ref 13.0–17.0)
MCH: 31.3 pg (ref 26.0–34.0)
MCHC: 35.6 g/dL (ref 30.0–36.0)
MCV: 87.7 fL (ref 80.0–100.0)
Platelets: 287 10*3/uL (ref 150–400)
RBC: 5.44 MIL/uL (ref 4.22–5.81)
RDW: 12.3 % (ref 11.5–15.5)
WBC: 11.4 10*3/uL — ABNORMAL HIGH (ref 4.0–10.5)
nRBC: 0 % (ref 0.0–0.2)

## 2018-12-14 LAB — VALPROIC ACID LEVEL: Valproic Acid Lvl: 57 ug/mL (ref 50.0–100.0)

## 2018-12-14 MED ORDER — TRAMADOL HCL 50 MG PO TABS
100.0000 mg | ORAL_TABLET | Freq: Four times a day (QID) | ORAL | Status: DC
  Administered 2018-12-14 – 2018-12-19 (×18): 100 mg via ORAL
  Filled 2018-12-14 (×22): qty 2

## 2018-12-14 NOTE — Procedures (Signed)
LTM-EEG Report  HISTORY: Continuous video-EEG monitoring performed for 28 year old with L P ICH, seizures.  ACQUISITION: International 10-20 system for electrode placement; 18 channels with additional eyes linked to ipsilateral ears and EKG. Additional T1-T2 electrodes were used. Continuous video recording obtained.   EEG NUMBER:  MEDICATIONS:  Day 1:  See EMR  DAY #1: from 0730 12/13/18 to 0730 12/14/18   BACKGROUND: An overall medium voltage continuous recording with good spontaneous variability and reactivity. Waking background consisted of a medium voltage 9Hz  posterior dominant rhythm bilaterally with low voltage beta activity in the bilateral frontocentral regions and some medium voltage theta activity diffusely. Sleep was captured with normal stage II sleep architecture. There was oscasional left anterior focal slowing (irregular 2-4Hz ) in sleep only. EPILEPTIFORM/PERIODIC ACTIVITY: Occasional brief (1-4 second) runs of rhythmic 6Hz  focal slowing, sharply contoured, in the left frontal region. These were only present in sleep and showed no ictal evolution.  SEIZURES: none EVENTS: none  EKG: no significant arrhythmia  SUMMARY: This was an abnormal continuous video EEG due to left anterior focal slowing with some intermixed rhythmic activity. No further seizures were seen. Focal rhythmic slowing has improved overnight with normalization of background during waking periodic.

## 2018-12-14 NOTE — Progress Notes (Signed)
EEG LTM d/c per Dr. Leonie Man. No skin breakdown

## 2018-12-14 NOTE — Progress Notes (Signed)
STROKE TEAM PROGRESS NOTE   INTERVAL HISTORY Pt has not had any clinical seizures.  Long-term EEG monitoring overnight also does not show electrographic seizures.  Patient remains intermittently anxious and panicky. BP well controlled.He is off cardene.Headache improved on tramadol  Vitals:   12/14/18 0500 12/14/18 0600 12/14/18 0700 12/14/18 0800  BP: (!) 143/91 (!) 130/50 (!) 148/94   Pulse: (!) 54 89 62   Resp: 15 (!) 26 20   Temp:    98 F (36.7 C)  TempSrc:    Oral  SpO2: 98% 99% 97%   Weight:      Height:        CBC:  Recent Labs  Lab 12/13/18 0648 12/14/18 0543  WBC 12.4* 11.4*  HGB 16.7 17.0  HCT 47.1 47.7  MCV 88.2 87.7  PLT 276 287    Basic Metabolic Panel:  Recent Labs  Lab 12/13/18 0648 12/14/18 0543  NA 138 136  K 3.8 4.1  CL 104 99  CO2 22 25  GLUCOSE 99 99  BUN 7 11  CREATININE 0.91 0.92  CALCIUM 9.5 9.8  MG 2.1  --    Lipid Panel:     Component Value Date/Time   CHOL 149 12/10/2018 0145   TRIG 67 12/10/2018 0145   HDL 43 12/10/2018 0145   CHOLHDL 3.5 12/10/2018 0145   VLDL 13 12/10/2018 0145   LDLCALC 93 12/10/2018 0145   HgbA1c:  Lab Results  Component Value Date   HGBA1C 5.3 12/10/2018   Urine Drug Screen:     Component Value Date/Time   LABOPIA NONE DETECTED 12/09/2018 0456   COCAINSCRNUR NONE DETECTED 12/09/2018 0456   LABBENZ NONE DETECTED 12/09/2018 0456   AMPHETMU NONE DETECTED 12/09/2018 0456   THCU POSITIVE (A) 12/09/2018 0456   LABBARB NONE DETECTED 12/09/2018 0456    Alcohol Level     Component Value Date/Time   ETH <10 12/08/2018 2339    IMAGING  Ct Head Wo Contrast 12/12/2018 IMPRESSION:  1. Continued evolution of posterior left frontal and parietal hematoma without significant expansion. There may be some contraction of the clot with developing edema.  2. Stable extension of hemorrhage into the corpus callosum.  3. Intraventricular hemorrhage without significant hydrocephalus.   LTM-EEG  Report 12/13/18 SUMMARY: This was an abnormal continuous video EEG due to left anterior focal slowing with some intermixed rhythmic activity. Some brief focal electrographic seizures were observed without clinical signs.  12/14/18 SUMMARY: This wasan abnormal continuous video EEG due to left anterior focal slowing with some intermixed rhythmic activity. No further seizures were seen. Focal rhythmic slowing has improved overnight with normalization of background during waking periodic.    PHYSICAL EXAM Temp:  [98 F (36.7 C)-98.9 F (37.2 C)] 98 F (36.7 C) (06/21 0800) Pulse Rate:  [54-102] 62 (06/21 0700) Resp:  [7-29] 20 (06/21 0700) BP: (130-156)/(50-106) 148/94 (06/21 0700) SpO2:  [93 %-100 %] 97 % (06/21 0700)  General - frail young african [place] male not in distress but quite anxiousl.  Ophthalmologic - fundi not visualized due to noncooperation.  Cardiovascular - Regular rate and rhythm.  Neuro -sleepy but arouses easilyve, oriented to place, age, name but not to time or situation. Language not cooperative on testing, but able to form short sentences, mild dysarthria.  . Right facial droop, PERRL, eye midline, able to track bilaterally. Right hemiplegia 0/5 with flaccidity.. LUE and LLE spontaneous movement. Right UE and LE hypotonia with positive babinski. Sensation, coordination not cooperative and gait not  tested.   ASSESSMENT/PLAN [name] is a 28 y.o. male with no significant past medical history presenting post seizure with L parietal frontal ICH seen on CT.   New onset Seizure - likely secondary to [place]  ?? Possible seizure 2 weeks ago  Keppra 2000 mg load followed by 750 bid on admission but increased anxiety may be side effect hence will transition to [place]  EEG normal  Recurrent "spells" preceded by agitation, 5-10 min w/o jerking  LTM EEG 12/12/18 left focal slowing with few electrographic seizures6/19/20 am episode of right gaze and  unresponsiveness  Loaded again with keppra and keppra increase to 1000mg  bid  Put on LTM EEG again 12/12/18 -> left anterior focal slowing with some intermixed rhythmic activity. Some brief focal electrographic seizures were observed without clinical signs. 12/14/18 -> left anterior focal slowing with some intermixed rhythmic activity. No further seizures were seen. Focal rhythmic slowing has improved overnight with normalization of background during waking periodic.   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  Stable L IVH.  Trace SAH.  Stable 4 mm L to R shift.  MRA Unremarkable   Repeat CT head slight increase L IPH from 5.4 to 5.8 cm in AP diameter.  Repeat CT 6/17 stable hematoma and slight decrease of IVH but slight increase of midline shift  CT repeat 12/12/18 stable hematoma and no hydro  Consider MRI with contrast once cooperative to evaluate source of bleeding  2D Echo EF 60-65%  LDL 93  HgbA1c 5.3  HIV neg, RPR neg  Heparin subq for VTE prophylaxis  No antithrombotic prior to admission, now on No antithrombotic given hemorrhage  Therapy recommendations:  pending   Disposition:  pending   Cerebral Edema  Off 3% given small size of ICH  Na 139->141->138->139->140->137->138->138->136  Repeat CT 6/17 stable hematoma and slight decrease of IVH but slight increase of midline shift  CT repeat 12/12/18 stable hematoma and no hydro  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 Stress ulcer  Bloody vomitus on 12/09/18, now resolved  Concerning for stress ulcer  On protonix IV bid -> PO bid  CCM on board  Monitoring CBC  Leukocytosis  WBC 10.8->17.7->15.7->14.2->12.4->11.4  No fever  UA neg  CXR neg   CBC monitoring  Tobacco abuse  Current  smoker  Smoking cessation counseling provided  Pt is willing to quit  Other Stroke Risk Factors  ETOH use, advised to drink no more than 2 drink(s) a day  Marijuana use w/ UDS positive for THC - education provided  Hypokalemia 3.2->3.7->3.5->3.1->3.8 ->4.1  Anxiety - Keppra changed to [facility] day # 5 Patient seems stable without recurrent seizures.Discontinue LTEM  . Continue strict BP control with SBP below 160. Start lovenox for DVT today. Check valproic acid level. Transfer to neurology floor bed. DC Keppra due to anxiety/panic . I have spent a total of  35  minutes with the patient reviewing hospital notes,  test results, labs and examining the patient as well as establishing an assessment and plan that was discussed personally with the patient.  > 50% of time was spent in direct patient care.   Antony Contras, MD Medical Director [facility] Pager: [phone] 12/14/2018 12:07 PM      To contact Stroke Continuity provider, please refer to [email or web address]. After hours, contact General Neurology

## 2018-12-15 MED ORDER — VALPROATE SODIUM 500 MG/5ML IV SOLN
750.0000 mg | Freq: Three times a day (TID) | INTRAVENOUS | Status: DC
  Administered 2018-12-15 – 2018-12-19 (×10): 750 mg via INTRAVENOUS
  Filled 2018-12-15 (×15): qty 7.5

## 2018-12-15 NOTE — Progress Notes (Signed)
STROKE TEAM PROGRESS NOTE   INTERVAL HISTORY Patient continues to not have any more seizures but gets intermittently agitated.  He is getting Xanax.  He also complains of headaches.  He does get Fioricet and tramadol alternatingly.  Plan transfer to the floor today.    Vitals:   12/15/18 0600 12/15/18 0700 12/15/18 0737 12/15/18 0800  BP: 134/90 139/87  (!) 141/88  Pulse: (!) 59 65 68 95  Resp: 12 (!) 25 18 (!) 22  Temp:   98.1 F (36.7 C)   TempSrc:      SpO2: 95% 95% 96% 95%  Weight:      Height:        CBC:  Recent Labs  Lab 12/13/18 0648 12/14/18 0543  WBC 12.4* 11.4*  HGB 16.7 17.0  HCT 47.1 47.7  MCV 88.2 87.7  PLT 276 734    Basic Metabolic Panel:  Recent Labs  Lab 12/13/18 0648 12/14/18 0543  NA 138 136  K 3.8 4.1  CL 104 99  CO2 22 25  GLUCOSE 99 99  BUN 7 11  CREATININE 0.91 0.92  CALCIUM 9.5 9.8  MG 2.1  --    IMAGING past 24h No results found.   PHYSICAL EXAM  General - frail young african [place] male not in distress but quite anxiousl.  Ophthalmologic - fundi not visualized due to noncooperation.  Cardiovascular - Regular rate and rhythm.  Neuro -sleepy but arouses easily , oriented to place, age, name but not to time or situation. Language not cooperative on testing, but able to form short sentences, mild dysarthria.  . Right facial droop, PERRL, eye midline, able to track bilaterally. Right hemiplegia 0/5 with flaccidity.. LUE and LLE spontaneous movement. Right UE and LE hypotonia with positive babinski. Sensation, coordination not cooperative and gait not tested.   ASSESSMENT/PLAN [name] is a 28 y.o. male with no significant past medical history presenting post seizure with L parietal frontal ICH seen on CT.   New onset Seizure - likely secondary to [place]  ?? Possible seizure 2 weeks ago  Keppra 2000 mg load followed by 750 bid on admission but increased anxiety may be side effect hence transitioned to [place]  EEG  normal  Recurrent "spells" preceded by agitation, 5-10 min w/o jerking  LTM EEG 12/12/18 left focal slowing with few electrographic seizures6/19/20 am episode of right gaze and unresponsiveness  Loaded again with keppra and keppra increase to 1000mg  bid  Put on LTM EEG again 12/12/18 -> left anterior focal slowing with some intermixed rhythmic activity. Some brief focal electrographic seizures were observed without clinical signs.  12/14/18 -> left anterior focal slowing with some intermixed rhythmic activity. No further seizures were seen. Focal rhythmic slowing has improved overnight with normalization of background during waking periodic.   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  Stable L IVH.  Trace SAH.  Stable 4 mm L to R shift.  MRA Unremarkable   Repeat CT head slight increase L IPH from 5.4 to 5.8 cm in AP diameter.  Repeat CT 6/17 stable hematoma and slight decrease of IVH but slight increase of midline shift  CT repeat 12/12/18 stable hematoma and no hydro  Consider MRI with contrast once cooperative to evaluate source of bleeding  2D Echo EF 60-65%  LDL 93  HgbA1c 5.3  HIV neg, RPR neg  Heparin subq for VTE prophylaxis  No antithrombotic prior to admission, now on No antithrombotic given hemorrhage  Therapy recommendations:  CIR  Disposition:  pending   Cerebral Edema, resolved  Off 3% given small size of ICH  Na 136  Repeat CT 6/17 stable hematoma and slight decrease of IVH but slight increase of midline shift  CT repeat 12/12/18 stable hematoma and no hydro  Central sleep APNEA, improving  Felt to be secondary to IVH  ABG normal  Did not tolerate bipap  ?? Stress ulcer  Bloody vomitus on 12/09/18, now resolved  Concerning for stress ulcer  On protonix IV bid ->  PO bid  HGB stable 17.0  Leukocytosis  WBC 11.4  No fever  UA neg  CXR neg   CBC monitoring  Tobacco abuse  Current smoker  Smoking cessation counseling provided  Pt is willing to quit  Other Stroke Risk Factors  ETOH use, advised to drink no more than 2 drink(s) a day  Marijuana use w/ UDS positive for THC - cessation education provided  Hypokalemia, resolved 4.1  Transfer to the floor Transfer to [facility] for ongoing management  Hospital day # 6 Plan increase Depacon to 750 mg every 8 hourly.  Continue to use tramadol and Fioricet for headache.  Continue therapy consults.  Mobilize out of bed.  Transfer to neurology floor bed.  Will consult medical hospitalist team to take over his primary team after his transfer to the floor.  Greater than 50% time during this 35-minute visit was spent on counseling and coordination of care about his intracerebral hemorrhage, seizures, headache and discussion with care team   Delia HeadyPramod Carlise Stofer, MD Medical Director [facility] Pager: [phone] 12/15/2018 10:53 AM      To contact Stroke Continuity provider, please refer to [email or web address]. After hours, contact General Neurology

## 2018-12-15 NOTE — Progress Notes (Addendum)
Physical Therapy Treatment Patient Details Name: Walter Grimes MRN: [id number] DOB: 1991-03-31 Today's Date: 12/15/2018    History of Present Illness Pt is a 28 y.o. M who presents after a seizure. CT 6/16 showing left parietal hemorrhage with intraventricular extension. Repeat CT 6/18 showing slight increase in left parietal parenchymal hemorrhage. Normal EEG.     PT Comments    Patient progressing slowly towards PT goals. Pt alert and interactive today. Very anxious during all mobility exhibited by rapid RR and breathing heavily. Other VSS. Requires assist of 2 for bed mobility, standing and squat pivot transfer to chair. Pt able to initiate activation of hip/trunk extensors but not sustain. No activation noted on right trunk. Pt with distal activation of RUE/LE (hands and toes) but no muscle activation noted proximally. Pt with right inattention, poor awareness, right hemiparesis, and impaired balance. Sensation WFL right side. Very interested in CIR. Would be a great candidate. Will continue to follow.    Follow Up Recommendations  CIR;Supervision/Assistance - 24 hour     Equipment Recommendations  Other (comment)(defer)    Recommendations for Other Services       Precautions / Restrictions Precautions Precautions: Fall Precaution Comments: anxious Restrictions Weight Bearing Restrictions: No    Mobility  Bed Mobility Overal bed mobility: Needs Assistance Bed Mobility: Supine to Sit     Supine to sit: Max assist;+2 for physical assistance;HOB elevated     General bed mobility comments: Able to initiate bringing LLE to EOB but difficulty problem solving getting [name] to EOB; assist with BLEs, trunk and cues to attend to RUE.  Transfers Overall transfer level: Needs assistance Equipment used: 2 person hand held assist(back of recliner) Transfers: Sit to/from [employer] CorporationStand;Squat Pivot Transfers Sit to Stand: Mod assist;+2 physical assistance   Squat pivot transfers: +2  physical assistance;Max assist     General transfer comment: Assist of 2 to power to standing with pt pulling up on back of recliner with LUE; flexed posture. Cues for hip/trunk extension and support for right knee due to buckling. Anxious. Squat pivot transfer to chair towards left with drop arm, Max A of 2.  Ambulation/Gait             General Gait Details: Unable   Stairs             Wheelchair Mobility    Modified Rankin (Stroke Patients Only) Modified Rankin (Stroke Patients Only) Pre-Morbid Rankin Score: No symptoms Modified Rankin: Severe disability     Balance Overall balance assessment: Needs assistance Sitting-balance support: Feet supported;Single extremity supported Sitting balance-Leahy Scale: Poor Sitting balance - Comments: Requires varying assist to maintain static sitting balance- anywhere from min A-Max A. Pt with complete LOB x2 to right when letting go of LUE. No trunk activation on right side. Anterior lean, right lateral lean. Poor awareness of body/balance. Postural control: Right lateral lean;Posterior lean Standing balance support: During functional activity Standing balance-Leahy Scale: Zero Standing balance comment: Requires Max A of 2 and support of back of chair with LUE to maintain static standing. Cues provided for hip/trunk extension manually and pt able to initiate but not sustain. Requires right knee to be blocked to prevent buckling.                            Cognition Arousal/Alertness: Awake/alert Behavior During Therapy: Anxious Overall Cognitive Status: Impaired/Different from baseline Area of Impairment: Awareness;Following commands;Safety/judgement;Problem solving  Following Commands: Follows one step commands consistently;Follows multi-step commands inconsistently Safety/Judgement: Decreased awareness of safety Awareness: Emergent Problem Solving: Decreased initiation;Difficulty  sequencing;Requires verbal cues;Requires tactile cues General Comments: Pt very anxious during session with increased RR and loud fast rapid breaths. Some right inattention noted. Pt letting go with LUE and having complete LOB when sitting EOB.      Exercises      General Comments General comments (skin integrity, edema, etc.): VSS throughout. RR increased to 20 due to anxiety. Misses kids that are 3, 4 and 5.      Pertinent Vitals/Pain Pain Assessment: Faces Faces Pain Scale: Hurts little more Pain Location: head Pain Descriptors / Indicators: Headache Pain Intervention(s): Repositioned;Monitored during session    Home Living                      Prior Function            PT Goals (current goals can now be found in the care plan section) Progress towards PT goals: Progressing toward goals    Frequency    Min 4X/week      PT Plan Current plan remains appropriate    Co-evaluation              AM-PAC PT "6 Clicks" Mobility   Outcome Measure  Help needed turning from your back to your side while in a flat bed without using bedrails?: A Lot Help needed moving from lying on your back to sitting on the side of a flat bed without using bedrails?: Total Help needed moving to and from a bed to a chair (including a wheelchair)?: Total Help needed standing up from a chair using your arms (e.g., wheelchair or bedside chair)?: Total Help needed to walk in hospital room?: Total Help needed climbing 3-5 steps with a railing? : Total 6 Click Score: 7    End of Session Equipment Utilized During Treatment: Gait belt Activity Tolerance: Patient tolerated treatment well Patient left: in chair;with call bell/phone within reach;with chair alarm set;with nursing/sitter in room(left lift pad in chair) Nurse Communication: Mobility status(lift to transfer pt back to bed) PT Visit Diagnosis: Other abnormalities of gait and mobility (R26.89);Hemiplegia and hemiparesis;Other  symptoms and signs involving the nervous system (R29.898) Hemiplegia - Right/Left: Right Hemiplegia - dominant/non-dominant: Dominant Hemiplegia - caused by: Nontraumatic intracerebral hemorrhage     Time: 1404-1430 PT Time Calculation (min) (ACUTE ONLY): 26 min  Charges:  $Therapeutic Activity: 8-22 mins $Neuromuscular Re-education: 8-22 mins                     Wray Kearns, PT, DPT Acute Rehabilitation Services Pager [phone] Office [place] 12/15/2018, 3:01 PM

## 2018-12-15 NOTE — Progress Notes (Signed)
Report called to [facility] on 3W.

## 2018-12-15 NOTE — Progress Notes (Signed)
Patient mother called and updated on patient moving to 3W room 16. Will continue to monitor patient.

## 2018-12-16 ENCOUNTER — Inpatient Hospital Stay (HOSPITAL_COMMUNITY): Payer: Medicaid Other

## 2018-12-16 LAB — GLUCOSE, CAPILLARY: Glucose-Capillary: 106 mg/dL — ABNORMAL HIGH (ref 70–99)

## 2018-12-16 MED ORDER — BISACODYL 10 MG RE SUPP
10.0000 mg | Freq: Every day | RECTAL | Status: DC | PRN
  Administered 2018-12-16 – 2018-12-17 (×2): 10 mg via RECTAL
  Filled 2018-12-16 (×2): qty 1

## 2018-12-16 MED ORDER — POLYETHYLENE GLYCOL 3350 17 G PO PACK
17.0000 g | PACK | Freq: Every day | ORAL | Status: DC | PRN
  Administered 2018-12-16 – 2018-12-18 (×2): 17 g via ORAL
  Filled 2018-12-16 (×2): qty 1

## 2018-12-16 MED ORDER — GADOBUTROL 1 MMOL/ML IV SOLN
7.5000 mL | Freq: Once | INTRAVENOUS | Status: AC | PRN
  Administered 2018-12-16: 7.5 mL via INTRAVENOUS

## 2018-12-16 NOTE — Progress Notes (Signed)
Occupational Therapy Treatment Patient Details Name: Walter Grimes MRN: [id number] DOB: [birth year] Today's Date: 12/16/2018    History of present illness Pt is a 28 y.o. M who presents after a seizure. CT 6/16 showing left parietal hemorrhage with intraventricular extension. Repeat CT 6/18 showing slight increase in left parietal parenchymal hemorrhage. Normal EEG.    OT comments  Treatment session with focus on Rt attention, RUE NMR, trunk control during sitting and standing, and functional transfers.  [facility].  Engaged in sit > stand in [place] with focus on midline orientation and increased awareness of positioning of RUE during transitional movements and sitting.  Pt reports improving sensation in Rt hand with ability to feel therapist touch on hand and able to feel and grasp Stedy bar.  Engaged in grooming tasks in partial sitting on Stedy flaps with focus on trunk control, min cues for trunk control during grooming tasks.  Pt alluding to dislike of looking at self in mirror.  Pt soft spoken and withdrawn but engaged in therapeutic activities and self-care, would benefit from visitor present as pt appears to be struggling emotionally with current level of function.  Discussed use of video chatting on phone until visitor restrictions lifted.   Follow Up Recommendations  CIR;Supervision/Assistance - 24 hour    Equipment Recommendations   TBD      Precautions / Restrictions Precautions Precautions: Fall Precaution Comments: anxious Restrictions Weight Bearing Restrictions: No       Mobility Bed Mobility Overal bed mobility: Needs Assistance Bed Mobility: Sit to Supine     Supine to sit: Mod assist;+2 for physical assistance     General bed mobility comments: Pt was received sitting on BSC with RN  Transfers Overall transfer level: Needs assistance   Transfers: Sit to/from Stand Sit to Stand: Mod assist;+2 safety/equipment         General transfer  comment: +2 helpful for safety, positioning of R hand, and to maintain truncal alignment while sitting/standing with Stedy.     Balance Overall balance assessment: Needs assistance Sitting-balance support: Feet supported;Single extremity supported Sitting balance-Leahy Scale: Poor   Postural control: Right lateral lean;Posterior lean Standing balance support: During functional activity Standing balance-Leahy Scale: Poor                             ADL either performed or assessed with clinical judgement   ADL Overall ADL's : Needs assistance/impaired     Grooming: Wash/dry face;Oral care;Brushing hair;Minimal assistance Grooming Details (indicate cue type and reason): in partial sitting on Stedy.  Pt required assistance for setup of items (to open toothpaste, hold spit cup) Upper Body Bathing: Minimal assistance   Lower Body Bathing: +2 for physical assistance;Sit to/from stand           Toilet Transfer: +2 for physical assistance;BSC;Moderate assistance Toilet Transfer Details (indicate cue type and reason): Stedy for transfer         Functional mobility during ADLs: +2 for physical assistance General ADL Comments: pt tearful and withdrawn intermittently during session, very depressed with current situation.  Pt would benefit from visitor presence for increased motivation               Cognition Arousal/Alertness: Awake/alert Behavior During Therapy: Anxious Overall Cognitive Status: Impaired/Different from baseline Area of Impairment: Awareness;Following commands;Safety/judgement;Problem solving                   Current  Attention Level: Focused Memory: Decreased short-term memory Following Commands: Follows one step commands consistently;Follows multi-step commands with increased time Safety/Judgement: Decreased awareness of safety Awareness: Emergent Problem Solving: Decreased initiation;Difficulty sequencing;Requires verbal cues;Requires  tactile cues General Comments: Pt very anxious during session with increased RR and loud fast rapid breaths. Some right inattention noted, which seemed to improve by end of session.                    Pertinent Vitals/ Pain       Pain Assessment: Faces Pain Score: 9  Faces Pain Scale: Hurts a little bit Pain Location: head Pain Descriptors / Indicators: Aching Pain Intervention(s): Monitored during session         Frequency  Min 2X/week        Progress Toward Goals  OT Goals(current goals can now be found in the care plan section)  Progress towards OT goals: Progressing toward goals  Acute Rehab OT Goals Patient Stated Goal: unable OT Goal Formulation: Patient unable to participate in goal setting Time For Goal Achievement: 12/25/18 Potential to Achieve Goals: Good  Plan Discharge plan remains appropriate    Co-evaluation    PT/OT/SLP Co-Evaluation/Treatment: Yes Reason for Co-Treatment: Complexity of the patient's impairments (multi-system involvement);For patient/therapist safety;Necessary to address cognition/behavior during functional activity;To address functional/ADL transfers PT goals addressed during session: Mobility/safety with mobility;Balance;Proper use of DME;Strengthening/ROM OT goals addressed during session: ADL's and self-care;Strengthening/ROM      AM-PAC OT "6 Clicks" Daily Activity     Outcome Measure   Help from another person eating meals?: A Lot Help from another person taking care of personal grooming?: Total Help from another person toileting, which includes using toliet, bedpan, or urinal?: Total Help from another person bathing (including washing, rinsing, drying)?: A Lot Help from another person to put on and taking off regular upper body clothing?: A Lot Help from another person to put on and taking off regular lower body clothing?: Total 6 Click Score: 9    End of Session Equipment Utilized During Treatment: Gait  belt(Stedy)  OT Visit Diagnosis: Muscle weakness (generalized) (M62.81);Other symptoms and signs involving cognitive function;Other symptoms and signs involving the nervous system (R29.898);Hemiplegia and hemiparesis Hemiplegia - Right/Left: Right Hemiplegia - dominant/non-dominant: Dominant 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

## 2018-12-16 NOTE — Progress Notes (Signed)
  Speech Language Pathology Treatment: Cognitive-Linquistic  Patient Details Name: Walter Grimes MRN: [id number] DOB: 12-Sep-1990 Today's Date: 12/16/2018 Time: 3532-9924 SLP Time Calculation (min) (ACUTE ONLY): 25 min  Assessment / Plan / Recommendation Clinical Impression  Pt was seen for cognitive-linguistic treatment. His level of alertness and participation were notably improved compared to that which was noted during the initial evaluation. He was oriented to place and partially oriented to time with need for cues for the correct month, date, and day. He demonstrated 100% accuracy with 3-item immediate recall but achieved 60% accuracy with recall of 4 items and 20% accuracy with recall of 5 items both increasing to 100% accuracy with mod cues. He achieved 100% accuracy with abstract reasoning. He was able to tell the time on visually-presented clocks with 60% accuracy increasing to 100% accuracy with min-mod cues. He required max cues for time word problems but was able to do clock math with 20% accuracy when visuals of clocks were shown increasing to 80% accuracy with mod cues. He achieved 25% accuracy with mental manipulation tasks increasing to 100% accuracy with mod-max cues. SLP will continue to follow pt.    HPI HPI: Pt is a 28 y.o. male who presents to the ED after a seizure. EMS noted him to be postictal after the seizure, which continued to be present on arrival to the ED. He was not given Versed by EMS. Left hand tremor was noted by EMS. CT of the head of 12/09/18 revealed 5.8 x 2.2 cm left parietal hemorrhage with intraventricular extension. Repeat CT of the head showed slight increase in left parietal parenchymal hemorrhage from 5.4 to 5.8 cm in AP diameter. EEG was normal. CT of the head of 12/11/18: Lt parietal hemorrhage with vasogenic edema, extension into lateral ventricles, no hydrocephalus, 6 mm Lt to Rt shift.      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 [name], [place], [place] Office number [phone] Pager [place] 12/16/2018, 11:40 AM

## 2018-12-16 NOTE — Progress Notes (Signed)
Physical Therapy Treatment Patient Details Name: Walter Grimes MRN: [id number] DOB: [birth year] Today's Date: 12/16/2018    History of Present Illness Pt is a 28 y.o. M who presents after a seizure. CT 6/16 showing left parietal hemorrhage with intraventricular extension. Repeat CT 6/18 showing slight increase in left parietal parenchymal hemorrhage. Normal EEG.     PT Comments    Pt seen with OT for max safety with transfers and functional mobility. Focus of session was pt engagement with ADL tasks at the sink, and transfer training with Stedy. Pt motivated to brush teeth and comb hair, however admits to feeling very frustrated during attempts, crying at times. +2 assist was helpful throughout, and recommend use of Stedy for transfers with nursing staff.  Pt continues to be an excellent CIR candidate, and feel he could benefit from a visitor present as he appears to be struggling emotionally with current level of function. Will continue to follow and progress as able per POC.    Follow Up Recommendations  CIR;Supervision/Assistance - 24 hour     Equipment Recommendations  Other (comment)(defer)    Recommendations for Other Services       Precautions / Restrictions Precautions Precautions: Fall Precaution Comments: anxious Restrictions Weight Bearing Restrictions: No    Mobility  Bed Mobility               General bed mobility comments: Pt was received sitting on BSC with  Transfers Overall transfer level: Needs assistance   Transfers: Sit to/from Stand Sit to Stand: Mod assist;+2 safety/equipment         General transfer comment: +2 helpful for safety, positioning of R hand, and to maintain truncal alignment while sitting/standing with Stedy.   Ambulation/Gait             General Gait Details: Unable   Stairs             Wheelchair Mobility    Modified Rankin (Stroke Patients Only) Modified Rankin (Stroke Patients Only) Pre-Morbid Rankin  Score: No symptoms Modified Rankin: Severe disability     Balance Overall balance assessment: Needs assistance Sitting-balance support: Feet supported;Single extremity supported Sitting balance-Leahy Scale: Poor   Postural control: Right lateral lean;Posterior lean Standing balance support: During functional activity Standing balance-Leahy Scale: Poor                              Cognition Arousal/Alertness: Awake/alert Behavior During Therapy: Anxious Overall Cognitive Status: Impaired/Different from baseline Area of Impairment: Awareness;Following commands;Safety/judgement;Problem solving                   Current Attention Level: Focused Memory: Decreased short-term memory Following Commands: Follows one step commands consistently;Follows multi-step commands with increased time Safety/Judgement: Decreased awareness of safety Awareness: Emergent Problem Solving: Decreased initiation;Difficulty sequencing;Requires verbal cues;Requires tactile cues General Comments: Pt very anxious during session with increased RR and loud fast rapid breaths. Some right inattention noted, which seemed to improve by end of session.       Exercises      General Comments        Pertinent Vitals/Pain Pain Assessment: Faces Pain Score: 9  Faces Pain Scale: Hurts a little bit Pain Location: head Pain Descriptors / Indicators: Aching Pain Intervention(s): Monitored during session    Home Living                      Prior Function  PT Goals (current goals can now be found in the care plan section) Acute Rehab PT Goals Patient Stated Goal: unable PT Goal Formulation: Patient unable to participate in goal setting Time For Goal Achievement: 12/25/18 Potential to Achieve Goals: Good Progress towards PT goals: Progressing toward goals    Frequency    Min 4X/week      PT Plan Current plan remains appropriate    Co-evaluation PT/OT/SLP  Co-Evaluation/Treatment: Yes Reason for Co-Treatment: Complexity of the patient's impairments (multi-system involvement);Necessary to address cognition/behavior during functional activity;For patient/therapist safety;To address functional/ADL transfers PT goals addressed during session: Mobility/safety with mobility;Balance;Proper use of DME;Strengthening/ROM        AM-PAC PT "6 Clicks" Mobility   Outcome Measure  Help needed turning from your back to your side while in a flat bed without using bedrails?: A Lot Help needed moving from lying on your back to sitting on the side of a flat bed without using bedrails?: Total Help needed moving to and from a bed to a chair (including a wheelchair)?: Total Help needed standing up from a chair using your arms (e.g., wheelchair or bedside chair)?: A Lot Help needed to walk in hospital room?: Total Help needed climbing 3-5 steps with a railing? : Total 6 Click Score: 8    End of Session Equipment Utilized During Treatment: Gait belt Activity Tolerance: Patient tolerated treatment well Patient left: in bed;with call bell/phone within reach;with bed alarm set Nurse Communication: Mobility status;Other (comment)(Pt uncomfortable, wanting to have a BM but states he can't. ) PT Visit Diagnosis: Other abnormalities of gait and mobility (R26.89);Hemiplegia and hemiparesis;Other symptoms and signs involving the nervous system (R29.898) Hemiplegia - Right/Left: Right Hemiplegia - dominant/non-dominant: Dominant Hemiplegia - caused by: Nontraumatic intracerebral hemorrhage     Time: 1018-1100 PT Time Calculation (min) (ACUTE ONLY): 42 min  Charges:  $Therapeutic Activity: 8-22 mins                     Conni SlipperLaura Asbury Hair, PT, DPT Acute Rehabilitation Services Pager: [phone] Office: [phone]    Marylynn PearsonLaura D Jaxon Flatt 12/16/2018, 11:38 AM

## 2018-12-16 NOTE — Progress Notes (Signed)
PROGRESS NOTE    Walter Grimes  WUJ:[id number] DOB: 03-Oct-1990 DOA: 12/08/2018 PCP: Patient, No Pcp Per   Brief Narrative:  Walter Grimes is an 28 y.o. male who presented to the ED on 12/09/2018 after a seizure. Per RN who spoke with his girlfriend: "pt was picking up girlfriend when he texted her that he was not feeling well, gf went outside and saw pt was on the ground stating he was having a heart attack and vomited at that time. Pt then was helped to the car and was put in the car and still talking when he started to seizure per girlfriend that his whole body was twitching and not responding for 3 minutes per girlfriend then pt woke up and ems was called by a coworker that was at the girlfriends job. When ems arrived pt was still twitching and ems told girlfriend that pt was still seizing but for girlfriend stated that pt did not look like he was seizing any more but just twitching and more alert."   EMS noted him to be postictal after the seizure, which continued to be present on arrival to the ED. He was not given Versed by EMS. Left hand tremor was noted by EMS.   The patient at baseline apparently has no weakness and is fully functional. However, he had a possible seizure about two weeks ago; he was seen for this but was not diagnosed with a seizure disorder and not started on an anticonvulsant.   Initial CT of the head showed left parietal frontal ICH.  Patient was admitted under neurology in ICU service with a diagnosis of intracranial hemorrhage as well as seizure.  Patient was transferred to medical floor on 12/15/2018 under hospitalist service as primary team.  Consultants:   Neurology  Procedures:   None  Antimicrobials:   None   Subjective: Patient seen and examined.  He is alert and oriented however he complains of his " body not moving".  He did not have any specific complaint.  Objective: Vitals:   12/15/18 1500 12/15/18 1955 12/16/18 0355 12/16/18 0700  BP:  124/77 113/69 132/81 (!) 122/97  Pulse: 65 67 65 79  Resp: 16 16 19 19   Temp: 97.6 F (36.4 C) 97.6 F (36.4 C) 98.3 F (36.8 C) 97.6 F (36.4 C)  TempSrc: Axillary Axillary Oral Axillary  SpO2: 98% 97% 99% 100%  Weight:      Height:        Intake/Output Summary (Last 24 hours) at 12/16/2018 0804 Last data filed at 12/15/2018 1700 Gross per 24 hour  Intake 297.5 ml  Output 600 ml  Net -302.5 ml   Filed Weights   12/09/18 0342  Weight: 76.6 kg    Examination:  General exam: Appears calm and comfortable  Respiratory system: Clear to auscultation. Respiratory effort normal. Cardiovascular system: S1 & S2 heard, RRR. No JVD, murmurs, rubs, gallops or clicks. No pedal edema. Gastrointestinal system: Abdomen is nondistended, soft and nontender. No organomegaly or masses felt. Normal bowel sounds heard. Central nervous system: Alert and oriented.  Right hemiparesis Extremities: 5 out of 5 power in left upper and lower extremity however reduced strength in right upper and lower extremity. Skin: No rashes, lesions or ulcers Psychiatry: Judgement and insight appear normal. Mood & affect flat   Data Reviewed: I have personally reviewed following labs and imaging studies  CBC: Recent Labs  Lab 12/10/18 0145 12/11/18 0544 12/12/18 0424 12/13/18 0648 12/14/18 0543  WBC 17.7* 15.7* 14.2*  12.4* 11.4*  HGB 14.1 15.5 16.0 16.7 17.0  HCT 40.6 43.7 44.9 47.1 47.7  MCV 89.8 88.1 87.0 88.2 87.7  PLT 270 259 261 276 235   Basic Metabolic Panel: Recent Labs  Lab 12/10/18 0145 12/10/18 0757 12/11/18 0544 12/12/18 0424 12/13/18 0648 12/14/18 0543  NA 139 140 137 138 138 136  K 3.7  --  3.5 3.1* 3.8 4.1  CL 113*  --  104 102 104 99  CO2 20*  --  23 24 22 25   GLUCOSE 127*  --  119* 100* 99 99  BUN 9  --  8 7 7 11   CREATININE 0.98  --  1.00 0.89 0.91 0.92  CALCIUM 9.0  --  9.4 9.5 9.5 9.8  MG  --   --   --   --  2.1  --    GFR: Estimated Creatinine Clearance: 114.6 mL/min  (by C-G formula based on SCr of 0.92 mg/dL). Liver Function Tests: No results for input(s): AST, ALT, ALKPHOS, BILITOT, PROT, ALBUMIN in the last 168 hours. No results for input(s): LIPASE, AMYLASE in the last 168 hours. No results for input(s): AMMONIA in the last 168 hours. Coagulation Profile: No results for input(s): INR, PROTIME in the last 168 hours. Cardiac Enzymes: No results for input(s): CKTOTAL, CKMB, CKMBINDEX, TROPONINI in the last 168 hours. BNP (last 3 results) No results for input(s): PROBNP in the last 8760 hours. HbA1C: No results for input(s): HGBA1C in the last 72 hours. CBG: Recent Labs  Lab 12/16/18 0132  GLUCAP 106*   Lipid Profile: No results for input(s): CHOL, HDL, LDLCALC, TRIG, CHOLHDL, LDLDIRECT in the last 72 hours. Thyroid Function Tests: No results for input(s): TSH, T4TOTAL, FREET4, T3FREE, THYROIDAB in the last 72 hours. Anemia Panel: No results for input(s): VITAMINB12, FOLATE, FERRITIN, TIBC, IRON, RETICCTPCT in the last 72 hours. Sepsis Labs: No results for input(s): PROCALCITON, LATICACIDVEN in the last 168 hours.  Recent Results (from the past 240 hour(s))  SARS Coronavirus 2     Status: None   Collection Time: 12/09/18  1:03 AM  Result Value Ref Range Status   SARS Coronavirus 2 NOT DETECTED NOT DETECTED Final    Comment: (NOTE) SARS-CoV-2 target nucleic acids are NOT DETECTED. The SARS-CoV-2 RNA is generally detectable in upper and lower respiratory specimens during the acute phase of infection.  Negative  results do not preclude SARS-CoV-2 infection, do not rule out co-infections with other pathogens, and should not be used as the sole basis for treatment or other patient management decisions.  Negative results must be combined with clinical observations, patient history, and epidemiological information. The expected result is Not Detected. Fact Sheet for Patients:  http://www.biofiredefense.com/wp-content/uploads/2020/03/BIOFIRE-COVID -19-patients.pdf Fact Sheet for Healthcare Providers: http://www.biofiredefense.com/wp-content/uploads/2020/03/BIOFIRE-COVID -19-hcp.pdf This test is not yet approved or cleared by the [place] and  has been authorized for detection and/or diagnosis of SARS-CoV-2 by FDA under an Emergency Use Authorization (EUA).  This EUA will remain in effec t (meaning this test can be used) for the duration of  the COVID-19 declaration under Section 564(b)(1) of the Act, 21 U.S.C. section 360bbb-3(b)(1), unless the authorization is terminated or revoked sooner. Performed at [facility], [place] [address], [place], [place] [address]   MRSA PCR Screening     Status: None   Collection Time: 12/09/18  3:44 AM   Specimen: Nasal Mucosa; Nasopharyngeal  Result Value Ref Range Status   MRSA by PCR NEGATIVE NEGATIVE Final    Comment:  The GeneXpert MRSA Assay (FDA approved for NASAL specimens only), is one component of a comprehensive MRSA colonization surveillance program. It is not intended to diagnose MRSA infection nor to guide or monitor treatment for MRSA infections. Performed at [facility], [address] [place], [place] [address]       Radiology Studies: No results found.  Scheduled Meds: .  stroke: mapping our early stages of recovery book   Does not apply Once  . ALPRAZolam  0.25 mg Oral TID  . Chlorhexidine Gluconate Cloth  6 each Topical Daily  . heparin injection (subcutaneous)  5,000 Units Subcutaneous Q8H  . pantoprazole  40 mg Oral Daily  . senna-docusate  1 tablet Oral BID  . traMADol  100 mg Oral Q6H   Continuous Infusions: . sodium chloride 250 mL (12/15/18 2300)  . valproate sodium 750 mg (12/15/18 2308)     LOS: 7 days   Assessment & Plan:   Active Problems:   ICH (intracerebral hemorrhage) (HCC)  New onset seizure: Per neurology, this could very well be  secondary to ICH.  He has been loaded with Keppra couple of times and currently he is on Depacon 750 mg every 8 hours.  Management per neurology.  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  Etiology unclear.  Recent CT scan shows evaluation and stabilization.  Further management deferred to neurology.  Cerebral edema: Resolved.  He is off of 3% now.  Sodium stable and normal.  Hematemesis: Had an episode of hematemesis on 12/09/2018.  No repeat episodes since then.  Hemoglobin is stable.  Continue Protonix twice daily.  DVT prophylaxis: Will initiate SCD. Code Status: Full code Family Communication: No family present at the bedside. Disposition Plan: To be determined based on clinical course and PT assessment.   Time spent: 30 minutes   Hughie Clossavi Dent Plantz, MD [facility] Pager [phone]  If 7PM-7AM, please contact night-coverage [email or web address] Password [email or web address] 12/16/2018, 8:04 AM

## 2018-12-16 NOTE — Progress Notes (Signed)
STROKE TEAM PROGRESS NOTE   INTERVAL HISTORY Patient is neurologically stable with no more seizures.  He continues to complain of headaches.  He however appears quite comfortable despite subjective complaints of headache.  He has been seen by therapy who recommended inpatient rehab.  Vitals:   12/16/18 0355 12/16/18 0700 12/16/18 0939 12/16/18 1300  BP: 132/81 (!) 122/97 124/88 (!) 119/97  Pulse: 65 79 80 70  Resp: 19 19 20 20   Temp: 98.3 F (36.8 C) 97.6 F (36.4 C)  (!) 97.5 F (36.4 C)  TempSrc: Oral Axillary  Axillary  SpO2: 99% 100% 96% 100%  Weight:      Height:        CBC:  Recent Labs  Lab 12/13/18 0648 12/14/18 0543  WBC 12.4* 11.4*  HGB 16.7 17.0  HCT 47.1 47.7  MCV 88.2 87.7  PLT 276 287    Basic Metabolic Panel:  Recent Labs  Lab 12/13/18 0648 12/14/18 0543  NA 138 136  K 3.8 4.1  CL 104 99  CO2 22 25  GLUCOSE 99 99  BUN 7 11  CREATININE 0.91 0.92  CALCIUM 9.5 9.8  MG 2.1  --    IMAGING past 24h No results found.   PHYSICAL EXAM  General - frail young african [place] male not in distress but quite anxiousl.  Ophthalmologic - fundi not visualized due to noncooperation.  Cardiovascular - Regular rate and rhythm.  Neuro -sleepy but arouses easily , oriented to place, age, name but not to time or situation. Language not cooperative on testing, but able to form short sentences, mild dysarthria.  . Right facial droop, PERRL, eye midline, able to track bilaterally. Right hemiplegia 0/5 with flaccidity.. LUE and LLE spontaneous movement. Right UE and LE hypotonia with positive babinski. Sensation, coordination not cooperative and gait not tested.   ASSESSMENT/PLAN [name] is a 28 y.o. male with no significant past medical history presenting post seizure with L parietal frontal ICH seen on CT.   New onset Seizure - likely secondary to ICH  ?? Possible seizure 2 weeks ago  Keppra 2000 mg load followed by 750 bid on admission but  increased anxiety may be side effect hence transitioned to Deapacon  EEG normal  Recurrent "spells" preceded by agitation, 5-10 min w/o jerking  LTM EEG 12/12/18 left focal slowing with few electrographic seizures6/19/20 am episode of right gaze and unresponsiveness  Loaded again with keppra and keppra increase to 1000mg  bid  Put on LTM EEG again 12/12/18 -> left anterior focal slowing with some intermixed rhythmic activity. Some brief focal electrographic seizures were observed without clinical signs.  12/14/18 -> left anterior focal slowing with some intermixed rhythmic activity. No further seizures were seen. Focal rhythmic slowing has improved overnight with normalization of background during waking periodic.   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  Stable L IVH.  Trace SAH.  Stable 4 mm L to R shift.  MRA Unremarkable   Repeat CT head slight increase L IPH from 5.4 to 5.8 cm in AP diameter.  Repeat CT 6/17 stable hematoma and slight decrease of IVH but slight increase of midline shift  CT repeat 12/12/18 stable hematoma and no hydro   Plan  MRI with contrast once cooperative to  evaluate source of bleeding   2D Echo EF 60-65%  LDL 93  HgbA1c 5.3  HIV neg, RPR neg  Heparin subq for VTE prophylaxis  No antithrombotic prior to admission, now on No antithrombotic given hemorrhage  Therapy recommendations:  CIR  Disposition:  pending   Cerebral Edema, resolved  Off 3% given small size of ICH  Na 136  Repeat CT 6/17 stable hematoma and slight decrease of IVH but slight increase of midline shift  CT repeat 12/12/18 stable hematoma and no hydro  Central sleep APNEA, improving  Felt to be secondary to IVH  ABG normal  Did not tolerate bipap  ?? Stress ulcer  Bloody vomitus on  12/09/18, now resolved  Concerning for stress ulcer  On protonix IV bid -> PO bid  HGB stable 17.0  Leukocytosis  WBC 11.4  No fever  UA neg  CXR neg   CBC monitoring  Tobacco abuse  Current smoker  Smoking cessation counseling provided  Pt is willing to quit  Other Stroke Risk Factors  ETOH use, advised to drink no more than 2 drink(s) a day  Marijuana use w/ UDS positive for THC - cessation education provided  Hypokalemia, resolved 4.1  Continue Depacon   for seizures.  Continue tramadol and Fioricet for headaches.  Check MRI scan of the brain with contrast to l/f underlying vascular tumor as cause of bleed..D/W [facility] day # 7   Greater than 50% time during this 25-minute visit was spent on counseling and coordination of care about his intracerebral hemorrhage, seizures, headache and discussion with care team   Antony Contras, [place] Pager: [phone] 12/16/2018 2:37 PM      To contact Stroke Continuity provider, please refer to [email or web address] After hours, contact General Neurology

## 2018-12-16 NOTE — Progress Notes (Signed)
RN arrived to room and pt was on floor. Not reporting any pain. MD notified. VS stable.

## 2018-12-16 NOTE — Progress Notes (Addendum)
Inpatient Rehabilitation Admissions Coordinator   Inpatient Rehab Consult received. I met with patient at the bedside for rehabilitation assessment. With his permission I contacted his Mom and Girlfriend by phone. Mom is in agreement to assist with disability and medicaid applications. Patient lives with his girlfriend in a 2 level apartment. She works part time. Mom is disabled and states pt is unable to come live with her due to space issues. I have encouraged both Mom and girlfriend to contact each other to discuss what assistance they can provide him for he will need 24/7 assist after any rehab venue. I will contact Financial counselor to assist with disability and Medicaid . I will follow up with pt's progress with therapy.  Danne Baxter, RN, MSN Rehab Admissions Coordinator [phone] 12/16/2018 12:42 PM

## 2018-12-17 DIAGNOSIS — E876 Hypokalemia: Secondary | ICD-10-CM

## 2018-12-17 LAB — CBC WITH DIFFERENTIAL/PLATELET
Abs Immature Granulocytes: 0.03 10*3/uL (ref 0.00–0.07)
Basophils Absolute: 0 10*3/uL (ref 0.0–0.1)
Basophils Relative: 0 %
Eosinophils Absolute: 0.1 10*3/uL (ref 0.0–0.5)
Eosinophils Relative: 1 %
HCT: 47.6 % (ref 39.0–52.0)
Hemoglobin: 17.1 g/dL — ABNORMAL HIGH (ref 13.0–17.0)
Immature Granulocytes: 0 %
Lymphocytes Relative: 35 %
Lymphs Abs: 2.6 10*3/uL (ref 0.7–4.0)
MCH: 31.9 pg (ref 26.0–34.0)
MCHC: 35.9 g/dL (ref 30.0–36.0)
MCV: 88.8 fL (ref 80.0–100.0)
Monocytes Absolute: 0.7 10*3/uL (ref 0.1–1.0)
Monocytes Relative: 10 %
Neutro Abs: 3.9 10*3/uL (ref 1.7–7.7)
Neutrophils Relative %: 54 %
Platelets: 293 10*3/uL (ref 150–400)
RBC: 5.36 MIL/uL (ref 4.22–5.81)
RDW: 12.1 % (ref 11.5–15.5)
WBC: 7.3 10*3/uL (ref 4.0–10.5)
nRBC: 0 % (ref 0.0–0.2)

## 2018-12-17 LAB — COMPREHENSIVE METABOLIC PANEL
ALT: 47 U/L — ABNORMAL HIGH (ref 0–44)
AST: 51 U/L — ABNORMAL HIGH (ref 15–41)
Albumin: 4 g/dL (ref 3.5–5.0)
Alkaline Phosphatase: 95 U/L (ref 38–126)
Anion gap: 12 (ref 5–15)
BUN: 20 mg/dL (ref 6–20)
CO2: 28 mmol/L (ref 22–32)
Calcium: 9.7 mg/dL (ref 8.9–10.3)
Chloride: 93 mmol/L — ABNORMAL LOW (ref 98–111)
Creatinine, Ser: 1 mg/dL (ref 0.61–1.24)
GFR calc Af Amer: >60 mL/min (ref >60)
GFR calc non Af Amer: >60 mL/min (ref >60)
Glucose, Bld: 125 mg/dL — ABNORMAL HIGH (ref 70–99)
Potassium: 3.8 mmol/L (ref 3.5–5.1)
Sodium: 133 mmol/L — ABNORMAL LOW (ref 135–145)
Total Bilirubin: 1 mg/dL (ref 0.3–1.2)
Total Protein: 7.6 g/dL (ref 6.5–8.1)

## 2018-12-17 LAB — MAGNESIUM: Magnesium: 2.3 mg/dL (ref 1.7–2.4)

## 2018-12-17 NOTE — Progress Notes (Signed)
STROKE TEAM PROGRESS NOTE   INTERVAL HISTORY Patient is neurologically stable with no more seizures.  He continues to complain of headaches.    He has been seen by therapy who recommended inpatient rehab.  MRI scan of the brain with contrast shows no enhancing lesions or tumor  Vitals:   12/17/18 0320 12/17/18 0728 12/17/18 1124 12/17/18 1539  BP: 124/87 122/78 126/87 (!) 121/91  Pulse: 80 70 90 75  Resp: 17 14 14 18   Temp: 98.7 F (37.1 C) 98.7 F (37.1 C) 98.3 F (36.8 C) 98.6 F (37 C)  TempSrc: Oral Oral Oral Oral  SpO2: 97% 100% 100% 100%  Weight:      Height:        CBC:  Recent Labs  Lab 12/14/18 0543 12/17/18 0510  WBC 11.4* 7.3  NEUTROABS  --  3.9  HGB 17.0 17.1*  HCT 47.7 47.6  MCV 87.7 88.8  PLT 287 293    Basic Metabolic Panel:  Recent Labs  Lab 12/13/18 0648 12/14/18 0543 12/17/18 0510  NA 138 136 133*  K 3.8 4.1 3.8  CL 104 99 93*  CO2 22 25 28   GLUCOSE 99 99 125*  BUN 7 11 20   CREATININE 0.91 0.92 1.00  CALCIUM 9.5 9.8 9.7  MG 2.1  --  2.3   IMAGING past 24h Mr Brain W Contrast  Result Date: 12/16/2018 CLINICAL DATA:  Intracranial hemorrhage follow up EXAM: MRI HEAD WITH CONTRAST TECHNIQUE: Multiplanar, multiecho pulse sequences of the brain and surrounding structures were obtained with intravenous contrast. CONTRAST:  7.5 mL Gadavist COMPARISON:  Head CT 12/12/2018 Brain MRI without contrast 12/09/2018 FINDINGS: Mildly motion degraded study. There is unchanged intraparenchymal hematoma within the left parietal lobe with intraventricular extension and involvement of the corpus callosum. There is intrinsic T1WI-hyperintensity surrounding the hemorrhage. There is no abnormal contrast enhancement. Small amount of left convexity subarachnoid blood, unchanged. Unchanged size and configuration of the ventricles. IMPRESSION: No abnormal contrast enhancement. Unchanged left parietal hemorrhage extending into the ventricles, corpus callosum and left  convexity subarachnoid space. Electronically Signed   By: Deatra RobinsonKevin  Herman M.D.   On: 12/16/2018 20:22     PHYSICAL EXAM  General - frail young african [place] male not in distress but quite anxiousl.  Ophthalmologic - fundi not visualized due to noncooperation.  Cardiovascular - Regular rate and rhythm.  Neuro -sleepy but arouses easily , oriented to place, age, name but not to time or situation. Language not cooperative on testing, but able to form short sentences, mild dysarthria.  . Right facial droop, PERRL, eye midline, able to track bilaterally. Right hemiplegia 0/5 with flaccidity.. LUE and LLE spontaneous movement. Right UE and LE hypotonia with positive babinski. Sensation, coordination not cooperative and gait not tested.   ASSESSMENT/PLAN [name] is a 28 y.o. male with no significant past medical history presenting post seizure with L parietal frontal ICH seen on CT.   New onset Seizure - likely secondary to ICH  ?? Possible seizure 2 weeks ago  Keppra 2000 mg load followed by 750 bid on admission but increased anxiety may be side effect hence transitioned to [place]  EEG normal  Recurrent "spells" preceded by agitation, 5-10 min w/o jerking  LTM EEG 12/12/18 left focal slowing with few electrographic seizures6/19/20 am episode of right gaze and unresponsiveness  Loaded again with keppra and keppra increase to 1000mg  bid  Put on LTM EEG again 12/12/18 -> left anterior focal slowing with some intermixed rhythmic  activity. Some brief focal electrographic seizures were observed without clinical signs.  12/14/18 -> left anterior focal slowing with some intermixed rhythmic activity. No further seizures were seen. Focal rhythmic slowing has improved overnight with normalization of background during waking periodic.   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  Stable L IVH.  Trace SAH.  Stable 4 mm L to R shift.  MRA Unremarkable   Repeat CT head slight increase L IPH from 5.4 to 5.8 cm in AP diameter.  Repeat CT 6/17 stable hematoma and slight decrease of IVH but slight increase of midline shift  CT repeat 12/12/18 stable hematoma and no hydro  MRI with contrast no enhancing lesions or tumors   2D Echo EF 60-65%  LDL 93  HgbA1c 5.3  HIV neg, RPR neg  Heparin subq for VTE prophylaxis  No antithrombotic prior to admission, now on No antithrombotic given hemorrhage  Therapy recommendations:  CIR  Disposition:  pending   Cerebral Edema, resolved  Off 3% given small size of ICH  Na 136  Repeat CT 6/17 stable hematoma and slight decrease of IVH but slight increase of midline shift  CT repeat 12/12/18 stable hematoma and no hydro  Central sleep APNEA, improving  Felt to be secondary to IVH  ABG normal  Did not tolerate bipap  ?? Stress ulcer  Bloody vomitus on 12/09/18, now resolved  Concerning for stress ulcer  On protonix IV bid -> PO bid  HGB stable 17.0  Leukocytosis  WBC 11.4  No fever  UA neg  CXR neg   CBC monitoring  Tobacco abuse  Current smoker  Smoking cessation counseling provided  Pt is willing to quit  Other Stroke Risk Factors  ETOH use, advised to drink no more than 2 drink(s) a day  Marijuana use w/ UDS positive for THC - cessation education provided  Hypokalemia, resolved 4.1  Continue Depacon   for seizures.  Continue tramadol and Fioricet for headaches  Hospital day # [place], [place] [facility] Pager: [phone] 12/17/2018 3:51 PM      To contact Stroke Continuity provider, please refer to [email or web address] After hours, contact General Neurology

## 2018-12-17 NOTE — Progress Notes (Signed)
Physical Therapy Treatment Patient Details Name: Walter Grimes MRN: [id number] DOB: [birth year] Today's Date: 12/17/2018    History of Present Illness Pt is a 28 y.o. M who presents after a seizure. CT 6/16 showing left parietal hemorrhage with intraventricular extension. Repeat CT 6/18 showing slight increase in left parietal parenchymal hemorrhage. Normal EEG.     PT Comments    Pt progressing towards physical therapy goals. Continues to be withdrawn at times however overall rehab effort was good. Pt tolerated ~8 minutes standing with Stedy participating in pre-gait activity. Breathing appears less forced and labored today overall. He was educated on attending to R side, utilizing strong L side to position and move RUE/LE, and need for nutrition. Pt had 2 trays in his room that were untouched. Pt was encouraged to order his own meal trays so he can get something that he actually wants to eat, and advised to try and get some protein in him if nothing else. Continue to feel that this patient would thrive in the CIR setting, and hopeful more intense therapies will help with his current emotional state.    Follow Up Recommendations  CIR;Supervision/Assistance - 24 hour     Equipment Recommendations  (TBD by next venue of care)    Recommendations for Other Services       Precautions / Restrictions Precautions Precautions: Fall Precaution Comments: anxious Restrictions Weight Bearing Restrictions: No    Mobility  Bed Mobility Overal bed mobility: Needs Assistance Bed Mobility: Sit to Supine     Supine to sit: Max assist     General bed mobility comments: Pt able to initiate movement however required max assist to elevate trunk to full sitting position. Bed pad utilized to scoot pt out fully to EOB.   Transfers Overall transfer level: Needs assistance Equipment used: 2 person hand held assist Transfers: Sit to/from [facility] Sit to Stand: Mod assist;+2  safety/equipment Stand pivot transfers: Mod assist;+2 physical assistance;+2 safety/equipment       General transfer comment: +2 for power-up to full standing position. Pt required knee blocking on the R and was able to advance L foot without assist, however therapist advanced R foot for pt with max assist. Seated rest break immediately after getting to chair.   Ambulation/Gait             General Gait Details: Pt participated in pre-gait activity standing with the Stedy. Pt attempted 2x5 TKE in standing on the R with therapist facilitating full extension of R knee. Pt was able to weight shift R and L and march his LLE up x5 as well with therapist blocking R knee.    Stairs             Wheelchair Mobility    Modified Rankin (Stroke Patients Only) Modified Rankin (Stroke Patients Only) Pre-Morbid Rankin Score: No symptoms Modified Rankin: Severe disability     Balance Overall balance assessment: Needs assistance Sitting-balance support: Feet supported;Single extremity supported Sitting balance-Leahy Scale: Poor Sitting balance - Comments: Requires varying assist to maintain static sitting balance- anywhere from min A-Max A. Pt with complete LOB x2 to right when letting go of LUE. No trunk activation on right side. Anterior lean, right lateral lean. Poor awareness of body/balance. Postural control: Right lateral lean;Posterior lean Standing balance support: During functional activity Standing balance-Leahy Scale: Poor  Cognition Arousal/Alertness: Awake/alert Behavior During Therapy: Anxious Overall Cognitive Status: Impaired/Different from baseline Area of Impairment: Awareness;Following commands;Safety/judgement;Problem solving                   Current Attention Level: Sustained Memory: Decreased short-term memory Following Commands: Follows one step commands consistently;Follows multi-step commands with increased  time Safety/Judgement: Decreased awareness of safety Awareness: Emergent Problem Solving: Decreased initiation;Difficulty sequencing;Requires verbal cues;Requires tactile cues General Comments: R inattention noted, even with cues to attend to it.       Exercises      General Comments        Pertinent Vitals/Pain Pain Assessment: Faces Faces Pain Scale: Hurts little more Pain Location: head Pain Descriptors / Indicators: Aching Pain Intervention(s): Monitored during session;Patient requesting pain meds-RN notified    Home Living                      Prior Function            PT Goals (current goals can now be found in the care plan section) Acute Rehab PT Goals Patient Stated Goal: "I want to walk again." PT Goal Formulation: Patient unable to participate in goal setting Time For Goal Achievement: 12/25/18 Potential to Achieve Goals: Good Progress towards PT goals: Progressing toward goals    Frequency    Min 4X/week      PT Plan Current plan remains appropriate    Co-evaluation PT/OT/SLP Co-Evaluation/Treatment: Yes            AM-PAC PT "6 Clicks" Mobility   Outcome Measure  Help needed turning from your back to your side while in a flat bed without using bedrails?: A Lot Help needed moving from lying on your back to sitting on the side of a flat bed without using bedrails?: A Lot Help needed moving to and from a bed to a chair (including a wheelchair)?: A Lot Help needed standing up from a chair using your arms (e.g., wheelchair or bedside chair)?: A Lot Help needed to walk in hospital room?: Total Help needed climbing 3-5 steps with a railing? : Total 6 Click Score: 10    End of Session Equipment Utilized During Treatment: Gait belt Activity Tolerance: Patient tolerated treatment well Patient left: in bed;with call bell/phone within reach;with bed alarm set Nurse Communication: Mobility status;Patient requests pain meds PT Visit  Diagnosis: Other abnormalities of gait and mobility (R26.89);Hemiplegia and hemiparesis;Other symptoms and signs involving the nervous system (R29.898) Hemiplegia - Right/Left: Right Hemiplegia - dominant/non-dominant: Dominant Hemiplegia - caused by: Nontraumatic intracerebral hemorrhage     Time: [id number] PT Time Calculation (min) (ACUTE ONLY): 45 min  Charges:  $Gait Training: 38-52 mins                     Walter Grimes, PT, DPT Acute Rehabilitation Services Pager: [phone] Office: [phone]    Walter Grimes 12/17/2018, 12:55 PM

## 2018-12-17 NOTE — Progress Notes (Signed)
  Speech Language Pathology Treatment: Cognitive-Linquistic  Patient Details Name: Walter Grimes MRN: [id number] DOB: 1990/12/13 Today's Date: 12/17/2018 Time: 4174-0814 SLP Time Calculation (min) (ACUTE ONLY): 19 min  Assessment / Plan / Recommendation Clinical Impression  Pt was seen for cognitive-linguistic treatment and was cooperative throughout the session. He achieved  60% accuracy with recall of objective of information from voice mails increasing to 80% with min-mod cues. He was unable to complete backward digit span tasks despite mod cues but completed 3-digit sequencing tasks with 25% accuracy. He achieved 67% accuracy with a medication management (prescription) task increasing to 100% with min. cues. Pt's session was terminated prematurely due to his complaint of cognitive-linguistic tasks making his headache worse. SLP will continue to follow pt.    HPI HPI: Pt is a 28 y.o. male who presents to the ED after a seizure. EMS noted him to be postictal after the seizure, which continued to be present on arrival to the ED. He was not given Versed by EMS. Left hand tremor was noted by EMS. CT of the head of 12/09/18 revealed 5.8 x 2.2 cm left parietal hemorrhage with intraventricular extension. Repeat CT of the head showed slight increase in left parietal parenchymal hemorrhage from 5.4 to 5.8 cm in AP diameter. EEG was normal. CT of the head of 12/11/18: Lt parietal hemorrhage with vasogenic edema, extension into lateral ventricles, no hydrocephalus, 6 mm Lt to Rt shift.      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

## 2018-12-17 NOTE — Progress Notes (Signed)
PROGRESS NOTE    Walter Grimes  ZOX:[id number] DOB: 02/27/1991 DOA: 12/08/2018 PCP: Patient, No Pcp Per   Brief Narrative: As per prior attending: 28 y.o.malewho presented to the ED on 12/09/2018 after a seizure. Per RN who spoke with his girlfriend: "pt was picking up girlfriend when he texted her that he was not feeling well, gf went outside and saw pt was on the ground stating he was having a heart attack and vomited at that time. Pt then was helped to the car and was put in the car and still talking when he started to seizure per girlfriend that his whole body was twitching and not responding for 3 minutes per girlfriend then pt woke up and ems was called by a coworker that was at the girlfriends job. When ems arrived pt was still twitching and ems told girlfriend that pt was still seizing but for girlfriend stated that pt did not look like he was seizing any more but just twitching and more alert."   EMS noted him to be postictal after the seizure, which continued to be present on arrival to the ED. He was not given Versed by EMS. Left hand tremor was noted by EMS.   The patient at baseline apparently has no weakness and is fully functional. However, he had a possible seizureabout twoweeks ago; he was seen for this but was not diagnosed with a seizure disorder and not started on an anticonvulsant.  Initial CT of the head showed left parietal frontal ICH.  Patient was admitted under neurology in ICU service with a diagnosis of intracranial hemorrhage as well as seizure.  Patient was transferred to medical floor on 12/15/2018 under hospitalist service as primary team  Patient is seen by PT OT,CIR has been recommended.  Subjective: Appears alert awake oriented, seems to be depressed.  Denies nausea vomiting headache.  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 Seizure 2 weeks ago: Initially on Keppra but due to increased anxiety switched to Depakote, valproic  acid level 57, continue.Seen by neurology and signed off.Continue tramadol/Fioricet for headache control.  Continue PT OT, awaiting for inpatient rehab.  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  CT head 5.2 x 2.2 left parietal ICH IVH.  MRI brain is stable with Medial parietal ICH extending into the corpus callosum, stable l IVH.  CT repeat 6/19 stable hematoma and no Hydro.MRI brain repeated with contrast 6/23, unchanged and no obvious source of bleeding.Stroke work-up with LDL 93,Hb A1c 5.3. 2D echo EF 60 to 65% negative,HIV/negative RPR.  Cerebral edema: Resolved  Central apnea, likely secondary to IVH, did not tolerate BiPAP.  ABG normal.  Improving.  Tobacco abuse/ETOH use: Cessation advised  Suspected depression/anxious: Given his overall new medical condition. on xanax. can consider Celexa   Episode of hematemesis 6/16, no recurrence, ? stress ulcer.  Continue PPI twice daily.  Hemoglobin stable.  DVT prophylaxis: Heparin SQ Code Status: FULL Family Communication: poc DISCUSSED W PT Disposition Plan: awaiting on CIR  Consultants:  Neurology  Procedures:  Antimicrobials: Anti-infectives (From admission, onward)   None       Objective: Vitals:   12/16/18 2024 12/16/18 2314 12/17/18 0320 12/17/18 0728  BP: 126/86 121/80 124/87 122/78  Pulse: 73 86 80 70  Resp: 16 16 17 14   Temp: 98 F (36.7 C) 98.5 F (36.9 C) 98.7 F (37.1 C) 98.7 F (37.1 C)  TempSrc: Oral Oral Oral Oral  SpO2: 100% 98% 97%  100%  Weight:      Height:        Intake/Output Summary (Last 24 hours) at 12/17/2018 1105 Last data filed at 12/17/2018 0321 Gross per 24 hour  Intake -  Output 850 ml  Net -850 ml   Filed Weights   12/09/18 0342  Weight: 76.6 kg   Weight change:   Body mass index is 25.68 kg/m.  Intake/Output from previous day: 06/23 0701 - 06/24 0700 In: 150 [P.O.:150] Out: 850 [Urine:850] Intake/Output this shift: No intake/output data recorded.   Examination:  General exam: Appears calm and comfortable, NAD, irritated HEENT:PERRL,Oral mucosa moist, Ear/Nose normal on gross exam Respiratory system: Bilateral equal air entry, normal vesicular breath sounds, no wheezes or crackles  Cardiovascular system: S1 & S2 heard,No JVD, murmurs. Gastrointestinal system: Abdomen is  soft, non tender, non distended, BS +  Nervous System:Alert and oriented, at baseline, right facial droop, right upper extremity in lower extremity weak/flaccid.  Sensation intact. Extremities: No edema, no clubbing, distal peripheral pulses palpable. Skin: No rashes, lesions, no icterus MSK: Normal muscle bulk,tone ,power  Medications:  Scheduled Meds: .  stroke: mapping our early stages of recovery book   Does not apply Once  . ALPRAZolam  0.25 mg Oral TID  . Chlorhexidine Gluconate Cloth  6 each Topical Daily  . heparin injection (subcutaneous)  5,000 Units Subcutaneous Q8H  . pantoprazole  40 mg Oral Daily  . senna-docusate  1 tablet Oral BID  . traMADol  100 mg Oral Q6H   Continuous Infusions: . sodium chloride 250 mL (12/15/18 2300)  . valproate sodium 750 mg (12/17/18 0553)    Data Reviewed: I have personally reviewed following labs and imaging studies  CBC: Recent Labs  Lab 12/11/18 0544 12/12/18 0424 12/13/18 0648 12/14/18 0543 12/17/18 0510  WBC 15.7* 14.2* 12.4* 11.4* 7.3  NEUTROABS  --   --   --   --  3.9  HGB 15.5 16.0 16.7 17.0 17.1*  HCT 43.7 44.9 47.1 47.7 47.6  MCV 88.1 87.0 88.2 87.7 88.8  PLT 259 261 276 287 293   Basic Metabolic Panel: Recent Labs  Lab 12/11/18 0544 12/12/18 0424 12/13/18 0648 12/14/18 0543 12/17/18 0510  NA 137 138 138 136 133*  K 3.5 3.1* 3.8 4.1 3.8  CL 104 102 104 99 93*  CO2 23 24 22 25 28   GLUCOSE 119* 100* 99 99 125*  BUN 8 7 7 11 20   CREATININE 1.00 0.89 0.91 0.92 1.00  CALCIUM 9.4 9.5 9.5 9.8 9.7  MG  --   --  2.1  --  2.3   GFR: Estimated Creatinine Clearance: 105.5 mL/min (by C-G  formula based on SCr of 1 mg/dL). Liver Function Tests: Recent Labs  Lab 12/17/18 0510  AST 51*  ALT 47*  ALKPHOS 95  BILITOT 1.0  PROT 7.6  ALBUMIN 4.0   No results for input(s): LIPASE, AMYLASE in the last 168 hours. No results for input(s): AMMONIA in the last 168 hours. Coagulation Profile: No results for input(s): INR, PROTIME in the last 168 hours. Cardiac Enzymes: No results for input(s): CKTOTAL, CKMB, CKMBINDEX, TROPONINI in the last 168 hours. BNP (last 3 results) No results for input(s): PROBNP in the last 8760 hours. HbA1C: No results for input(s): HGBA1C in the last 72 hours. CBG: Recent Labs  Lab 12/16/18 0132  GLUCAP 106*   Lipid Profile: No results for input(s): CHOL, HDL, LDLCALC, TRIG, CHOLHDL, LDLDIRECT in the last 72 hours. Thyroid Function Tests:  No results for input(s): TSH, T4TOTAL, FREET4, T3FREE, THYROIDAB in the last 72 hours. Anemia Panel: No results for input(s): VITAMINB12, FOLATE, FERRITIN, TIBC, IRON, RETICCTPCT in the last 72 hours. Sepsis Labs: No results for input(s): PROCALCITON, LATICACIDVEN in the last 168 hours.  Recent Results (from the past 240 hour(s))  SARS Coronavirus 2     Status: None   Collection Time: 12/09/18  1:03 AM  Result Value Ref Range Status   SARS Coronavirus 2 NOT DETECTED NOT DETECTED Final    Comment: (NOTE) SARS-CoV-2 target nucleic acids are NOT DETECTED. The SARS-CoV-2 RNA is generally detectable in upper and lower respiratory specimens during the acute phase of infection.  Negative  results do not preclude SARS-CoV-2 infection, do not rule out co-infections with other pathogens, and should not be used as the sole basis for treatment or other patient management decisions.  Negative results must be combined with clinical observations, patient history, and epidemiological information. The expected result is Not Detected. Fact Sheet for Patients:  http://www.biofiredefense.com/wp-content/uploads/2020/03/BIOFIRE-COVID -19-patients.pdf Fact Sheet for Healthcare Providers: http://www.biofiredefense.com/wp-content/uploads/2020/03/BIOFIRE-COVID -19-hcp.pdf This test is not yet approved or cleared by the [place] and  has been authorized for detection and/or diagnosis of SARS-CoV-2 by FDA under an Emergency Use Authorization (EUA).  This EUA will remain in effec t (meaning this test can be used) for the duration of  the COVID-19 declaration under Section 564(b)(1) of the Act, 21 U.S.C. section 360bbb-3(b)(1), unless the authorization is terminated or revoked sooner. Performed at [facility], [place] [address], [place], [place] [address]   MRSA PCR Screening     Status: None   Collection Time: 12/09/18  3:44 AM   Specimen: Nasal Mucosa; Nasopharyngeal  Result Value Ref Range Status   MRSA by PCR NEGATIVE NEGATIVE Final    Comment:        The GeneXpert MRSA Assay (FDA approved for NASAL specimens only), is one component of a comprehensive MRSA colonization surveillance program. It is not intended to diagnose MRSA infection nor to guide or monitor treatment for MRSA infections. Performed at [facility], [place] [address], [place], [place] [address]       Radiology Studies: Mr Jeri Cos Contrast  Result Date: 12/16/2018 CLINICAL DATA:  Intracranial hemorrhage follow up EXAM: MRI HEAD WITH CONTRAST TECHNIQUE: Multiplanar, multiecho pulse sequences of the brain and surrounding structures were obtained with intravenous contrast. CONTRAST:  7.5 mL Gadavist COMPARISON:  Head CT 12/12/2018 Brain MRI without contrast 12/09/2018 FINDINGS: Mildly motion degraded study. There is unchanged intraparenchymal hematoma within the left parietal lobe with intraventricular extension and involvement of the corpus callosum. There is intrinsic T1WI-hyperintensity surrounding the hemorrhage. There is no abnormal contrast enhancement.  Small amount of left convexity subarachnoid blood, unchanged. Unchanged size and configuration of the ventricles. IMPRESSION: No abnormal contrast enhancement. Unchanged left parietal hemorrhage extending into the ventricles, corpus callosum and left convexity subarachnoid space. Electronically Signed   By: Ulyses Jarred M.D.   On: 12/16/2018 20:22      LOS: 8 days   Time spent: More than 50% of that time was spent in counseling and/or coordination of care.  Antonieta Pert, MD [facility]  12/17/2018, 11:05 AM

## 2018-12-17 NOTE — Progress Notes (Signed)
Inpatient Rehabilitation Admissions Coordinator  I met wit patient at bedside. He is more interactive today in discussing his frustrations with his current situation. He states he plans to return to his girlfriend's home at d/c and that I can follow up with her and his mother . I contacted girlfriend, Museum/gallery conservator, and she states the plan will be for patient to return to his Mom's but she has not discussed with him . I have left a voicemail for pt's Mom to contact me to further discuss caregiver support at td/c.  Danne Baxter, RN, MSN Rehab Admissions Coordinator [phone] 12/17/2018 4:43 PM

## 2018-12-17 NOTE — Progress Notes (Signed)
Patient seems to be more depressed with his physical situation stating "It's just too much" and "Can't do anything". Suggested chaplain services to discuss feelings. Patient declined. Offered to assist with breakfast set-up and patient declined. Notified MD. Wendee Copp

## 2018-12-18 ENCOUNTER — Other Ambulatory Visit: Payer: Self-pay

## 2018-12-18 NOTE — Progress Notes (Signed)
Physical Therapy Treatment Patient Details Name: Walter Grimes MRN: [id number] DOB: [birth year] Today's Date: 12/18/2018    History of Present Illness Pt is a 28 y.o. M who presents after a seizure. CT 6/16 showing left parietal hemorrhage with intraventricular extension. Repeat CT 6/18 showing slight increase in left parietal parenchymal hemorrhage. Normal EEG.     PT Comments    Pt continues to be distraught by his current situation. Willing to work with therapy. Pt able to relate that he has more sensation today on his R, he has more finger and hand AROM, and better trunk control. Pt requires modA to come to sitting EoB, min A to stand at [facility] and modAx2 for standing at [facility], for knee blocking and steadying in standing. Performed pre-gait activity for approx 8 minutes before pt with c/o of dizziness and L sided fatigue. Pt noted that he feels like he is getting weaker. PT educated on need for protein to maintain strength he does have and build on strength returning on R side. Pt asked about prior athletics and knowledge of muscle building. Pt agreeable to eating hamburger after session. Pt also able to perform AAROM of U and LE in seating. Pt continues to be an excellent candidate for CIR level Rehab. PT will continue to follow acutely.   Follow Up Recommendations  CIR;Supervision/Assistance - 24 hour     Equipment Recommendations  (TBD by next venue of care)       Precautions / Restrictions Precautions Precautions: Fall Precaution Comments: anxious Restrictions Weight Bearing Restrictions: No    Mobility  Bed Mobility Overal bed mobility: Needs Assistance Bed Mobility: Supine to Sit     Supine to sit: Mod assist     General bed mobility comments: pt admits much more ability to activate his trunk today. Pt able to utilize L LE to move R LE and bedrail to pull himself to EoB, requiring moD A for R UE managment and trunk to upright   Transfers Overall transfer level: Needs  assistance Equipment used: 2 person hand held assist(raised bedrail) Transfers: Sit to/from BJ'sStand;Stand Pivot Transfers Sit to Stand: +2 safety/equipment;Min assist;Mod assist         General transfer comment: minA for power up into Stedy with stronger L side, transferred to recliner so that he could sit>stand from recliner using bed rail in raised position, requires modAx1 for powerup with modAx1 for blocking R knee, able to stand practice weightshifts and moving L LE, difficulty with hyperextension of R knee to achieve.        Modified Rankin (Stroke Patients Only) Modified Rankin (Stroke Patients Only) Pre-Morbid Rankin Score: No symptoms Modified Rankin: Severe disability     Balance Overall balance assessment: Needs assistance Sitting-balance support: Feet supported;Single extremity supported Sitting balance-Leahy Scale: Poor Sitting balance - Comments: Requires varying assist to maintain static sitting balance- anywhere from min A-Max A. Pt with complete LOB x2 to right when letting go of LUE. No trunk activation on right side. Anterior lean, right lateral lean. Poor awareness of body/balance. Postural control: Right lateral lean;Posterior lean Standing balance support: During functional activity Standing balance-Leahy Scale: Poor                              Cognition Arousal/Alertness: Awake/alert Behavior During Therapy: Anxious Overall Cognitive Status: Impaired/Different from baseline Area of Impairment: Awareness;Following commands;Safety/judgement;Problem solving  Current Attention Level: Sustained Memory: Decreased short-term memory Following Commands: Follows one step commands consistently;Follows multi-step commands with increased time Safety/Judgement: Decreased awareness of safety Awareness: Emergent Problem Solving: Decreased initiation;Difficulty sequencing;Requires verbal cues;Requires tactile cues General Comments: R  inattention noted, even with cues to attend to it.       Exercises General Exercises - Upper Extremity Shoulder Flexion: AAROM;Right;5 reps;Seated Shoulder Horizontal ADduction: AAROM;Right;5 reps;Seated Elbow Flexion: AAROM;5 reps;Right;Seated Elbow Extension: AAROM;Right;5 reps;Seated General Exercises - Lower Extremity Long Arc Quad: AAROM;Left;5 reps;Seated    General Comments General comments (skin integrity, edema, etc.): Pt participated in pre-gait activity in standing weightshifting and marching L LE. Pt with c/o of dizziness and muscle fatigue on L side. Discussed need for eating especially protein, as he had not eaten lunch again today. RN to reorder and pt to at least eat the hamburger patty.       Pertinent Vitals/Pain Pain Assessment: Faces Faces Pain Scale: Hurts a little bit Pain Location: head Pain Descriptors / Indicators: Aching Pain Intervention(s): Limited activity within patient's tolerance;Monitored during session;Repositioned           PT Goals (current goals can now be found in the care plan section) Acute Rehab PT Goals Patient Stated Goal: "I want to walk again." PT Goal Formulation: Patient unable to participate in goal setting Time For Goal Achievement: 12/25/18 Potential to Achieve Goals: Good Progress towards PT goals: Progressing toward goals    Frequency    Min 4X/week      PT Plan Current plan remains appropriate    Co-evaluation PT/OT/SLP Co-Evaluation/Treatment: Yes            AM-PAC PT "6 Clicks" Mobility   Outcome Measure  Help needed turning from your back to your side while in a flat bed without using bedrails?: A Lot Help needed moving from lying on your back to sitting on the side of a flat bed without using bedrails?: A Lot Help needed moving to and from a bed to a chair (including a wheelchair)?: A Lot Help needed standing up from a chair using your arms (e.g., wheelchair or bedside chair)?: A Lot Help needed to  walk in hospital room?: Total Help needed climbing 3-5 steps with a railing? : Total 6 Click Score: 10    End of Session Equipment Utilized During Treatment: Gait belt Activity Tolerance: Patient tolerated treatment well Patient left: in bed;with call bell/phone within reach;with bed alarm set Nurse Communication: Mobility status;Patient requests pain meds PT Visit Diagnosis: Other abnormalities of gait and mobility (R26.89);Hemiplegia and hemiparesis;Other symptoms and signs involving the nervous system (R29.898) Hemiplegia - Right/Left: Right Hemiplegia - dominant/non-dominant: Dominant Hemiplegia - caused by: Nontraumatic intracerebral hemorrhage     Time: 1340-1421 PT Time Calculation (min) (ACUTE ONLY): 41 min  Charges:  $Gait Training: 8-22 mins $Therapeutic Exercise: 8-22 mins $Therapeutic Activity: 8-22 mins                     Emojean Gertz B. Migdalia Dk PT, DPT Acute Rehabilitation Services Pager [phone] Office [phone]    [place] 12/18/2018, 3:21 PM

## 2018-12-18 NOTE — Progress Notes (Signed)
Inpatient Rehabilitation Admissions Coordinator  I met with patient at bedside to update on home caregiver support needed and destination. I encouraged him to contact his girlfriend and Mom . I asked him to contact his Mom and ask her to return by calls.  Later today, Mom returned my calls and advised me that patient could come live with her after an inpt rehab admission. She can provide the needed assistance at d/c. I will discuss with Rehab MD for possible admission to inpt rehab tomorrow. I met again with patient to make him aware. I will follow up in the morning.  Danne Baxter, RN, MSN Rehab Admissions Coordinator [phone] 12/18/2018 5:02 PM

## 2018-12-18 NOTE — Progress Notes (Signed)
PROGRESS NOTE    Walter Grimes  PJK:[id number] DOB: 1990/12/27 DOA: 12/08/2018 PCP: Patient, No Pcp Per   Brief Narrative: As per prior attending: 28 y.o.malewho presented to the ED on 12/09/2018 after a seizure. Per RN who spoke with his girlfriend: "pt was picking up girlfriend when he texted her that he was not feeling well, gf went outside and saw pt was on the ground stating he was having a heart attack and vomited at that time. Pt then was helped to the car and was put in the car and still talking when he started to seizure per girlfriend that his whole body was twitching and not responding for 3 minutes per girlfriend then pt woke up and ems was called by a coworker that was at the girlfriends job. When ems arrived pt was still twitching and ems told girlfriend that pt was still seizing but for girlfriend stated that pt did not look like he was seizing any more but just twitching and more alert."   EMS noted him to be postictal after the seizure, which continued to be present on arrival to the ED. He was not given Versed by EMS. Left hand tremor was noted by EMS.   The patient at baseline apparently has no weakness and is fully functional. However, he had a possible seizureabout twoweeks ago; he was seen for this but was not diagnosed with a seizure disorder and not started on an anticonvulsant.  Initial CT of the head showed left parietal frontal ICH.  Patient was admitted under neurology in ICU service with a diagnosis of intracranial hemorrhage as well as seizure.  Patient was transferred to medical floor on 12/15/2018 under hospitalist service as primary team  Patient is seen by PT OT,CIR has been recommended.  Subjective: Alert awake, not in acute distress.  Denies any headache.  No seizure recurrence.  No acute events overnight. 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 Seizure 2 weeks ago: Initially on Keppra but due to increased anxiety switched to  Depakote, valproic acid level 57.Seen by neurology and following. Continue tramadol/Fioricet for headache control.  Continue valproate.  Continue PT OT, awaiting for inpatient rehab.  Rehab is discussing with family about discharge plan post rehab to determine eligibility.  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  CT head 5.2 x 2.2 left parietal ICH IVH.  MRI brain is stable with Medial parietal ICH extending into the corpus callosum, stable l IVH.  CT repeat 6/19 stable hematoma and no Hydro.MRI brain repeated with contrast 6/23, unchanged and no obvious source of bleeding.Stroke work-up with LDL 93,Hb A1c 5.3. 2D echo EF 60 to 65% negative,HIV/negative RPR.  Cerebral edema: Resolved  Central apnea, likely secondary to IVH, did not tolerate BiPAP.  ABG normal.    Tobacco abuse/ETOH use: Cessation advised  Suspected depression/anxious: In the setting of new medical condition. on xanax. can consider Celexa   Episode of hematemesis 6/16, no recurrence, ? stress ulcer.  Continue PPI twice daily.  Hemoglobin stable.  DVT prophylaxis: Heparin SQ Code Status: FULL Family Communication: Plan of care discussed with the patient . Waiting for inpatient rehab.  Discussed with Dr. Leonie Man from neurology.  Disposition Plan: awaiting on CIR  Consultants:  Neurology PMR  Procedures:  Antimicrobials: Anti-infectives (From admission, onward)   None       Objective: Vitals:   12/17/18 1951 12/17/18 2313 12/18/18 0413 12/18/18 0900  BP: 131/80 120/88 111/77 119/80  Pulse:  92 74 80 77  Resp: 18 18 18 18   Temp: 98.3 F (36.8 C) 98.3 F (36.8 C) 97.6 F (36.4 C) 97.6 F (36.4 C)  TempSrc: Oral Oral Oral Axillary  SpO2: 100% 100% 100% 100%  Weight:      Height:        Intake/Output Summary (Last 24 hours) at 12/18/2018 1112 Last data filed at 12/18/2018 0900 Gross per 24 hour  Intake 220 ml  Output 1150 ml  Net -930 ml   Filed Weights   12/09/18 0342  Weight: 76.6 kg    Weight change:   Body mass index is 25.68 kg/m.  Intake/Output from previous day: 06/24 0701 - 06/25 0700 In: 100 [P.O.:100] Out: 1150 [Urine:1150] Intake/Output this shift: Total I/O In: 120 [P.O.:120] Out: -   Examination:  General exam: Alert awake, oriented, not in acute distress, comfortable.  Does not  be interested in talking HEENT:PERRL,Oral mucosa moist, Ear/Nose normal on gross exam Respiratory system: Bilateral equal air entry, normal vesicular breath sounds, no wheezes or crackles  Cardiovascular system: S1 & S2 heard,No JVD, murmurs. Gastrointestinal system: Abdomen is  soft, non tender, non distended, BS +  Nervous System:Alert and oriented, at baseline, right facial droop, right upper extremity and lower extremity weak/flaccid.  Able to move his left side.  Sensation intact. Extremities: No edema, no clubbing, distal peripheral pulses palpable. Skin: No rashes, lesions, no icterus MSK: Normal muscle bulk,tone ,power  Medications:  Scheduled Meds: .  stroke: mapping our early stages of recovery book   Does not apply Once  . ALPRAZolam  0.25 mg Oral TID  . Chlorhexidine Gluconate Cloth  6 each Topical Daily  . heparin injection (subcutaneous)  5,000 Units Subcutaneous Q8H  . pantoprazole  40 mg Oral Daily  . senna-docusate  1 tablet Oral BID  . traMADol  100 mg Oral Q6H   Continuous Infusions: . sodium chloride 250 mL (12/15/18 2300)  . valproate sodium 750 mg (12/18/18 0557)    Data Reviewed: I have personally reviewed following labs and imaging studies  CBC: Recent Labs  Lab 12/12/18 0424 12/13/18 0648 12/14/18 0543 12/17/18 0510  WBC 14.2* 12.4* 11.4* 7.3  NEUTROABS  --   --   --  3.9  HGB 16.0 16.7 17.0 17.1*  HCT 44.9 47.1 47.7 47.6  MCV 87.0 88.2 87.7 88.8  PLT 261 276 287 293   Basic Metabolic Panel: Recent Labs  Lab 12/12/18 0424 12/13/18 0648 12/14/18 0543 12/17/18 0510  NA 138 138 136 133*  K 3.1* 3.8 4.1 3.8  CL 102 104 99  93*  CO2 24 22 25 28   GLUCOSE 100* 99 99 125*  BUN 7 7 11 20   CREATININE 0.89 0.91 0.92 1.00  CALCIUM 9.5 9.5 9.8 9.7  MG  --  2.1  --  2.3   GFR: Estimated Creatinine Clearance: 105.5 mL/min (by C-G formula based on SCr of 1 mg/dL). Liver Function Tests: Recent Labs  Lab 12/17/18 0510  AST 51*  ALT 47*  ALKPHOS 95  BILITOT 1.0  PROT 7.6  ALBUMIN 4.0   No results for input(s): LIPASE, AMYLASE in the last 168 hours. No results for input(s): AMMONIA in the last 168 hours. Coagulation Profile: No results for input(s): INR, PROTIME in the last 168 hours. Cardiac Enzymes: No results for input(s): CKTOTAL, CKMB, CKMBINDEX, TROPONINI in the last 168 hours. BNP (last 3 results) No results for input(s): PROBNP in the last 8760 hours. HbA1C: No results for input(s):  HGBA1C in the last 72 hours. CBG: Recent Labs  Lab 12/16/18 0132  GLUCAP 106*   Lipid Profile: No results for input(s): CHOL, HDL, LDLCALC, TRIG, CHOLHDL, LDLDIRECT in the last 72 hours. Thyroid Function Tests: No results for input(s): TSH, T4TOTAL, FREET4, T3FREE, THYROIDAB in the last 72 hours. Anemia Panel: No results for input(s): VITAMINB12, FOLATE, FERRITIN, TIBC, IRON, RETICCTPCT in the last 72 hours. Sepsis Labs: No results for input(s): PROCALCITON, LATICACIDVEN in the last 168 hours.  Recent Results (from the past 240 hour(s))  SARS Coronavirus 2     Status: None   Collection Time: 12/09/18  1:03 AM  Result Value Ref Range Status   SARS Coronavirus 2 NOT DETECTED NOT DETECTED Final    Comment: (NOTE) SARS-CoV-2 target nucleic acids are NOT DETECTED. The SARS-CoV-2 RNA is generally detectable in upper and lower respiratory specimens during the acute phase of infection.  Negative  results do not preclude SARS-CoV-2 infection, do not rule out co-infections with other pathogens, and should not be used as the sole basis for treatment or other patient management decisions.  Negative results must be  combined with clinical observations, patient history, and epidemiological information. The expected result is Not Detected. Fact Sheet for Patients: http://www.biofiredefense.com/wp-content/uploads/2020/03/BIOFIRE-COVID -19-patients.pdf Fact Sheet for Healthcare Providers: http://www.biofiredefense.com/wp-content/uploads/2020/03/BIOFIRE-COVID -19-hcp.pdf This test is not yet approved or cleared by the [place] FDA and  has been authorized for detection and/or diagnosis of SARS-CoV-2 by FDA under an Emergency Use Authorization (EUA).  This EUA will remain in effec t (meaning this test can be used) for the duration of  the COVID-19 declaration under Section 564(b)(1) of the Act, 21 U.S.C. section 360bbb-3(b)(1), unless the authorization is terminated or revoked sooner. Performed at [facility], [address] [place], [place] [address]   MRSA PCR Screening     Status: None   Collection Time: 12/09/18  3:44 AM   Specimen: Nasal Mucosa; Nasopharyngeal  Result Value Ref Range Status   MRSA by PCR NEGATIVE NEGATIVE Final    Comment:        The GeneXpert MRSA Assay (FDA approved for NASAL specimens only), is one component of a comprehensive MRSA colonization surveillance program. It is not intended to diagnose MRSA infection nor to guide or monitor treatment for MRSA infections. Performed at [facility], [address] [place], [place] [address]       Radiology Studies: Mr Laqueta JeanBrain W Contrast  Result Date: 12/16/2018 CLINICAL DATA:  Intracranial hemorrhage follow up EXAM: MRI HEAD WITH CONTRAST TECHNIQUE: Multiplanar, multiecho pulse sequences of the brain and surrounding structures were obtained with intravenous contrast. CONTRAST:  7.5 mL Gadavist COMPARISON:  Head CT 12/12/2018 Brain MRI without contrast 12/09/2018 FINDINGS: Mildly motion degraded study. There is unchanged intraparenchymal hematoma within the left parietal lobe with intraventricular extension  and involvement of the corpus callosum. There is intrinsic T1WI-hyperintensity surrounding the hemorrhage. There is no abnormal contrast enhancement. Small amount of left convexity subarachnoid blood, unchanged. Unchanged size and configuration of the ventricles. IMPRESSION: No abnormal contrast enhancement. Unchanged left parietal hemorrhage extending into the ventricles, corpus callosum and left convexity subarachnoid space. Electronically Signed   By: Deatra RobinsonKevin  Herman M.D.   On: 12/16/2018 20:22      LOS: 9 days   Time spent: More than 50% of that time was spent in counseling and/or coordination of care.  Lanae Boastamesh Tallen Schnorr, MD [facility]  12/18/2018, 11:12 AM

## 2018-12-18 NOTE — Progress Notes (Addendum)
STROKE TEAM PROGRESS NOTE   INTERVAL HISTORY Patient is neurologically stable with no more seizures.  He continues to complain of headaches.   Rehab decision still awaited. He is now able to move his right hand fingers a bit but not his leg  Vitals:   12/17/18 2313 12/18/18 0413 12/18/18 0900 12/18/18 1100  BP: 120/88 111/77 119/80 125/82  Pulse: 74 80 77 65  Resp: 18 18 18 18   Temp: 98.3 F (36.8 C) 97.6 F (36.4 C) 97.6 F (36.4 C) 98.3 F (36.8 C)  TempSrc: Oral Oral Axillary Oral  SpO2: 100% 100% 100% 100%  Weight:      Height:        CBC:  Recent Labs  Lab 12/14/18 0543 12/17/18 0510  WBC 11.4* 7.3  NEUTROABS  --  3.9  HGB 17.0 17.1*  HCT 47.7 47.6  MCV 87.7 88.8  PLT 287 293    Basic Metabolic Panel:  Recent Labs  Lab 12/13/18 0648 12/14/18 0543 12/17/18 0510  NA 138 136 133*  K 3.8 4.1 3.8  CL 104 99 93*  CO2 22 25 28   GLUCOSE 99 99 125*  BUN 7 11 20   CREATININE 0.91 0.92 1.00  CALCIUM 9.5 9.8 9.7  MG 2.1  --  2.3   IMAGING past 24h No results found.   PHYSICAL EXAM  General - frail young african [place] male not in distress but quite anxiousl.  Ophthalmologic - fundi not visualized due to noncooperation.  Cardiovascular - Regular rate and rhythm.  Neuro -awake alert , oriented to place, age, name but not to time or situation. Language not cooperative on testing, but able to form short sentences, mild dysarthria.  . Right facial droop, PERRL, eye midline, able to track bilaterally. Right hemiplegia  with trace right hand movements only with flaccidity.. LUE and LLE spontaneous movement. Right UE and LE hypotonia with positive babinski. Sensation, coordination not cooperative and gait not tested.   ASSESSMENT/PLAN [name] is a 28 y.o. male with no significant past medical history presenting post seizure with L parietal frontal ICH seen on CT.   New onset Seizure - likely secondary to ICH  ?? Possible seizure 2 weeks ago  Keppra  2000 mg load followed by 750 bid on admission but increased anxiety may be side effect hence transitioned to [place]  EEG normal  Recurrent "spells" preceded by agitation, 5-10 min w/o jerking  LTM EEG 12/12/18 left focal slowing with few electrographic seizures6/19/20 am episode of right gaze and unresponsiveness  Loaded again with keppra and keppra increase to 1000mg  bid  Put on LTM EEG again 12/12/18 -> left anterior focal slowing with some intermixed rhythmic activity. Some brief focal electrographic seizures were observed without clinical signs.  12/14/18 -> left anterior focal slowing with some intermixed rhythmic activity. No further seizures were seen. Focal rhythmic slowing has improved overnight with normalization of background during waking periodic.   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  Stable L IVH.  Trace SAH.  Stable 4 mm L to R shift.  MRA Unremarkable   Repeat CT head slight increase L IPH from 5.4 to 5.8 cm in AP diameter.  Repeat CT 6/17 stable hematoma and slight decrease of IVH but slight increase of  midline shift  CT repeat 12/12/18 stable hematoma and no hydro  MRI with contrast no enhancing lesions or tumors   2D Echo EF 60-65%  LDL 93  HgbA1c 5.3  HIV neg, RPR neg  Heparin subq for VTE prophylaxis  No antithrombotic prior to admission, now on No antithrombotic given hemorrhage  Therapy recommendations:  CIR  Disposition:  pending   Cerebral Edema, resolved  Off 3% given small size of ICH  Repeat CT 6/17 stable hematoma and slight decrease of IVH but slight increase of midline shift  CT repeat 12/12/18 stable hematoma and no hydro  Central sleep APNEA, improving  Felt to be secondary to IVH  ABG normal  Did not tolerate bipap  ?? Stress  ulcer  Bloody vomitus on 12/09/18, now resolved  Concerning for stress ulcer  On protonix IV bid -> PO bid  HGB stable 17.0  Leukocytosis  WBC 11.4  No fever  UA neg  CXR neg   CBC monitoring  Tobacco abuse  Current smoker  Smoking cessation counseling provided  Pt is willing to quit  Other Stroke Risk Factors  ETOH use, advised to drink no more than 2 drink(s) a day  Marijuana use w/ UDS positive for THC - cessation education provided  Hypokalemia, resolved 4.1  Continue Depacon   for seizures.  Continue tramadol and Fioricet for headaches Transfer to reahab when bed available. Continue ongoing therapies.Stroke team will sign off. Call for questions.D/w Dr [facility] day # [place], [place] Pager: [phone] 12/18/2018 1:08 PM      To contact Stroke Continuity provider, please refer to [email or web address] After hours, contact General Neurology

## 2018-12-18 NOTE — Plan of Care (Signed)
  Problem: Education: Goal: Knowledge of disease or condition will improve Outcome: Progressing Goal: Knowledge of secondary prevention will improve Outcome: Progressing Goal: Knowledge of patient specific risk factors addressed and post discharge goals established will improve Outcome: Progressing Goal: Individualized Educational Video(s) Outcome: Progressing   

## 2018-12-19 ENCOUNTER — Encounter (HOSPITAL_COMMUNITY): Payer: Self-pay

## 2018-12-19 ENCOUNTER — Inpatient Hospital Stay (HOSPITAL_COMMUNITY)
Admission: RE | Admit: 2018-12-19 | Discharge: 2019-01-07 | DRG: 057 | Disposition: A | Discharge status: Home Health | Payer: Medicaid Other | Source: Intra-hospital | Attending: Physical Medicine & Rehabilitation | Admitting: Physical Medicine & Rehabilitation

## 2018-12-19 DIAGNOSIS — R569 Unspecified convulsions: Secondary | ICD-10-CM

## 2018-12-19 DIAGNOSIS — R740 Nonspecific elevation of levels of transaminase and lactic acid dehydrogenase [LDH]: Secondary | ICD-10-CM | POA: Diagnosis present

## 2018-12-19 DIAGNOSIS — Z8249 Family history of ischemic heart disease and other diseases of the circulatory system: Secondary | ICD-10-CM

## 2018-12-19 DIAGNOSIS — Z5329 Procedure and treatment not carried out because of patient's decision for other reasons: Secondary | ICD-10-CM | POA: Diagnosis present

## 2018-12-19 DIAGNOSIS — G479 Sleep disorder, unspecified: Secondary | ICD-10-CM | POA: Diagnosis present

## 2018-12-19 DIAGNOSIS — Z7141 Alcohol abuse counseling and surveillance of alcoholic: Secondary | ICD-10-CM | POA: Diagnosis not present

## 2018-12-19 DIAGNOSIS — I69319 Unspecified symptoms and signs involving cognitive functions following cerebral infarction: Secondary | ICD-10-CM

## 2018-12-19 DIAGNOSIS — I611 Nontraumatic intracerebral hemorrhage in hemisphere, cortical: Secondary | ICD-10-CM

## 2018-12-19 DIAGNOSIS — F419 Anxiety disorder, unspecified: Secondary | ICD-10-CM | POA: Diagnosis present

## 2018-12-19 DIAGNOSIS — F411 Generalized anxiety disorder: Secondary | ICD-10-CM | POA: Diagnosis present

## 2018-12-19 DIAGNOSIS — Z7151 Drug abuse counseling and surveillance of drug abuser: Secondary | ICD-10-CM

## 2018-12-19 DIAGNOSIS — F329 Major depressive disorder, single episode, unspecified: Secondary | ICD-10-CM | POA: Diagnosis present

## 2018-12-19 DIAGNOSIS — F1290 Cannabis use, unspecified, uncomplicated: Secondary | ICD-10-CM | POA: Diagnosis present

## 2018-12-19 DIAGNOSIS — R0989 Other specified symptoms and signs involving the circulatory and respiratory systems: Secondary | ICD-10-CM | POA: Diagnosis present

## 2018-12-19 DIAGNOSIS — Z716 Tobacco abuse counseling: Secondary | ICD-10-CM | POA: Diagnosis not present

## 2018-12-19 DIAGNOSIS — I69119 Unspecified symptoms and signs involving cognitive functions following nontraumatic intracerebral hemorrhage: Principal | ICD-10-CM

## 2018-12-19 DIAGNOSIS — I612 Nontraumatic intracerebral hemorrhage in hemisphere, unspecified: Secondary | ICD-10-CM

## 2018-12-19 DIAGNOSIS — G40909 Epilepsy, unspecified, not intractable, without status epilepticus: Secondary | ICD-10-CM | POA: Diagnosis present

## 2018-12-19 DIAGNOSIS — I619 Nontraumatic intracerebral hemorrhage, unspecified: Secondary | ICD-10-CM

## 2018-12-19 DIAGNOSIS — F17210 Nicotine dependence, cigarettes, uncomplicated: Secondary | ICD-10-CM | POA: Diagnosis present

## 2018-12-19 DIAGNOSIS — Z720 Tobacco use: Secondary | ICD-10-CM

## 2018-12-19 DIAGNOSIS — R7401 Elevation of levels of liver transaminase levels: Secondary | ICD-10-CM

## 2018-12-19 LAB — CBC
HCT: 48.7 % (ref 39.0–52.0)
Hemoglobin: 16.9 g/dL (ref 13.0–17.0)
MCH: 31 pg (ref 26.0–34.0)
MCHC: 34.7 g/dL (ref 30.0–36.0)
MCV: 89.2 fL (ref 80.0–100.0)
Platelets: 308 10*3/uL (ref 150–400)
RBC: 5.46 MIL/uL (ref 4.22–5.81)
RDW: 11.9 % (ref 11.5–15.5)
WBC: 6.1 10*3/uL (ref 4.0–10.5)
nRBC: 0 % (ref 0.0–0.2)

## 2018-12-19 LAB — CREATININE, SERUM
Creatinine, Ser: 1.05 mg/dL (ref 0.61–1.24)
GFR calc Af Amer: >60 mL/min (ref >60)
GFR calc non Af Amer: >60 mL/min (ref >60)

## 2018-12-19 MED ORDER — ACETAMINOPHEN 325 MG PO TABS
650.0000 mg | ORAL_TABLET | ORAL | Status: DC | PRN
  Administered 2018-12-20 – 2018-12-21 (×4): 650 mg via ORAL
  Filled 2018-12-19 (×6): qty 2

## 2018-12-19 MED ORDER — PANTOPRAZOLE SODIUM 40 MG PO TBEC: 40.0000 mg | DELAYED_RELEASE_TABLET | Freq: Every day | ORAL | 0 refills | Status: DC

## 2018-12-19 MED ORDER — ACETAMINOPHEN 650 MG RE SUPP: 650.0000 mg | RECTAL | Status: DC | PRN

## 2018-12-19 MED ORDER — TRAMADOL HCL 50 MG PO TABS
100.0000 mg | ORAL_TABLET | Freq: Four times a day (QID) | ORAL | Status: DC
  Administered 2018-12-19 – 2018-12-20 (×2): 100 mg via ORAL
  Filled 2018-12-19 (×5): qty 2

## 2018-12-19 MED ORDER — VALPROIC ACID 250 MG PO CAPS
750.0000 mg | ORAL_CAPSULE | Freq: Three times a day (TID) | ORAL | Status: DC
  Administered 2018-12-19 – 2019-01-07 (×50): 750 mg via ORAL
  Filled 2018-12-19 (×60): qty 3

## 2018-12-19 MED ORDER — BUTALBITAL-APAP-CAFFEINE 50-325-40 MG PO TABS
1.0000 | ORAL_TABLET | Freq: Three times a day (TID) | ORAL | Status: DC | PRN
  Administered 2018-12-21 – 2018-12-22 (×2): 1 via ORAL
  Filled 2018-12-19 (×2): qty 1

## 2018-12-19 MED ORDER — VALPROIC ACID 250 MG PO CAPS
750.0000 mg | ORAL_CAPSULE | Freq: Three times a day (TID) | ORAL | Status: DC
  Administered 2018-12-19 (×2): 750 mg via ORAL
  Filled 2018-12-19 (×2): qty 3

## 2018-12-19 MED ORDER — PANTOPRAZOLE SODIUM 40 MG PO TBEC
40.0000 mg | DELAYED_RELEASE_TABLET | Freq: Every day | ORAL | Status: DC
  Administered 2018-12-20 – 2019-01-07 (×18): 40 mg via ORAL
  Filled 2018-12-19 (×19): qty 1

## 2018-12-19 MED ORDER — HEPARIN SODIUM (PORCINE) 5000 UNIT/ML IJ SOLN
5000.0000 [IU] | Freq: Three times a day (TID) | INTRAMUSCULAR | Status: DC
  Administered 2018-12-19 – 2019-01-07 (×56): 5000 [IU] via SUBCUTANEOUS
  Filled 2018-12-19 (×56): qty 1

## 2018-12-19 MED ORDER — ACETAMINOPHEN 325 MG PO TABS: 650.0000 mg | ORAL_TABLET | ORAL | 0 refills | Status: DC | PRN

## 2018-12-19 MED ORDER — POLYETHYLENE GLYCOL 3350 17 G PO PACK: 17.0000 g | PACK | Freq: Every day | ORAL | Status: DC | PRN

## 2018-12-19 MED ORDER — ACETAMINOPHEN 160 MG/5ML PO SOLN: 650.0000 mg | ORAL | Status: DC | PRN

## 2018-12-19 MED ORDER — SENNOSIDES-DOCUSATE SODIUM 8.6-50 MG PO TABS
1.0000 | ORAL_TABLET | Freq: Two times a day (BID) | ORAL | Status: DC
  Administered 2018-12-19 – 2019-01-07 (×34): 1 via ORAL
  Filled 2018-12-19 (×38): qty 1

## 2018-12-19 MED ORDER — HEPARIN SODIUM (PORCINE) 5000 UNIT/ML IJ SOLN: 5000.0000 [IU] | Freq: Three times a day (TID) | INTRAMUSCULAR | Status: DC

## 2018-12-19 MED ORDER — VALPROIC ACID 250 MG PO CAPS: 750.0000 mg | ORAL_CAPSULE | Freq: Three times a day (TID) | ORAL | 0 refills | Status: DC

## 2018-12-19 MED ORDER — SORBITOL 70 % SOLN: 30.0000 mL | Freq: Every day | Status: DC | PRN

## 2018-12-19 MED ORDER — BISACODYL 10 MG RE SUPP: 10.0000 mg | Freq: Every day | RECTAL | Status: DC | PRN

## 2018-12-19 MED ORDER — ALPRAZOLAM 0.25 MG PO TABS
0.2500 mg | ORAL_TABLET | Freq: Three times a day (TID) | ORAL | Status: DC
  Administered 2018-12-19 – 2018-12-24 (×7): 0.25 mg via ORAL
  Filled 2018-12-19 (×12): qty 1

## 2018-12-19 NOTE — H&P (Signed)
Physical Medicine and Rehabilitation Admission H&P    Chief Complaint  Patient presents with  . Altered Mental Status  : HPI: Walter Grimes is a 28 year old right-handed male with documented history of alcohol and tobacco use on no prescription medications.  Per chart review patient lives with girlfriend of 1 month and independent prior to admission.  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  Plans to stay with his mother and stepfather on discharge with assistance as needed.  Presented 12/08/2018 with altered mental status unresponsiveness and reported seizure by EMS lasting approximately 3 minutes.  Urine drug screen positive marijuana.  Cranial CT/MRI scan showed a 5.8 x 2.8 cm left parietal hemorrhage with intraventricular extension with 4 mm left to right midline shift.  Normal MRA of the head.  Initial EEG negative for seizure.  Follow-up EEG 12/12/2018 and 12/14/2018 left focal slowing with few electrographic seizures.  Patient loaded with Keppra and later changed to valproate 750 mg every 8 hours..  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  Follow-up MRI 12/16/2018 unchanged left parietal hemorrhage with no enhancing lesions or tumor.  Patient initially maintained on 3% for cerebral edema since resolved and discontinued.  Echocardiogram with ejection fraction of 65%.  His diet has been advanced to regular.  Maintained on Fioricet for bouts of headaches.  Therapy evaluations completed and patient was admitted for a comprehensive rehab program.  Review of Systems  Constitutional: Negative for chills and fever.  HENT: Negative for hearing loss.   Eyes: Positive for blurred vision. Negative for double vision.  Respiratory: Negative for cough and shortness of breath.   Cardiovascular: Negative for chest pain, palpitations and leg swelling.  Gastrointestinal: Positive for constipation. Negative for heartburn, nausea and vomiting.   Genitourinary: Negative for dysuria, flank pain and hematuria.  Skin: Negative for rash.  Neurological: Positive for dizziness, seizures and headaches.  All other systems reviewed and are negative.  History reviewed. No pertinent past medical history. History reviewed. No pertinent surgical history. History reviewed. No pertinent family history. Social History:  reports that he has been smoking cigarettes. He has been smoking about 1.00 pack per day. He has never used smokeless tobacco. He reports current alcohol use. He reports current drug use. Drug: Marijuana. 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  Allergies: No Known Allergies No medications prior to admission.    Drug Regimen Review Drug regimen was reviewed and remains appropriate with no significant issues identified  Home: Home Living Family/patient expects to be discharged to:: Private residence Living Arrangements: Spouse/significant other(girlfriend) Available Help at Discharge: Friend(s)  Lives With: Significant other   Functional History: Prior Function Level of Independence: Independent Comments: Works as Company secretary"sound engineer"  Functional Status:  Mobility: Bed Mobility Overal bed mobility: Needs Assistance Bed Mobility: Supine to Sit Supine to sit: Mod assist General bed mobility comments: pt admits much more ability to activate his trunk today. Pt able to utilize L LE to move R LE and bedrail to pull himself to EoB, requiring moD A for R UE managment and trunk to upright  Transfers Overall transfer level: Needs assistance Equipment used: 2 person hand held assist(raised bedrail) Transfer via Lift Equipment: Stedy Transfers: Sit to/from Stand, Pharmacologisttand Pivot Transfers Sit to Stand: +2 safety/equipment, Min assist, Mod assist Stand pivot transfers: Mod assist, +2 physical assistance, +2 safety/equipment Squat pivot transfers: +2 physical assistance, Max assist General transfer comment: [place]A for power [place] into [place]  with  stronger L side, transferred to recliner so that he could sit>stand from recliner using bed rail in raised position, requires modAx1 for powerup with modAx1 for blocking R knee, able to stand practice weightshifts and moving L LE, difficulty with hyperextension of R knee to achieve.  Ambulation/Gait General Gait Details: Pt participated in pre-gait activity standing with the Stedy. Pt attempted 2x5 TKE in standing on the R with therapist facilitating full extension of R knee. Pt was able to weight shift R and L and march his LLE up x5 as well with therapist blocking R knee.     ADL: ADL Overall ADL's : Needs assistance/impaired Grooming: Wash/dry face, Oral care, Brushing hair, Minimal assistance Grooming Details (indicate cue type and reason): in partial sitting on Stedy.  Pt required assistance for setup of items (to open toothpaste, hold spit cup) Upper Body Bathing: Minimal assistance Lower Body Bathing: +2 for physical assistance, Sit to/from stand Toilet Transfer: +2 for physical assistance, BSC, Moderate assistance Toilet Transfer Details (indicate cue type and reason): Stedy for transfer Functional mobility during ADLs: +2 for physical assistance General ADL Comments: pt tearful and withdrawn intermittently during session, very depressed with current situation.  Pt would benefit from visitor presence for increased motivation  Cognition: Cognition Overall Cognitive Status: Impaired/Different from baseline Arousal/Alertness: Awake/alert Orientation Level: Oriented X4 Attention: Focused, Sustained Focused Attention: Impaired Focused Attention Impairment: Verbal basic Sustained Attention: Impaired Sustained Attention Impairment: Verbal basic Memory: Impaired Memory Impairment: Storage deficit, Decreased recall of new information Awareness: Impaired Awareness Impairment: Intellectual impairment Problem Solving: Impaired Problem Solving Impairment: Verbal complex Cognition  Arousal/Alertness: Awake/alert Behavior During Therapy: Anxious Overall Cognitive Status: Impaired/Different from baseline Area of Impairment: Awareness, Following commands, Safety/judgement, Problem solving Current Attention Level: Sustained Memory: Decreased short-term memory Following Commands: Follows one step commands consistently, Follows multi-step commands with increased time Safety/Judgement: Decreased awareness of safety Awareness: Emergent Problem Solving: Decreased initiation, Difficulty sequencing, Requires verbal cues, Requires tactile cues General Comments: R inattention noted, even with cues to attend to it.   Physical Exam: Blood pressure 115/75, pulse 69, temperature 98.1 F (36.7 C), temperature source Oral, resp. rate 18, height 5\' 8"  (1.727 m), weight 76.6 kg, SpO2 99 %. Physical Exam  Constitutional: He appears well-developed and well-nourished.  HENT:  Head: Normocephalic and atraumatic.  Eyes: Pupils are equal, round, and reactive to light. Conjunctivae are normal.  Neck: Normal range of motion. No tracheal deviation present. No thyromegaly present.  Cardiovascular: Normal rate and regular rhythm.  Respiratory: Effort normal. No respiratory distress. He has no wheezes.  GI: Soft. He exhibits no distension. There is no abdominal tenderness.  Neurological:  Pt sitting in chair. Restless. Some inattention to right Language appears to be intact. Oriented to person and place, provided biographical info. Some delays in processing and sequencing. RUE: trace deltoid, bices, triceps, 2/5 grip. [name] 0-tr HE,KE and 0/5 ADF, PF. LUE and LLE grossly 4-5/5. Decreased sensation [name]>RUE. Able to sense gross touch throughout right side and to a lesser extent pain.   Skin: Skin is warm and dry.  Numerous tattoos.   Psychiatric:  Anxious, distracted    No results found for this or any previous visit (from the past 48 hour(s)). No results found.     Medical Problem List and  Plan: 1.   Cognitive deficits with decreased functional mobility secondary to left parietal frontal ICH with IVH  -admit to inpatient rehab 2.  Antithrombotics: -DVT/anticoagulation: Subcutaneous heparin initiated 12/12/2018  -antiplatelet therapy: N/A 3. Pain  Management: Tramadol 100 mg every 6 hours and Fioricet as needed for headache 4. Mood: Xanax 0.25 mg 3 times daily  -antipsychotic agents: N/A 5. Neuropsych: This patient is not capable of making decisions on his own behalf. 6. Skin/Wound Care: Routine skin checks 7. Fluids/Electrolytes/Nutrition: encourage PO  -labs ordered on admit 8.  Seizure disorder.  Valproate 750 mg every 8 hours.  Latest valproic acid level 57 9.  History of alcohol tobacco and marijuana use.  Provide counseling      Charlton AmorDaniel J Angiulli, PA-C 12/19/2018

## 2018-12-19 NOTE — Discharge Summary (Signed)
Physician Discharge Summary  Estil Daftevin J Freiermuth YNW:[id number] DOB: March 22, 1991 DOA: 12/08/2018  PCP: Patient, No Pcp Per  Admit date: 12/08/2018 Discharge date: 12/19/2018  Admitted From: Home Disposition:  CIR  Recommendations for Outpatient Follow-up and new medication changes:  1. Follow up with Primary Care one week after discharge.    Home Health: no   Equipment/Devices: no    Discharge Condition: stable  CODE STATUS: full  Diet recommendation:   Brief/Interim Summary: 28 year old male who presented with a tonic clonic seizure, when EMS arrived patient was postictal.  On his initial physical examination blood pressure 121/72, heart rate 65, temperature 97.8, respiratory 21, oxygen saturation 100%.  He was somnolent with bilateral upper extremity weakness 2 out of 5.  Sodium 141, potassium 3.2, chloride 106, bicarb 25, glucose 144, BUN 10, creatinine 1.221.8, hemoglobin 14.6, hematocrit 43.5, platelets 255, SARS COVID-19 was negative, urine negative for infection, tox screen positive for tetrahydrocannabinol, negative for alcohol, head CT with 5.2 x 2.2 cm left parietal hemorrhage with intraventricular extension.  His chest radiograph was negative for infiltrates.  EKG 58 bpm, normal axis, normal intervals, sinus rhythm with normal conduction, J-point elevation V2 to V3, no ST segment or T wave changes.  Patient was admitted with a working diagnosis of new onset seizures, in the setting of intracranial hemorrhage.  1.  Generalized tonic-clonic seizures in the setting of intracranial hemorrhage.  Patient received antiepileptic agents with Keppra, but because increased anxiety it was changed to Depakote.  Seizures was closely monitored with electroencephalography and Valproate dose was adjusted accordingly by neurology.  Currently on 750 mg 3 times daily.  His last electroencephalography June 21 with no further seizure activity.  2.  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  Further work-up with brain MRI showed stable left medial parietal intracerebral hemorrhage extending into the corpus callosum.  Stable left intraventricular hemorrhage.  Trace subarachnoid hemorrhage.  Stable 4 mm left-to-right shift.  Patient had surveillance head CTs, last in June 19 showed stable hematoma with no hydro.  Patient was seen by physical therapy with recommendations for inpatient rehab.  3.  Tobacco and alcohol abuse.  Cessation counseling.  4.  Depression/anxiety.  Patient received alprazolam.  5.  Suspected peptic ulcer disease.  Patient was placed on pantoprazole twice daily.  Discharge Diagnoses:  Active Problems:   ICH (intracerebral hemorrhage) (HCC)    Discharge Instructions   Allergies as of 12/19/2018   No Known Allergies     Medication List    TAKE these medications   acetaminophen 325 MG tablet Commonly known as: TYLENOL Take 2 tablets (650 mg total) by mouth every 4 (four) hours as needed for mild pain (or temp > 37.5 C (99.5 F)).   pantoprazole 40 MG tablet Commonly known as: PROTONIX Take 1 tablet (40 mg total) by mouth daily for 30 days. Start taking on: December 20, 2018   valproic acid 250 MG capsule Commonly known as: DEPAKENE Take 3 capsules (750 mg total) by mouth 3 (three) times daily for 30 days.       No Known Allergies  Consultations:  Neurology    Procedures/Studies: Ct Head Wo Contrast  Result Date: 12/12/2018 CLINICAL DATA:  Intracranial hemorrhage, known, follow-up EXAM: CT HEAD WITHOUT CONTRAST TECHNIQUE: Contiguous axial images were obtained from the base of the skull through the vertex without intravenous contrast. COMPARISON:  CT head without contrast 12/10/2018 FINDINGS: Brain: Left posterior frontal and parietal hemorrhage is again noted. There is  some contraction of the clot with surrounding edema, as expected. Hemorrhage extends across the posterior corpus callosum. No new  hemorrhage is present. Intraventricular hemorrhage is evident bilaterally. There is no hydrocephalus. No new hemorrhage is present. No significant extra-axial fluid collection is present. No new hemorrhage is present. No acute infarct is present. The ventricles are of normal size. Vascular: No hyperdense vessel or unexpected calcification. Skull: Calvarium is intact. No focal lytic or blastic lesions are present. Sinuses/Orbits: A polyp or mucous retention cyst in the left maxillary sinus is stable. The paranasal sinuses and mastoid air cells are otherwise clear. IMPRESSION: 1. Continued evolution of posterior left frontal and parietal hematoma without significant expansion. There may be some contraction of the clot with developing edema. 2. Stable extension of hemorrhage into the corpus callosum. 3. Intraventricular hemorrhage without significant hydrocephalus. Electronically Signed   By: Marin Roberts M.D.   On: 12/12/2018 07:31   Ct Head Wo Contrast  Result Date: 12/11/2018 CLINICAL DATA:  Intracranial hemorrhage, follow-up EXAM: CT HEAD WITHOUT CONTRAST TECHNIQUE: Contiguous axial images were obtained from the base of the skull through the vertex without intravenous contrast. COMPARISON:  12/09/2018 FINDINGS: Brain: Left parietal hemorrhage is again noted. Surrounding vasogenic edema. No real change in the size of the bleed with evolutionary change. Extension into the lateral ventricles. Decreasing blood in the left lateral ventricle posterior horn. No hydrocephalus. Mild increase in left-to-right midline shift, now 6 mm compared to 4 mm previously. No new hemorrhage. Vascular: No hyperdense vessel or unexpected calcification. Skull: No acute calvarial abnormality. Sinuses/Orbits: No acute finding Other: None IMPRESSION: Stable size of the left parietal intracerebral hemorrhage with early evolutionary changes. Intraventricular hemorrhage appears slightly decreased with decreasing blood in the  posterior horn of the left lateral ventricle. Slight increased left-to-right midline shift, now 6 mm. Electronically Signed   By: Charlett Nose M.D.   On: 12/11/2018 02:54   Ct Head Wo Contrast  Result Date: 12/09/2018 CLINICAL DATA:  Intracranial hemorrhage. Change in mental status. Patient has become less responsive. EXAM: CT HEAD WITHOUT CONTRAST TECHNIQUE: Contiguous axial images were obtained from the base of the skull through the vertex without intravenous contrast. COMPARISON:  MRI brain 12/09/2018 at 3:15 a.m. FINDINGS: Brain: Previously noted left parietal parenchymal hemorrhage demonstrates slight increase in size when measured in AP diameter on the sagittal reformatted images. Maximal dimension is now 5.8 cm, compared with 5.4 cm on the prior study. Midline shift is stable at 4 mm. Hemorrhage within the corpus callosum and ventricles is stable. There is no hydrocephalus. No new areas of hemorrhage are present. Vascular: No hyperdense vessel or unexpected calcification. Skull: Calvarium is intact. No focal lytic or blastic lesions are present. No significant extracranial soft tissue injury is present. Sinuses/Orbits: A polyp or mucous retention cyst is again noted posteriorly in the left maxillary sinus. The paranasal sinuses and mastoid air cells are otherwise clear. Globes demonstrate dysconjugate gaze. This may be within normal limits. Globes and orbits are otherwise within normal limits. Other: IMPRESSION: 1. Slight increase in left parietal parenchymal hemorrhage from 5.4 to 5.8 cm in AP diameter as measured on the sagittal reformatted images. 2. Hemorrhage is otherwise stable including involvement of the corpus callosum and extension into the ventricles. 3. No hydrocephalus. 4. Stable mass effect with 4 mm midline shift. 5. No new hemorrhage. The above was relayed via text pager to Dr. Caryl Pina on 12/09/2018 at 04:47 . Electronically Signed   By: Virl Son.D.  On: 12/09/2018 04:47    Ct Head Wo Contrast  Result Date: 12/09/2018 CLINICAL DATA:  Altered mental status, possible seizure. EXAM: CT HEAD WITHOUT CONTRAST TECHNIQUE: Contiguous axial images were obtained from the base of the skull through the vertex without intravenous contrast. COMPARISON:  05/05/2015 FINDINGS: Brain: There is a left parietal hemorrhage measuring 5.8 x 2.2 cm which extends to the lateral ventricle with intraventricular hemorrhage. Blood noted within the lateral ventricles, 3rd ventricle and 4th ventricle. No hydrocephalus. No midline shift. Vascular: No hyperdense vessel or unexpected calcification. Skull: No acute calvarial abnormality. Sinuses/Orbits: Visualized paranasal sinuses and mastoids clear. Orbital soft tissues unremarkable. Other: None IMPRESSION: 5.8 x 2.2 cm left parietal hemorrhage with intraventricular extension. Critical Value/emergent results were called by telephone at the time of interpretation on 12/09/2018 at 12:22 am to Dr. Senaida OresPaulina, who verbally acknowledged these results. Electronically Signed   By: Charlett NoseKevin  Dover M.D.   On: 12/09/2018 00:27   Mr Shirlee LatchMra Head ZOWo Contrast  Result Date: 12/09/2018 CLINICAL DATA:  28 y/o  M; intracranial hemorrhage for follow-up. EXAM: MRI HEAD WITHOUT CONTRAST MRA HEAD WITHOUT CONTRAST TECHNIQUE: MRI of the brain with axial DWI, sagittal T1, axial T2 blade, axial FLAIR blade sequences were acquired. MRA time-of-flight was acquired in the axial plane with multiplanar MIP reconstruction. COMPARISON:  12/09/2018 CT head FINDINGS: MRI HEAD FINDINGS Brain: Stable T1 isointense and T2 hyperintense acute hemorrhage centered within the left medial parietal lobe extending into corpus callosum measuring 58 x 21 mm (AP by ML series 15, image 20). Stable volume of intraventricular hemorrhage throughout the ventricular system, greatest in left lateral ventricle. Associated mass effect with 4 mm of left-to-right midline shift. Mildly increased T2 FLAIR signal within the  quadrigeminal plate cistern and diffuse sulci predominantly over left posterior convexity his compatible subarachnoid hemorrhage, likely due to redistribution. No downward herniation at this time. Vascular: Normal flow voids. No vascular nidus or clustered flow voids identified. Skull and upper cervical spine: Normal marrow signal. Sinuses/Orbits: Left maxillary sinus mucous retention cyst. Additional included paranasal sinuses and mastoid air cells demonstrate normal signal. Orbits are normal. Other: None. MRA HEAD FINDINGS Internal carotid arteries:  Patent. Anterior cerebral arteries:  Patent. Middle cerebral arteries: Patent. Anterior communicating artery: Not identified, likely hypoplastic or absent. Posterior communicating arteries:  Patent. Posterior cerebral arteries:  Patent. Basilar artery:  Patent. Vertebral arteries:  Patent. No evidence of high-grade stenosis, large vessel occlusion, or aneurysm. No high flow vascular lesion is identified within the field of view, however, the superior aspect of the hematoma is above the field of view. IMPRESSION: 1. Stable acute hemorrhage within left medial parietal lobe extending into corpus callosum. Stable intraventricular hemorrhage greatest in left lateral ventricle. Trace volume of subarachnoid hemorrhage likely due to redistribution. Stable associated mass effect with 4 mm left-to-right midline shift. No clustered flow voids to indicate vascular nidus identified. 2. Otherwise unremarkable MRI of the brain. 3. Normal MRA of the head. No high flow vascular lesion within the field of view, however, the superior aspect of the hematoma is above the field of view. Electronically Signed   By: Mitzi HansenLance  Furusawa-Stratton M.D.   On: 12/09/2018 03:57   Mr Brain Wo Contrast  Result Date: 12/09/2018 CLINICAL DATA:  28 y/o  M; intracranial hemorrhage for follow-up. EXAM: MRI HEAD WITHOUT CONTRAST MRA HEAD WITHOUT CONTRAST TECHNIQUE: MRI of the brain with axial DWI,  sagittal T1, axial T2 blade, axial FLAIR blade sequences were acquired. MRA time-of-flight was acquired in the axial  plane with multiplanar MIP reconstruction. COMPARISON:  12/09/2018 CT head FINDINGS: MRI HEAD FINDINGS Brain: Stable T1 isointense and T2 hyperintense acute hemorrhage centered within the left medial parietal lobe extending into corpus callosum measuring 58 x 21 mm (AP by ML series 15, image 20). Stable volume of intraventricular hemorrhage throughout the ventricular system, greatest in left lateral ventricle. Associated mass effect with 4 mm of left-to-right midline shift. Mildly increased T2 FLAIR signal within the quadrigeminal plate cistern and diffuse sulci predominantly over left posterior convexity his compatible subarachnoid hemorrhage, likely due to redistribution. No downward herniation at this time. Vascular: Normal flow voids. No vascular nidus or clustered flow voids identified. Skull and upper cervical spine: Normal marrow signal. Sinuses/Orbits: Left maxillary sinus mucous retention cyst. Additional included paranasal sinuses and mastoid air cells demonstrate normal signal. Orbits are normal. Other: None. MRA HEAD FINDINGS Internal carotid arteries:  Patent. Anterior cerebral arteries:  Patent. Middle cerebral arteries: Patent. Anterior communicating artery: Not identified, likely hypoplastic or absent. Posterior communicating arteries:  Patent. Posterior cerebral arteries:  Patent. Basilar artery:  Patent. Vertebral arteries:  Patent. No evidence of high-grade stenosis, large vessel occlusion, or aneurysm. No high flow vascular lesion is identified within the field of view, however, the superior aspect of the hematoma is above the field of view. IMPRESSION: 1. Stable acute hemorrhage within left medial parietal lobe extending into corpus callosum. Stable intraventricular hemorrhage greatest in left lateral ventricle. Trace volume of subarachnoid hemorrhage likely due to redistribution.  Stable associated mass effect with 4 mm left-to-right midline shift. No clustered flow voids to indicate vascular nidus identified. 2. Otherwise unremarkable MRI of the brain. 3. Normal MRA of the head. No high flow vascular lesion within the field of view, however, the superior aspect of the hematoma is above the field of view. Electronically Signed   By: Mitzi HansenLance  Furusawa-Stratton M.D.   On: 12/09/2018 03:57   Mr Brain W Contrast  Result Date: 12/16/2018 CLINICAL DATA:  Intracranial hemorrhage follow up EXAM: MRI HEAD WITH CONTRAST TECHNIQUE: Multiplanar, multiecho pulse sequences of the brain and surrounding structures were obtained with intravenous contrast. CONTRAST:  7.5 mL Gadavist COMPARISON:  Head CT 12/12/2018 Brain MRI without contrast 12/09/2018 FINDINGS: Mildly motion degraded study. There is unchanged intraparenchymal hematoma within the left parietal lobe with intraventricular extension and involvement of the corpus callosum. There is intrinsic T1WI-hyperintensity surrounding the hemorrhage. There is no abnormal contrast enhancement. Small amount of left convexity subarachnoid blood, unchanged. Unchanged size and configuration of the ventricles. IMPRESSION: No abnormal contrast enhancement. Unchanged left parietal hemorrhage extending into the ventricles, corpus callosum and left convexity subarachnoid space. Electronically Signed   By: Deatra RobinsonKevin  Herman M.D.   On: 12/16/2018 20:22   Dg Chest Port 1 View  Result Date: 12/09/2018 CLINICAL DATA:  28 year old male with a history of respiratory failure EXAM: PORTABLE CHEST 1 VIEW COMPARISON:  07/21/2018 FINDINGS: The heart size and mediastinal contours are within normal limits. Both lungs are clear. The visualized skeletal structures are unremarkable. IMPRESSION: Negative for acute cardiopulmonary disease. Electronically Signed   By: Gilmer MorJaime  Wagner D.O.   On: 12/09/2018 13:21      Procedures:   Subjective: Patient continue to have mild headache,  right sided weakness, no nausea or vomiting, no dyspnea or chest pain.   Discharge Exam: Vitals:   12/19/18 0309 12/19/18 0830  BP: 115/75 100/76  Pulse: 69 90  Resp: 18 16  Temp: 98.1 F (36.7 C) 98.1 F (36.7 C)  SpO2: 99% 99%  Vitals:   12/18/18 1933 12/18/18 2306 12/19/18 0309 12/19/18 0830  BP: 112/74 115/74 115/75 100/76  Pulse: 87 80 69 90  Resp: 18 18 18 16   Temp: 98.5 F (36.9 C) 98.8 F (37.1 C) 98.1 F (36.7 C) 98.1 F (36.7 C)  TempSrc: Oral Oral Oral Oral  SpO2: 100% 100% 99% 99%  Weight:      Height:        General: Not in pain or dyspnea  Neurology: Awake and alert, right sided hemiparesis, 3/4 left with preserved strength.  E ENT: no pallor, no icterus, oral mucosa moist Cardiovascular: No JVD. S1-S2 present, rhythmic, no gallops, rubs, or murmurs. No lower extremity edema. Pulmonary: vesicular breath sounds bilaterally, adequate air movement, no wheezing, rhonchi or rales. Gastrointestinal. Abdomen flat, no organomegaly, non tender, no rebound or guarding Skin. No rashes Musculoskeletal: no joint deformities   The results of significant diagnostics from this hospitalization (including imaging, microbiology, ancillary and laboratory) are listed below for reference.     Microbiology: No results found for this or any previous visit (from the past 240 hour(s)).   Labs: BNP (last 3 results) No results for input(s): BNP in the last 8760 hours. Basic Metabolic Panel: Recent Labs  Lab 12/13/18 0648 12/14/18 0543 12/17/18 0510  NA 138 136 133*  K 3.8 4.1 3.8  CL 104 99 93*  CO2 22 25 28   GLUCOSE 99 99 125*  BUN 7 11 20   CREATININE 0.91 0.92 1.00  CALCIUM 9.5 9.8 9.7  MG 2.1  --  2.3   Liver Function Tests: Recent Labs  Lab 12/17/18 0510  AST 51*  ALT 47*  ALKPHOS 95  BILITOT 1.0  PROT 7.6  ALBUMIN 4.0   No results for input(s): LIPASE, AMYLASE in the last 168 hours. No results for input(s): AMMONIA in the last 168  hours. CBC: Recent Labs  Lab 12/13/18 0648 12/14/18 0543 12/17/18 0510  WBC 12.4* 11.4* 7.3  NEUTROABS  --   --  3.9  HGB 16.7 17.0 17.1*  HCT 47.1 47.7 47.6  MCV 88.2 87.7 88.8  PLT 276 287 293   Cardiac Enzymes: No results for input(s): CKTOTAL, CKMB, CKMBINDEX, TROPONINI in the last 168 hours. BNP: Invalid input(s): POCBNP CBG: Recent Labs  Lab 12/16/18 0132  GLUCAP 106*   D-Dimer No results for input(s): DDIMER in the last 72 hours. Hgb A1c No results for input(s): HGBA1C in the last 72 hours. Lipid Profile No results for input(s): CHOL, HDL, LDLCALC, TRIG, CHOLHDL, LDLDIRECT in the last 72 hours. Thyroid function studies No results for input(s): TSH, T4TOTAL, T3FREE, THYROIDAB in the last 72 hours.  Invalid input(s): FREET3 Anemia work up No results for input(s): VITAMINB12, FOLATE, FERRITIN, TIBC, IRON, RETICCTPCT in the last 72 hours. Urinalysis    Component Value Date/Time   COLORURINE YELLOW 12/09/2018 0456   APPEARANCEUR CLEAR 12/09/2018 0456   LABSPEC 1.025 12/09/2018 0456   PHURINE 7.0 12/09/2018 0456   GLUCOSEU 50 (A) 12/09/2018 0456   HGBUR MODERATE (A) 12/09/2018 0456   BILIRUBINUR NEGATIVE 12/09/2018 0456   KETONESUR 5 (A) 12/09/2018 0456   PROTEINUR 30 (A) 12/09/2018 0456   NITRITE NEGATIVE 12/09/2018 0456   LEUKOCYTESUR NEGATIVE 12/09/2018 0456   Sepsis Labs Invalid input(s): PROCALCITONIN,  WBC,  LACTICIDVEN Microbiology No results found for this or any previous visit (from the past 240 hour(s)).   Time coordinating discharge: 45 minutes  SIGNED:   Tawni Millers, MD  [facility] 12/19/2018, 11:20 AM

## 2018-12-19 NOTE — IPOC Note (Signed)
Individualized overall Plan of Care [facility](IPOC) Patient Details Name: Walter Grimes MRN: [id number] DOB: Feb 02, 1991  Admitting Diagnosis: Left ICH  Hospital Problems: Active Problems:   ICH (intracerebral hemorrhage) (HCC)   Seizures (HCC)   Transaminitis   Sleep disturbance   Anxiety state     Functional Problem List: Nursing Bladder, Bowel, Endurance, Medication Management, Pain, Safety  PT Balance, Safety, Sensory, Motor, Endurance, Nutrition, Pain, Perception, Behavior  OT Balance, Pain, Perception, Safety, Cognition, Sensory, Endurance, Motor  SLP Cognition  TR         Basic ADL's: OT Toileting, Dressing, Bathing, Grooming, Eating     Advanced  ADL's: OT       Transfers: PT Bed Mobility, Bed to Chair, Car, State Street CorporationFurniture, Floor  OT Toilet, Research scientist (life sciences)Tub/Shower     Locomotion: PT Ambulation, Psychologist, prison and probation servicesWheelchair Mobility, Stairs     Additional Impairments: OT Fuctional Use of Upper Extremity  SLP Communication, Social Cognition   Social Interaction, Problem Solving, Memory, Attention, Awareness  TR      Anticipated Outcomes Item Anticipated Outcome  Self Feeding modified independent  Swallowing      Basic self-care  supervision to min assist  Toileting  supervision   Bathroom Transfers min assist  Bowel/Bladder  remain continent and maintain regular pattern of elimination  Transfers  min assist transfers  Locomotion  min assist ambulating with LRAD  Communication  Supervision  Cognition  Supervision  Pain  less than 4  Safety/Judgment  Remain free of falls, skin breakdown, and infection   Therapy Plan: PT Intensity: Minimum of 1-2 x/day ,45 to 90 minutes PT Frequency: 5 out of 7 days PT Duration Estimated Length of Stay: 14-16 days OT Intensity: Minimum of 1-2 x/day, 45 to 90 minutes OT Frequency: 5 out of 7 days OT Duration/Estimated Length of Stay: 14-16 days SLP Intensity: Minumum of 1-2 x/day, 30 to 90 minutes SLP Frequency: 3 to 5 out of 7 days SLP  Duration/Estimated Length of Stay: 10 to 12    Team Interventions: Nursing Interventions Patient/Family Education, Pain Management, Bladder Management, Bowel Management, Medication Management, Discharge Planning, Psychosocial Support  PT interventions Ambulation/gait training, Community reintegration, DME/adaptive equipment instruction, Neuromuscular re-education, Psychosocial support, Stair training, UE/LE Strength taining/ROM, Wheelchair propulsion/positioning, UE/LE Coordination activities, Therapeutic Activities, Skin care/wound management, Pain management, Functional electrical stimulation, Discharge planning, Warden/rangerBalance/vestibular training, Cognitive remediation/compensation, Disease management/prevention, Functional mobility training, Patient/family education, Splinting/orthotics, Therapeutic Exercise, Visual/perceptual remediation/compensation  OT Interventions Balance/vestibular training, Discharge planning, Functional electrical stimulation, Pain management, Self Care/advanced ADL retraining, Therapeutic Activities, UE/LE Coordination activities, Visual/perceptual remediation/compensation, Therapeutic Exercise, Patient/family education, Functional mobility training, Disease mangement/prevention, Cognitive remediation/compensation, FirefighterCommunity reintegration, Fish farm managerDME/adaptive equipment instruction, Neuromuscular re-education, Psychosocial support, Splinting/orthotics, UE/LE Strength taining/ROM, Wheelchair propulsion/positioning  SLP Interventions Patient/family education, Medication managment, Therapeutic Activities, Speech/Language facilitation  TR Interventions    SW/CM Interventions Discharge Planning, Psychosocial Support, Patient/Family Education   Barriers to Discharge MD  Medical stability, Medication compliance and Behavior  Nursing      PT Decreased caregiver support, Inaccessible home environment, Home environment access/layout    OT      SLP Other (comments) history drug use  SW        Team Discharge Planning: Destination: PT-Home ,OT- Home , SLP-Home Projected Follow-up: PT-Home health PT, 24 hour supervision/assistance, OT-  Home health OT, 24 hour supervision/assistance, SLP-Home Health SLP, Outpatient SLP, 24 hour supervision/assistance Projected Equipment Needs: PT-To be determined, OT- 3 in 1 bedside comode, Tub/shower bench, SLP-None recommended by SLP Equipment Details: PT- , OT-  Patient/family involved  in discharge planning: PT- Patient,  OT-Patient, SLP-Patient  MD ELOS: 12-15 days. Medical Rehab Prognosis:  Good Assessment: 28 year old right-handed male with documented history of alcohol and tobacco use on no prescription medications. Presented 12/08/2018 with altered mental status unresponsiveness and reported seizure by EMS lasting approximately 3 minutes. Urine drug screen positive marijuana. Cranial CT/MRI scan showed a 5.8 x 2.8 cm left parietal hemorrhage with intraventricular extension with 4 mm left to right midline shift. Normal MRA of the head. Initial EEG negative for seizure. Follow-up EEG 12/12/2018 and 12/14/2018 left focal slowing with few electrographic seizures. Patient loaded with Keppra and later changed to valproate 750 mg every 8 hours.. 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 Follow-up MRI 12/16/2018 unchanged left parietal hemorrhage with no enhancing lesions or tumor. Patient initially maintained on 3% for cerebral edema since resolved and discontinued. Echocardiogram with ejection fraction of 65%. His diet has been advanced to regular. Maintained on Fioricet for bouts of headaches.  Patient with resulting functional deficits with mobility, transfers, self-care, cognition.  Will set goals for supervision/min A with PT/OT and supervision with SLP.     Due to the current state of emergency, patients may not be receiving their 3-hours of Medicare-mandated therapy.  See Team Conference Notes for  weekly updates to the plan of care

## 2018-12-19 NOTE — Progress Notes (Signed)
Occupational Therapy Treatment Patient Details Name: Walter Grimes MRN: [id number] DOB: [birth year] Today's Date: 12/19/2018    History of present illness Pt is a 28 y.o. M who presents after a seizure. CT 6/16 showing left parietal hemorrhage with intraventricular extension. Repeat CT 6/18 showing slight increase in left parietal parenchymal hemorrhage. Normal EEG.    OT comments  Pt continues to be very depressed about his situation. He is willing to participate in therapy.  Pt is able to demonstrate AROM in hand/fingers but requires A/AROM with use of left UE for other right UE movement.  Continue to educate pt on increasing safety awareness of right UE when in bed or in recliner due to lack of sensation and decreased movement.  Pt continues to c/o dizziness with activity. Performs sit<>stand with +2 mod assist and stand pivot transfer with +2 max assist. _+2 max assist for standing balance with weight shifts.  Continue to recommend CIR for discharge planning. Will continue to follow acutely.  Follow Up Recommendations  CIR;Supervision/Assistance - 24 hour    Equipment Recommendations  Other (comment)(TBD in next venue)    Recommendations for Other Services      Precautions / Restrictions Precautions Precautions: Fall Precaution Comments: anxious       Mobility Bed Mobility Overal bed mobility: Needs Assistance Bed Mobility: Supine to Sit     Supine to sit: Min assist     General bed mobility comments: Assist to bring hips to EOB.   Transfers Overall transfer level: Needs assistance Equipment used: 2 person hand held assist Transfers: Sit to/from UGI CorporationStand;Stand Pivot Transfers Sit to Stand: +2 physical assistance;Mod assist Stand pivot transfers: +2 physical assistance;Max assist       General transfer comment: Assist for power up and steadying.  Pt requires assist for placement of right foot. Transferred from bed to recliner, going toward left side.     Balance  Overall balance assessment: Needs assistance Sitting-balance support: Feet supported;Single extremity supported Sitting balance-Leahy Scale: Fair     Standing balance support: Bilateral upper extremity supported Standing balance-Leahy Scale: Poor Standing balance comment: +2 max assist to maintain standing balance. Assist for right foot placement. Heavy right bias.                            ADL either performed or assessed with clinical judgement   ADL Overall ADL's : Needs assistance/impaired                 Upper Body Dressing : Moderate assistance;Bed level       Toilet Transfer: +2 for physical assistance;Maximal assistance;Stand-pivot   Toileting- Clothing Manipulation and Hygiene: Maximal assistance;Sitting/lateral lean       Functional mobility during ADLs: +2 for physical assistance;Maximal assistance(2 person HHA with gait belt) General ADL Comments: Pt still very depressed this morning. States he hasn't been sleeping and c/o dizziness throughout session.  Pulled off condom catheter in bed bc he wanted to use urinal instead. Pt attempted to void in urinal in standing and sitting but was not successful.      Vision   Vision Assessment?: Vision impaired- to be further tested in functional context Additional Comments: Pt c/o blurriness but inconsistent with answers when therapist attempting to assess.  Will continue to assess next session.   Perception     Praxis      Cognition Arousal/Alertness: Awake/alert Behavior During Therapy: Anxious;Flat affect Overall Cognitive Status: Impaired/Different from baseline Area of  Impairment: Awareness;Following commands;Safety/judgement;Problem solving                   Current Attention Level: Sustained Memory: Decreased short-term memory Following Commands: Follows one step commands consistently;Follows multi-step commands with increased time Safety/Judgement: Decreased awareness of  safety Awareness: Emergent Problem Solving: Decreased initiation;Difficulty sequencing;Requires verbal cues;Requires tactile cues General Comments: Pt repeatedly saying how dizzy he is today. He denies feeling dizzy yesterday although per PT note yesterday he also c/o dizziness.        Exercises Exercises: General Upper Extremity General Exercises - Upper Extremity Elbow Flexion: AAROM;Right;5 reps;Seated(vcs from therapist) Elbow Extension: AAROM;Right;5 reps;Seated(VCs from therapist) Digit Composite Flexion: AROM;Right;5 reps Composite Extension: AROM;Right;5 reps   Shoulder Instructions       General Comments Max cues for awareness of right UE.  Pt initially attempting to lift Right UE by pulling at thumb. Therapist educated him on re-positioning/moving right UE by grasping right wrist with left hand. Pt able to recall and demo how to safely move UE using left UE at end of session.  Therapist positioning right UE on pillow in recliner at end of session.    Pertinent Vitals/ Pain       Pain Assessment: Faces Faces Pain Scale: Hurts little more Pain Location: right LE Pain Descriptors / Indicators: Numbness;Tingling Pain Intervention(s): Monitored during session;Repositioned  Home Living                                          Prior Functioning/Environment              Frequency  Min 2X/week        Progress Toward Goals  OT Goals(current goals can now be found in the care plan section)  Progress towards OT goals: Progressing toward goals  Acute Rehab OT Goals Patient Stated Goal: "I want to walk again." OT Goal Formulation: Patient unable to participate in goal setting Time For Goal Achievement: 12/25/18 Potential to Achieve Goals: Good ADL Goals Pt Will Perform Eating: with modified independence;sitting Pt Will Perform Grooming: sitting;with supervision Pt Will Perform Upper Body Dressing: with supervision;sitting Pt/caregiver will  Perform Home Exercise Program: Increased strength;Increased ROM;Right Upper extremity;With Supervision;With written HEP provided Additional ADL Goal #1: Pt will sustain attention to ADL task > 5min with min cues. Additional ADL Goal #2: Pt will follow 1 step commands with 75% accuracy during functional task.  Plan Discharge plan remains appropriate    Co-evaluation    PT/OT/SLP Co-Evaluation/Treatment: Yes Reason for Co-Treatment: Complexity of the patient's impairments (multi-system involvement);To address functional/ADL transfers;For patient/therapist safety;Necessary to address cognition/behavior during functional activity   OT goals addressed during session: ADL's and self-care;Strengthening/ROM(functional transfers)      AM-PAC OT "6 Clicks" Daily Activity     Outcome Measure   Help from another person eating meals?: A Lot Help from another person taking care of personal grooming?: A Lot Help from another person toileting, which includes using toliet, bedpan, or urinal?: Total Help from another person bathing (including washing, rinsing, drying)?: A Lot Help from another person to put on and taking off regular upper body clothing?: A Lot Help from another person to put on and taking off regular lower body clothing?: Total 6 Click Score: 10    End of Session Equipment Utilized During Treatment: Gait belt  OT Visit Diagnosis: Muscle weakness (generalized) (M62.81);Other symptoms and signs involving cognitive  function;Other symptoms and signs involving the nervous system (R29.898);Hemiplegia and hemiparesis Hemiplegia - Right/Left: Right Hemiplegia - dominant/non-dominant: Dominant Hemiplegia - caused by: Other Nontraumatic intracranial hemorrhage   Activity Tolerance Patient tolerated treatment well   Patient Left in chair;with chair alarm set   Nurse Communication Mobility status(pt pulled off condom catheter)        Time: 9093-1121 OT Time Calculation (min): 27  min  Charges: OT General Charges $OT Visit: 1 Visit OT Treatments $Self Care/Home Management : 8-22 mins    Darrol Jump OTR/L [place] [phone] 12/19/2018, 10:11 AM

## 2018-12-19 NOTE — Progress Notes (Signed)
Inpatient Rehabilitation Admissions Coordinator  I met with patient at bedside to offer admission to inpt rehab today. He is in agreement. I contacted Dr. Cathlean Sauer to arrange d/c. RN CM , Vida Roller is aware. I will make the arrangements to admit today.  Danne Baxter, RN, MSN Rehab Admissions Coordinator [phone] 12/19/2018 10:39 AM

## 2018-12-19 NOTE — Progress Notes (Signed)
Patient arrived to IP rehab complaining of right sided headache rated 6/10.  Prn given . Patient informed about fall precautions and safety plan. Adria Devon, LPN.

## 2018-12-19 NOTE — H&P (Signed)
Physical Medicine and Rehabilitation Admission H&P        Chief Complaint  Patient presents with  . Altered Mental Status  : HPI: Walter Grimes is a 28 year old right-handed male with documented history of alcohol and tobacco use on no prescription medications.  Per chart review patient lives with girlfriend of 1 month and independent prior to admission.  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  Plans to stay with his mother and stepfather on discharge with assistance as needed.  Presented 12/08/2018 with altered mental status unresponsiveness and reported seizure by EMS lasting approximately 3 minutes.  Urine drug screen positive marijuana.  Cranial CT/MRI scan showed a 5.8 x 2.8 cm left parietal hemorrhage with intraventricular extension with 4 mm left to right midline shift.  Normal MRA of the head.  Initial EEG negative for seizure.  Follow-up EEG 12/12/2018 and 12/14/2018 left focal slowing with few electrographic seizures.  Patient loaded with Keppra and later changed to valproate 750 mg every 8 hours..  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  Follow-up MRI 12/16/2018 unchanged left parietal hemorrhage with no enhancing lesions or tumor.  Patient initially maintained on 3% for cerebral edema since resolved and discontinued.  Echocardiogram with ejection fraction of 65%.  His diet has been advanced to regular.  Maintained on Fioricet for bouts of headaches.  Therapy evaluations completed and patient was admitted for a comprehensive rehab program.   Review of Systems  Constitutional: Negative for chills and fever.  HENT: Negative for hearing loss.   Eyes: Positive for blurred vision. Negative for double vision.  Respiratory: Negative for cough and shortness of breath.   Cardiovascular: Negative for chest pain, palpitations and leg swelling.  Gastrointestinal: Positive for constipation. Negative for heartburn, nausea and  vomiting.  Genitourinary: Negative for dysuria, flank pain and hematuria.  Skin: Negative for rash.  Neurological: Positive for dizziness, seizures and headaches.  All other systems reviewed and are negative.   History reviewed. No pertinent past medical history. History reviewed. No pertinent surgical history. History reviewed. No pertinent family history. Social History:  reports that he has been smoking cigarettes. He has been smoking about 1.00 pack per day. He has never used smokeless tobacco. He reports current alcohol use. He reports current drug use. Drug: Marijuana. 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  Allergies: No Known Allergies No medications prior to admission.     Drug Regimen Review Drug regimen was reviewed and remains appropriate with no significant issues identified   Home: Home Living Family/patient expects to be discharged to:: Private residence Living Arrangements: Spouse/significant other(girlfriend) Available Help at Discharge: Friend(s)  Lives With: Significant other   Functional History: Prior Function Level of Independence: Independent Comments: Works as Futures trader"sound engineer"   Functional Status:  Mobility: Bed Mobility Overal bed mobility: Needs Assistance Bed Mobility: Supine to Sit Supine to sit: Mod assist General bed mobility comments: pt admits much more ability to activate his trunk today. Pt able to utilize L LE to move R LE and bedrail to pull himself to EoB, requiring moD A for R UE managment and trunk to upright  Transfers Overall transfer level: Needs assistance Equipment used: 2 person hand held assist(raised bedrail) Transfer via Lift Equipment: Stedy Transfers: Sit to/from Stand, Pharmacologisttand Pivot Transfers Sit to Stand: +2 safety/equipment, Min assist, Mod assist Stand pivot transfers: Mod assist, +2 physical assistance, +2 safety/equipment Squat pivot transfers: +2 physical assistance, Max assist  General transfer comment: minA for power  up into Stedy with stronger L side, transferred to recliner so that he could sit>stand from recliner using bed rail in raised position, requires modAx1 for powerup with modAx1 for blocking R knee, able to stand practice weightshifts and moving L LE, difficulty with hyperextension of R knee to achieve.  Ambulation/Gait General Gait Details: Pt participated in pre-gait activity standing with the Stedy. Pt attempted 2x5 TKE in standing on the R with therapist facilitating full extension of R knee. Pt was able to weight shift R and L and march his LLE up x5 as well with therapist blocking R knee.      ADL: ADL Overall ADL's : Needs assistance/impaired Grooming: Wash/dry face, Oral care, Brushing hair, Minimal assistance Grooming Details (indicate cue type and reason): in partial sitting on Stedy.  Pt required assistance for setup of items (to open toothpaste, hold spit cup) Upper Body Bathing: Minimal assistance Lower Body Bathing: +2 for physical assistance, Sit to/from stand Toilet Transfer: +2 for physical assistance, BSC, Moderate assistance Toilet Transfer Details (indicate cue type and reason): Stedy for transfer Functional mobility during ADLs: +2 for physical assistance General ADL Comments: pt tearful and withdrawn intermittently during session, very depressed with current situation.  Pt would benefit from visitor presence for increased motivation   Cognition: Cognition Overall Cognitive Status: Impaired/Different from baseline Arousal/Alertness: Awake/alert Orientation Level: Oriented X4 Attention: Focused, Sustained Focused Attention: Impaired Focused Attention Impairment: Verbal basic Sustained Attention: Impaired Sustained Attention Impairment: Verbal basic Memory: Impaired Memory Impairment: Storage deficit, Decreased recall of new information Awareness: Impaired Awareness Impairment: Intellectual impairment Problem Solving: Impaired Problem Solving Impairment: Verbal  complex Cognition Arousal/Alertness: Awake/alert Behavior During Therapy: Anxious Overall Cognitive Status: Impaired/Different from baseline Area of Impairment: Awareness, Following commands, Safety/judgement, Problem solving Current Attention Level: Sustained Memory: Decreased short-term memory Following Commands: Follows one step commands consistently, Follows multi-step commands with increased time Safety/Judgement: Decreased awareness of safety Awareness: Emergent Problem Solving: Decreased initiation, Difficulty sequencing, Requires verbal cues, Requires tactile cues General Comments: R inattention noted, even with cues to attend to it.    Physical Exam: Blood pressure 115/75, pulse 69, temperature 98.1 F (36.7 C), temperature source Oral, resp. rate 18, height 5\' 8"  (1.727 m), weight 76.6 kg, SpO2 99 %. Physical Exam  Constitutional: He appears well-developed and well-nourished.  HENT:  Head: Normocephalic and atraumatic.  Eyes: Pupils are equal, round, and reactive to light. Conjunctivae are normal.  Neck: Normal range of motion. No tracheal deviation present. No thyromegaly present.  Cardiovascular: Normal rate and regular rhythm.  Respiratory: Effort normal. No respiratory distress. He has no wheezes.  GI: Soft. He exhibits no distension. There is no abdominal tenderness.  Neurological:  Pt sitting in chair. Restless. Some inattention to right Language appears to be intact. Oriented to person and place, provided biographical info. Some delays in processing and sequencing. RUE: trace deltoid, bices, triceps, 2/5 grip. [name] 0-tr HE,KE and 0/5 ADF, PF. LUE and LLE grossly 4-5/5. Decreased sensation [name]>RUE. Able to sense gross touch throughout right side and to a lesser extent pain.   Skin: Skin is warm and dry.  Numerous tattoos.   Psychiatric:  Anxious, distracted      Lab Results Last 48 Hours  No results found for this or any previous visit (from the past 48 hour(s)).    Imaging Results (Last 48 hours)  No results found.           Medical Problem List and Plan:  1.   Cognitive deficits with decreased functional mobility secondary to left parietal frontal ICH with IVH             -admit to inpatient rehab 2.  Antithrombotics: -DVT/anticoagulation: Subcutaneous heparin initiated 12/12/2018             -antiplatelet therapy: N/A 3. Pain Management: Tramadol 100 mg every 6 hours and Fioricet as needed for headache 4. Mood: Xanax 0.25 mg 3 times daily             -antipsychotic agents: N/A 5. Neuropsych: This patient is not capable of making decisions on his own behalf. 6. Skin/Wound Care: Routine skin checks 7. Fluids/Electrolytes/Nutrition: encourage PO             -labs ordered on admit 8.  Seizure disorder.  Valproate 750 mg every 8 hours.  Latest valproic acid level 57 9.  History of alcohol tobacco and marijuana use.  Provide counseling   Post Admission Physician Evaluation: 1. Functional deficits secondary  to left frontal-parietal ICH with IVH. 2. Patient is admitted to receive collaborative, interdisciplinary care between the physiatrist, rehab nursing staff, and therapy team. 3. Patient's level of medical complexity and substantial therapy needs in context of that medical necessity cannot be provided at a lesser intensity of care such as a SNF. 4. Patient has experienced substantial functional loss from his/her baseline which was documented above under the "Functional History" and "Functional Status" headings.  Judging by the patient's diagnosis, physical exam, and functional history, the patient has potential for functional progress which will result in measurable gains while on inpatient rehab.  These gains will be of substantial and practical use upon discharge  in facilitating mobility and self-care at the household level. 5. Physiatrist will provide 24 hour management of medical needs as well as oversight of the therapy plan/treatment and provide  guidance as appropriate regarding the interaction of the two. 6. The Preadmission Screening has been reviewed and patient status is unchanged unless otherwise stated above. 7. 24 hour rehab nursing will assist with bladder management, bowel management, safety, skin/wound care, disease management, medication administration, pain management and patient education  and help integrate therapy concepts, techniques,education, etc. 8. PT will assess and treat for/with: Lower extremity strength, range of motion, stamina, balance, functional mobility, safety, adaptive techniques and equipment, NMR.   Goals are: supervision. 9. OT will assess and treat for/with: ADL's, functional mobility, safety, upper extremity strength, adaptive techniques and equipment, NMR, community reentry.   Goals are: supervision. Therapy may proceed with showering this patient. 10. SLP will assess and treat for/with: cognition, language, communication.  Goals are: supervision. 11. Case Management and Social Worker will assess and treat for psychological issues and discharge planning. 12. Team conference will be held weekly to assess progress toward goals and to determine barriers to discharge. 13. Patient will receive at least 3 hours of therapy per day at least 5 days per week. 14. ELOS: 10-14 days       15. Prognosis:  excellent   I have personally performed a face to face diagnostic evaluation of this patient and formulated the key components of the plan.  Additionally, I have personally reviewed laboratory data, imaging studies, as well as relevant notes and concur with the physician assistant's documentation above.  Ranelle OysterZachary T. Swartz, MD, FAAPMR       Mcarthur Rossettianiel J Angiulli, PA-C 12/19/2018

## 2018-12-19 NOTE — Progress Notes (Signed)
[name], MD  Physician  Physical Medicine and Rehabilitation  PMR Pre-admission  Signed  Date of Service:  12/19/2018 11:28 AM      Related encounter: ED to Hosp-Admission (Current) from 12/08/2018 in [facility]      Signed         Show:Clear all '[x]' Manual'[x]' Template'[x]' Copied  Added by: '[x]' Cristina Gong, RN'[x]' [name], MD  '[]' Hover for details PMR Admission Coordinator Pre-Admission Assessment  Patient: Walter Grimes is an 28 y.o., male MRN: [id number] DOB: [birth year] Height: '5\' 8"'  (172.7 cm) Weight: 76.6 kg  Insurance Information HMO:     PPO:      PCP:      IPA:      80/20:      OTHER:  PRIMARY: Uninsured        I left voicemail for [employer] 6/23 at 1245 to request disability and Medicaid applications with Mom assisting patient to complete. Repeat calls 6/25 and 6/26 no follow up. I left voicemail for Diplomatic Services operational officer, Mortimer Fries 6/26 to request assistance with f/u from [place].  Medicaid Application Date:       Case Manager:  Disability Application Date:       Case Worker:   The Data Collection Information Summary for patients in Inpatient Rehabilitation Facilities with attached Privacy Act [place] Records was provided and verbally reviewed with: N/A  Emergency Contact Information         Contact Information    Name Relation Home Work Mobile   [place] Mother   [phone]   [place], [place] Significant other   [phone]   [place] Stepfather   [phone]      Current Medical History  Patient Admitting Diagnosis: ICH  History of Present Illness:  28 year old right-handed male with documented history of alcohol and tobacco use on no prescription medications. Presented 12/08/2018 with altered mental status unresponsiveness and reported seizure by EMS lasting approximately 3 minutes. Urine drug screen positive marijuana. Cranial CT/MRI scan  showed a 5.8 x 2.8 cm left parietal hemorrhage with intraventricular extension with 4 mm left to right midline shift. Normal MRA of the head. Initial EEG negative for seizure. Follow-up EEG 12/12/2018 and 12/14/2018 left focal slowing with few electrographic seizures. Patient loaded with Keppra and later changed to valproate 750 mg every 8 hours.. 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 Follow-up MRI 12/16/2018 unchanged left parietal hemorrhage with no enhancing lesions or tumor. Patient initially maintained on 3% for cerebral edema since resolved and discontinued. Echocardiogram with ejection fraction of 65%. His diet has been advanced to regular. Maintained on Fioricet for bouts of headaches.   Complete NIHSS TOTAL: 7  Patient[facility]  has been reviewed by the rehabilitation admission coordinator and physician.  Past Medical History  History reviewed. No pertinent past medical history.  Family History   family history is not on file.  Prior Rehab/Hospitalizations Has the patient had prior rehab or hospitalizations prior to admission? Yes  Has the patient had major surgery during 100 days prior to admission? No              Current Medications  Current Facility-Administered Medications:     stroke: mapping our early stages of recovery book, , Does not apply, Once, Biby, Sharon L, NP   0.9 %  sodium chloride infusion, , Intravenous, PRN, Burnetta Sabin L, NP, Last Rate: 10 mL/hr at 12/15/18 2300, 250 mL at  12/15/18 2300   acetaminophen (TYLENOL) tablet 650 mg, 650 mg, Oral, Q4H PRN, 650 mg at 12/18/18 1959 **OR** acetaminophen (TYLENOL) solution 650 mg, 650 mg, Per Tube, Q4H PRN **OR** acetaminophen (TYLENOL) suppository 650 mg, 650 mg, Rectal, Q4H PRN, Donzetta Starch, NP   ALPRAZolam Duanne Moron) tablet 0.25 mg, 0.25 mg, Oral, TID, Biby, Sharon L, NP, 0.25 mg at 12/19/18 0918   bisacodyl (DULCOLAX)  suppository 10 mg, 10 mg, Rectal, Daily PRN, Darliss Cheney, MD, 10 mg at 12/17/18 2215   butalbital-acetaminophen-caffeine (FIORICET) 50-325-40 MG per tablet 1 tablet, 1 tablet, Oral, Q8H PRN, Donzetta Starch, NP, 1 tablet at 12/19/18 0323   Chlorhexidine Gluconate Cloth 2 % PADS 6 each, 6 each, Topical, Daily, Biby, Sharon L, NP, 6 each at 12/19/18 0920   heparin injection 5,000 Units, 5,000 Units, Subcutaneous, Q8H, Biby, Sharon L, NP, 5,000 Units at 12/19/18 0527   hydrALAZINE (APRESOLINE) injection 10 mg, 10 mg, Intravenous, Q4H PRN, Burnetta Sabin L, NP, 10 mg at 12/11/18 2121   naloxone (NARCAN) injection 0.2 mg, 0.2 mg, Intravenous, PRN, Burnetta Sabin L, NP, 0.2 mg at 12/12/18 2320   ondansetron (ZOFRAN) injection 4 mg, 4 mg, Intravenous, Q6H PRN, Biby, Sharon L, NP, 4 mg at 12/12/18 0518   pantoprazole (PROTONIX) EC tablet 40 mg, 40 mg, Oral, Daily, Biby, Sharon L, NP, 40 mg at 12/19/18 0918   polyethylene glycol (MIRALAX / GLYCOLAX) packet 17 g, 17 g, Oral, Daily PRN, Pahwani, Einar Grad, MD, 17 g at 12/18/18 2206   senna-docusate (Senokot-S) tablet 1 tablet, 1 tablet, Oral, BID, Biby, Sharon L, NP, 1 tablet at 12/19/18 1856   traMADol (ULTRAM) tablet 100 mg, 100 mg, Oral, Q6H, Biby, Sharon L, NP, 100 mg at 12/19/18 0530   valproic acid (DEPAKENE) 250 MG capsule 750 mg, 750 mg, Oral, TID, Arrien, Jimmy Picket, MD, 750 mg at 12/19/18 3149  Patients Current Diet:     Diet Order                  Diet regular Room service appropriate? Yes with Assist; Fluid consistency: Thin  Diet effective now               Precautions / Restrictions Precautions Precautions: Fall Precaution Comments: anxious Restrictions Weight Bearing Restrictions: No   Has the patient had 2 or more falls or a fall with injury in the past year? No  Prior Activity Level Community (5-7x/wk): Independent without AD; just lost job at [employer]: Did the patient  need help bathing, dressing, using the toilet or eating? Independent  Indoor Mobility: Did the patient need assistance with walking from room to room (with or without device)? Independent  Stairs: Did the patient need assistance with internal or external stairs (with or without device)? Independent  Functional Cognition: Did the patient need help planning regular tasks such as shopping or remembering to take medications? Independent  Home Assistive Devices / Equipment Home Assistive Devices/Equipment: Bedside commode/3-in-1, Hospital bed  Prior Device Use: Indicate devices/aids used by the patient prior to current illness, exacerbation or injury? None of the above  Current Functional Level Cognition  Arousal/Alertness: Awake/alert Overall Cognitive Status: Impaired/Different from baseline Current Attention Level: Sustained Orientation Level: Oriented X4 Following Commands: Follows one step commands consistently, Follows multi-step commands with increased time Safety/Judgement: Decreased awareness of safety General Comments: Pt repeatedly saying how dizzy he is today. He denies feeling dizzy yesterday although per PT note yesterday he also c/o  dizziness. Attention: Focused, Sustained Focused Attention: Impaired Focused Attention Impairment: Verbal basic Sustained Attention: Impaired Sustained Attention Impairment: Verbal basic Memory: Impaired Memory Impairment: Storage deficit, Decreased recall of new information Awareness: Impaired Awareness Impairment: Intellectual impairment Problem Solving: Impaired Problem Solving Impairment: Verbal complex    Extremity Assessment (includes Sensation/Coordination)  Upper Extremity Assessment: RUE deficits/detail RUE Deficits / Details: no active movement noted, pt reports he can't feel RUE when therapist moving through ROM; PROM WFL RUE Sensation: decreased light touch, decreased proprioception RUE Coordination: decreased fine  motor, decreased gross motor  Lower Extremity Assessment: Defer to PT evaluation [name] Deficits / Details: No active movement noted, PROM WFL. Tendency for right foot to rest in increased dorsiflexion/inversion    ADLs  Overall ADL's : Needs assistance/impaired Grooming: Wash/dry face, Oral care, Brushing hair, Minimal assistance Grooming Details (indicate cue type and reason): in partial sitting on Stedy.  Pt required assistance for setup of items (to open toothpaste, hold spit cup) Upper Body Bathing: Minimal assistance Lower Body Bathing: +2 for physical assistance, Sit to/from stand Upper Body Dressing : Moderate assistance, Bed level Toilet Transfer: +2 for physical assistance, Maximal assistance, Stand-pivot Toilet Transfer Details (indicate cue type and reason): Stedy for transfer Toileting- Clothing Manipulation and Hygiene: Maximal assistance, Sitting/lateral lean Functional mobility during ADLs: +2 for physical assistance, Maximal assistance(2 person HHA with gait belt) General ADL Comments: Pt still very depressed this morning. States he hasn't been sleeping and c/o dizziness throughout session.  Pulled off condom catheter in bed bc he wanted to use urinal instead. Pt attempted to void in urinal in standing and sitting but was not successful.     Mobility  Overal bed mobility: Needs Assistance Bed Mobility: Supine to Sit Supine to sit: Min assist General bed mobility comments: Assist to bring hips to EOB.     Transfers  Overall transfer level: Needs assistance Equipment used: 2 person hand held assist Transfer via Lift Equipment: Stedy Transfers: Sit to/from Stand, Risk manager Sit to Stand: +2 physical assistance, Mod assist Stand pivot transfers: +2 physical assistance, Max assist Squat pivot transfers: +2 physical assistance, Max assist General transfer comment: assistance required for R LE positioning prior to stand; assist to power up into standing and for  balance/weight shifting for pivot EOB to recliner; pt with heavy R lateral lean     Ambulation / Gait / Stairs / Wheelchair Mobility  Ambulation/Gait General Gait Details: Pt participated in pre-gait activity standing with the [place]. Pt attempted 2x5 TKE in standing on the R with therapist facilitating full extension of R knee. Pt was able to weight shift R and L and march his LLE up x5 as well with therapist blocking R knee.     Posture / Balance Dynamic Sitting Balance Sitting balance - Comments: Requires varying assist to maintain static sitting balance- anywhere from min A-Max A. Pt with complete LOB x2 to right when letting go of LUE. No trunk activation on right side. Anterior lean, right lateral lean. Poor awareness of body/balance. Balance Overall balance assessment: Needs assistance Sitting-balance support: Feet supported, Single extremity supported Sitting balance-Leahy Scale: Fair Sitting balance - Comments: Requires varying assist to maintain static sitting balance- anywhere from min A-Max A. Pt with complete LOB x2 to right when letting go of LUE. No trunk activation on right side. Anterior lean, right lateral lean. Poor awareness of body/balance. Postural control: Right lateral lean, Posterior lean Standing balance support: Bilateral upper extremity supported Standing balance-Leahy Scale: Poor Standing balance  comment: +2 max assist to maintain standing balance. Assist for right foot placement. Heavy right bias.     Special needs/care consideration BiPAP/CPAP  N/a CPM  N/a Continuous Drip IV  N/a Dialysis  N/a Life Vest  N/a Oxygen  N/a Special Bed  N/a Trach Size  N/a Wound Vac n/a Skin intact Bowel mgmt: LBM 6/23 continent Bladder mgmt:  External catheter Diabetic mgmt:  N/a Behavioral consideration patient depressed and frustrated; flat affect Chemo/radiation  N/a   Previous Home Environment  Living Arrangements: Spouse/significant other(lived for one month  with GF, AMber and her 64 year old child)  Lives With: Significant other(Amber) Available Help at Discharge: Family, Available 24 hours/day(Mom and STep Dad can provide 24/7 supervision) Type of Home: Apartment Home Layout: Two level, Bed/bath upstairs Bathroom Shower/Tub: Tub/shower unit, Architectural technologist: Standard Bathroom Accessibility: Yes How Accessible: Accessible via walker [place]ana: No Type of Home Care Services: Home OT, Home PT, Home RN Additional Comments: no hh  Discharge Living Setting Plans for Discharge Living Setting: Lives with (comment)(to go live with Mom, McRoberts and nephews) Type of Home at Discharge: House Discharge Home Layout: One level Discharge Home Access: Stairs to enter Entrance Stairs-Rails: Right, Left, Can reach both Entrance Stairs-Number of Steps: 2 Discharge Bathroom Shower/Tub: Tub/shower unit, Curtain Discharge Bathroom Toilet: Standard Discharge Bathroom Accessibility: Yes How Accessible: Accessible via walker Does the patient have any problems obtaining your medications?: Yes (Describe)(uninsured)  Social/Family/Support Systems Patient Roles: Parent(Has 3 children not in the home, 68,23, and 30 years old) Contact Information: Mom, Christy Gentles Anticipated Caregiver: Mom and Step dad Anticipated Caregiver's Contact Information: [phone] Ability/Limitations of Caregiver: Mom is disabled but can provide supervision to min asisst Caregiver Availability: 24/7 Discharge Plan Discussed with Primary Caregiver: Yes Is Caregiver In Agreement with Plan?: Yes Does Caregiver/Family have Issues with Lodging/Transportation while Pt is in Rehab?: No   Girlfriend, Museum/gallery conservator states pt can not return home with her. Mom states he has lived with Museum/gallery conservator for one Month. Mom has agreed for pt to return home with her instead of SNF. Patient states he was living part time with his Mom prior to living with Amber and had his own place  too.  Goals/Additional Needs Patient/Family Goal for Rehab: supervision PT, OT, and SLP Expected length of stay: ELOS 10 to 14 days Special Service Needs: patient very depressed and frustraated with his situation Pt/Family Agrees to Admission and willing to participate: Yes Program Orientation Provided & Reviewed with Pt/Caregiver Including Roles  & Responsibilities: Yes  Decrease burden of Care through IP rehab admission: n/a  Possible need for SNF placement upon discharge:  Not anticipated  Patient Condition: I have reviewed medical records from [facility] , spoken with CM, and patient and family member. I met with patient at the bedside for inpatient rehabilitation assessment.  Patient will benefit from ongoing PT, OT and SLP, can actively participate in 3 hours of therapy a day 5 days of the week, and can make measurable gains during the admission.  Patient will also benefit from the coordinated team approach during an Inpatient Acute Rehabilitation admission.  The patient will receive intensive therapy as well as Rehabilitation physician, nursing, social worker, and care management interventions.  Due to bladder management, bowel management, safety, skin/wound care, disease management, medication administration, pain management and patient education the patient requires 24 hour a day rehabilitation nursing.  The patient is currently mod to max assist with mobility and basic ADLs.  Discharge setting  and therapy post discharge at home with home health is anticipated.  Patient has agreed to participate in the Acute Inpatient Rehabilitation Program and will admit today.  Preadmission Screen Completed By:  Cleatrice Burke RN MSN, 12/19/2018 11:28 AM ______________________________________________________________________   Discussed status with Dr. Naaman Plummer  on  12/19/2018 at  1140 and received approval for admission today.  Admission Coordinator:  Cleatrice Burke, RN MSN  time  1140 Date  12/19/2018   Assessment/Plan: Diagnosis: left parietal ICH with IVH 1. Does the need for close, 24 hr/day Medical supervision in concert with the patient's rehab needs make it unreasonable for this patient to be served in a less intensive setting? Yes 2. Co-Morbidities requiring supervision/potential complications: etoh use, seizures 3. Due to bladder management, bowel management, safety, skin/wound care, disease management, medication administration, pain management and patient education, does the patient require 24 hr/day rehab nursing? Yes 4. Does the patient require coordinated care of a physician, rehab nurse, PT (1-2 hrs/day, 5 days/week), OT (1-2 hrs/day, 5 days/week) and SLP (1-2 hrs/day, 5 days/week) to address physical and functional deficits in the context of the above medical diagnosis(es)? Yes Addressing deficits in the following areas: balance, endurance, locomotion, strength, transferring, bowel/bladder control, bathing, dressing, feeding, grooming, toileting, cognition, speech and psychosocial support 5. Can the patient actively participate in an intensive therapy program of at least 3 hrs of therapy 5 days a week? Yes 6. The potential for patient to make measurable gains while on inpatient rehab is excellent 7. Anticipated functional outcomes upon discharge from inpatients are: supervision PT, supervision OT, supervision SLP 8. Estimated rehab length of stay to reach the above functional goals is: 10-14 days 9. Anticipated D/C setting: Home 10. Anticipated post D/C treatments: [place] therapy 11. Overall Rehab/Functional Prognosis: excellent  MD Signature: [name], MD, [facility] Physical Medicine & Rehabilitation 12/19/2018         Revision History

## 2018-12-19 NOTE — Progress Notes (Signed)
  Speech Language Pathology Treatment: Cognitive-Linquistic  Patient Details Name: Walter Grimes MRN: [id number] DOB: 1990-09-12 Today's Date: 12/19/2018 Time: 3149-7026 SLP Time Calculation (min) (ACUTE ONLY): 32 min  Assessment / Plan / Recommendation Clinical Impression  Pt was seen for treatment and was cooperative throughout the session with a flat affect. His vocal intensity was reduced which necessitated multiple requests for clarification from the SLP and he attributed this to his being "drowsy from the medication". He achieved 60% accuracy with recall of objective information from voice mails increasing to 100% accuracy with min-mod cues. He completed a 3-activity sequencing mental manipulation task with 60% accuracy increasing to 100% with repetition and min-mod cues. He provided 5-11 items per concrete category during divergent naming tasks with an average of 7 items per category. He required mod-max support for money management problems even when a calculator was used. Pt currently has discharge orders and will benefit from continued SLP services following discharge.    HPI HPI: Pt is a 28 y.o. male who presents to the ED after a seizure. EMS noted him to be postictal after the seizure, which continued to be present on arrival to the ED. He was not given Versed by EMS. Left hand tremor was noted by EMS. CT of the head of 12/09/18 revealed 5.8 x 2.2 cm left parietal hemorrhage with intraventricular extension. Repeat CT of the head showed slight increase in left parietal parenchymal hemorrhage from 5.4 to 5.8 cm in AP diameter. EEG was normal. CT of the head of 12/11/18: Lt parietal hemorrhage with vasogenic edema, extension into lateral ventricles, no hydrocephalus, 6 mm Lt to Rt shift.      SLP Plan  Continue with current plan of care       Recommendations                   Follow up Recommendations: Inpatient Rehab SLP Visit Diagnosis: Cognitive communication deficit  (V78.588) Plan: Continue with current plan of care       Momina Hunton I. [name], [place], [place] Office number [phone] Pager [place] 12/19/2018, 11:13 AM

## 2018-12-19 NOTE — PMR Pre-admission (Signed)
PMR Admission Coordinator Pre-Admission Assessment  Patient: Walter Grimes is an 28 y.o., male MRN: [id number] DOB: January 29, 1991 Height: '5\' 8"'  (172.7 cm) Weight: 76.6 kg  Insurance Information HMO:     PPO:      PCP:      IPA:      80/20:      OTHER:  PRIMARY: Uninsured        I left voicemail for [employer] 6/23 at 1245 to request disability and Medicaid applications with Mom assisting patient to complete. Repeat calls 6/25 and 6/26 no follow up. I left voicemail for Diplomatic Services operational officer, Mortimer Fries 6/26 to request assistance with f/u from [place].  Medicaid Application Date:       Case Manager:  Disability Application Date:       Case Worker:   The "Data Collection Information Summary" for patients in Inpatient Rehabilitation Facilities with attached "Privacy Act [facility] [place] Records" was provided and verbally reviewed with: N/A  Emergency Contact Information Contact Information    Name Relation Home Work Mobile   [place] Mother   [phone]   [place], [place] Significant other   [phone]   [place] Stepfather   [phone]      Current Medical History  Patient Admitting Diagnosis: ICH  History of Present Illness:  28 year old right-handed male with documented history of alcohol and tobacco use on no prescription medications. Presented 12/08/2018 with altered mental status unresponsiveness and reported seizure by EMS lasting approximately 3 minutes.  Urine drug screen positive marijuana.  Cranial CT/MRI scan showed a 5.8 x 2.8 cm left parietal hemorrhage with intraventricular extension with 4 mm left to right midline shift.  Normal MRA of the head.  Initial EEG negative for seizure.  Follow-up EEG 12/12/2018 and 12/14/2018 left focal slowing with few electrographic seizures.  Patient loaded with Keppra and later changed to valproate 750 mg every 8 hours..  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  Follow-up  MRI 12/16/2018 unchanged left parietal hemorrhage with no enhancing lesions or tumor.  Patient initially maintained on 3% for cerebral edema since resolved and discontinued.  Echocardiogram with ejection fraction of 65%.  His diet has been advanced to regular.  Maintained on Fioricet for bouts of headaches.    Complete NIHSS TOTAL: 7  Patient's medical record from [facility]  has been reviewed by the rehabilitation admission coordinator and physician.  Past Medical History  History reviewed. No pertinent past medical history.  Family History   family history is not on file.  Prior Rehab/Hospitalizations Has the patient had prior rehab or hospitalizations prior to admission? Yes  Has the patient had major surgery during 100 days prior to admission? No   Current Medications  Current Facility-Administered Medications:  .   stroke: mapping our early stages of recovery book, , Does not apply, Once, Biby, Sharon L, NP .  0.9 %  sodium chloride infusion, , Intravenous, PRN, Burnetta Sabin L, NP, Last Rate: 10 mL/hr at 12/15/18 2300, 250 mL at 12/15/18 2300 .  acetaminophen (TYLENOL) tablet 650 mg, 650 mg, Oral, Q4H PRN, 650 mg at 12/18/18 1959 **OR** acetaminophen (TYLENOL) solution 650 mg, 650 mg, Per Tube, Q4H PRN **OR** acetaminophen (TYLENOL) suppository 650 mg, 650 mg, Rectal, Q4H PRN, Biby, Sharon L, NP .  ALPRAZolam Duanne Moron) tablet 0.25 mg, 0.25 mg, Oral, TID, Burnetta Sabin L, NP, 0.25 mg at 12/19/18 0918 .  bisacodyl (DULCOLAX) suppository 10 mg, 10 mg, Rectal, Daily PRN, Pahwani, Ravi,  MD, 10 mg at 12/17/18 2215 .  butalbital-acetaminophen-caffeine (FIORICET) 50-325-40 MG per tablet 1 tablet, 1 tablet, Oral, Q8H PRN, Donzetta Starch, NP, 1 tablet at 12/19/18 0323 .  Chlorhexidine Gluconate Cloth 2 % PADS 6 each, 6 each, Topical, Daily, Biby, Sharon L, NP, 6 each at 12/19/18 0920 .  heparin injection 5,000 Units, 5,000 Units, Subcutaneous, Q8H, Biby, Sharon L, NP, 5,000 Units at  12/19/18 0527 .  hydrALAZINE (APRESOLINE) injection 10 mg, 10 mg, Intravenous, Q4H PRN, Burnetta Sabin L, NP, 10 mg at 12/11/18 2121 .  naloxone [facility]) injection 0.2 mg, 0.2 mg, Intravenous, PRN, Donzetta Starch, NP, 0.2 mg at 12/12/18 2320 .  ondansetron (ZOFRAN) injection 4 mg, 4 mg, Intravenous, Q6H PRN, Burnetta Sabin L, NP, 4 mg at 12/12/18 0518 .  pantoprazole (PROTONIX) EC tablet 40 mg, 40 mg, Oral, Daily, Biby, Sharon L, NP, 40 mg at 12/19/18 0918 .  polyethylene glycol (MIRALAX / GLYCOLAX) packet 17 g, 17 g, Oral, Daily PRN, Darliss Cheney, MD, 17 g at 12/18/18 2206 .  senna-docusate (Senokot-S) tablet 1 tablet, 1 tablet, Oral, BID, Donzetta Starch, NP, 1 tablet at 12/19/18 0918 .  traMADol (ULTRAM) tablet 100 mg, 100 mg, Oral, Q6H, Biby, Sharon L, NP, 100 mg at 12/19/18 0530 .  valproic acid (DEPAKENE) 250 MG capsule 750 mg, 750 mg, Oral, TID, Arrien, Jimmy Picket, MD, 750 mg at 12/19/18 5188  Patients Current Diet:  Diet Order            Diet regular Room service appropriate? Yes with Assist; Fluid consistency: Thin  Diet effective now              Precautions / Restrictions Precautions Precautions: Fall Precaution Comments: anxious Restrictions Weight Bearing Restrictions: No   Has the patient had 2 or more falls or a fall with injury in the past year? No  Prior Activity Level Community (5-7x/wk): Independent without AD; just lost job at [employer]: Did the patient need help bathing, dressing, using the toilet or eating? Independent  Indoor Mobility: Did the patient need assistance with walking from room to room (with or without device)? Independent  Stairs: Did the patient need assistance with internal or external stairs (with or without device)? Independent  Functional Cognition: Did the patient need help planning regular tasks such as shopping or remembering to take medications? Independent  Home Assistive Devices / Equipment Home  Assistive Devices/Equipment: Bedside commode/3-in-1, Hospital bed  Prior Device Use: Indicate devices/aids used by the patient prior to current illness, exacerbation or injury? None of the above  Current Functional Level Cognition  Arousal/Alertness: Awake/alert Overall Cognitive Status: Impaired/Different from baseline Current Attention Level: Sustained Orientation Level: Oriented X4 Following Commands: Follows one step commands consistently, Follows multi-step commands with increased time Safety/Judgement: Decreased awareness of safety General Comments: Pt repeatedly saying how dizzy he is today. He denies feeling dizzy yesterday although per PT note yesterday he also c/o dizziness. Attention: Focused, Sustained Focused Attention: Impaired Focused Attention Impairment: Verbal basic Sustained Attention: Impaired Sustained Attention Impairment: Verbal basic Memory: Impaired Memory Impairment: Storage deficit, Decreased recall of new information Awareness: Impaired Awareness Impairment: Intellectual impairment Problem Solving: Impaired Problem Solving Impairment: Verbal complex    Extremity Assessment (includes Sensation/Coordination)  Upper Extremity Assessment: RUE deficits/detail RUE Deficits / Details: no active movement noted, pt reports he can't feel RUE when therapist moving through ROM; PROM WFL RUE Sensation: decreased light touch, decreased proprioception RUE Coordination: decreased fine  motor, decreased gross motor  Lower Extremity Assessment: Defer to PT evaluation [name] Deficits / Details: No active movement noted, PROM WFL. Tendency for right foot to rest in increased dorsiflexion/inversion    ADLs  Overall ADL's : Needs assistance/impaired Grooming: Wash/dry face, Oral care, Brushing hair, Minimal assistance Grooming Details (indicate cue type and reason): in partial sitting on Stedy.  Pt required assistance for setup of items (to open toothpaste, hold spit  cup) Upper Body Bathing: Minimal assistance Lower Body Bathing: +2 for physical assistance, Sit to/from stand Upper Body Dressing : Moderate assistance, Bed level Toilet Transfer: +2 for physical assistance, Maximal assistance, Stand-pivot Toilet Transfer Details (indicate cue type and reason): Stedy for transfer Toileting- Clothing Manipulation and Hygiene: Maximal assistance, Sitting/lateral lean Functional mobility during ADLs: +2 for physical assistance, Maximal assistance(2 person HHA with gait belt) General ADL Comments: Pt still very depressed this morning. States he hasn't been sleeping and c/o dizziness throughout session.  Pulled off condom catheter in bed bc he wanted to use urinal instead. Pt attempted to void in urinal in standing and sitting but was not successful.     Mobility  Overal bed mobility: Needs Assistance Bed Mobility: Supine to Sit Supine to sit: Min assist General bed mobility comments: Assist to bring hips to EOB.     Transfers  Overall transfer level: Needs assistance Equipment used: 2 person hand held assist Transfer via Lift Equipment: Stedy Transfers: Sit to/from Stand, Risk manager Sit to Stand: +2 physical assistance, Mod assist Stand pivot transfers: +2 physical assistance, Max assist Squat pivot transfers: +2 physical assistance, Max assist General transfer comment: assistance required for R LE positioning prior to stand; assist to power up into standing and for balance/weight shifting for pivot EOB to recliner; pt with heavy R lateral lean     Ambulation / Gait / Stairs / Wheelchair Mobility  Ambulation/Gait General Gait Details: Pt participated in pre-gait activity standing with the [place]. Pt attempted 2x5 TKE in standing on the R with therapist facilitating full extension of R knee. Pt was able to weight shift R and L and march his LLE up x5 as well with therapist blocking R knee.     Posture / Balance Dynamic Sitting Balance Sitting  balance - Comments: Requires varying assist to maintain static sitting balance- anywhere from min A-Max A. Pt with complete LOB x2 to right when letting go of LUE. No trunk activation on right side. Anterior lean, right lateral lean. Poor awareness of body/balance. Balance Overall balance assessment: Needs assistance Sitting-balance support: Feet supported, Single extremity supported Sitting balance-Leahy Scale: Fair Sitting balance - Comments: Requires varying assist to maintain static sitting balance- anywhere from min A-Max A. Pt with complete LOB x2 to right when letting go of LUE. No trunk activation on right side. Anterior lean, right lateral lean. Poor awareness of body/balance. Postural control: Right lateral lean, Posterior lean Standing balance support: Bilateral upper extremity supported Standing balance-Leahy Scale: Poor Standing balance comment: +2 max assist to maintain standing balance. Assist for right foot placement. Heavy right bias.     Special needs/care consideration BiPAP/CPAP  N/a CPM  N/a Continuous Drip IV  N/a Dialysis  N/a Life Vest  N/a Oxygen  N/a Special Bed  N/a Trach Size  N/a Wound Vac n/a Skin intact Bowel mgmt: LBM 6/23 continent Bladder mgmt:  External catheter Diabetic mgmt:  N/a Behavioral consideration patient depressed and frustrated; flat affect Chemo/radiation  N/a   Previous Home Environment  Living Arrangements:  Spouse/significant other(lived for one month with GF, AMber and her 51 year old child)  Lives With: Significant other([place]) Available Help at Discharge: Family, Available 24 hours/day(Mom and STep Dad can provide 24/7 supervision) Type of Home: Apartment Home Layout: Two level, Bed/bath upstairs Bathroom Shower/Tub: Tub/shower unit, Industrial/product designer: Yes How Accessible: Accessible via walker Anna: No Type of Home Care Services: Home OT, Home PT, Home RN Additional Comments:  no hh  Discharge Living Setting Plans for Discharge Living Setting: Lives with (comment)(to go live with Mom, Leeper and nephews) Type of Home at Discharge: House Discharge Home Layout: One level Discharge Home Access: Stairs to enter Entrance Stairs-Rails: Right, Left, Can reach both Entrance Stairs-Number of Steps: 2 Discharge Bathroom Shower/Tub: Tub/shower unit, Curtain Discharge Bathroom Toilet: Standard Discharge Bathroom Accessibility: Yes How Accessible: Accessible via walker Does the patient have any problems obtaining your medications?: Yes (Describe)(uninsured)  Social/Family/Support Systems Patient Roles: Parent(Has 3 children not in the home, 8,69, and 65 years old) Contact Information: Mom, Christy Gentles Anticipated Caregiver: Mom and Step dad Anticipated Caregiver's Contact Information: [phone] Ability/Limitations of Caregiver: Mom is disabled but can provide supervision to min asisst Caregiver Availability: 24/7 Discharge Plan Discussed with Primary Caregiver: Yes Is Caregiver In Agreement with Plan?: Yes Does Caregiver/Family have Issues with Lodging/Transportation while Pt is in Rehab?: No   Girlfriend, Museum/gallery conservator states pt can not return home with her. Mom states he has lived with Museum/gallery conservator for one Month. Mom has agreed for pt to return home with her instead of SNF. Patient states he was living part time with his Mom prior to living with Amber and had his own place too.  Goals/Additional Needs Patient/Family Goal for Rehab: supervision PT, OT, and SLP Expected length of stay: ELOS 10 to 14 days Special Service Needs: patient very depressed and frustraated with his situation Pt/Family Agrees to Admission and willing to participate: Yes Program Orientation Provided & Reviewed with Pt/Caregiver Including Roles  & Responsibilities: Yes  Decrease burden of Care through IP rehab admission: n/a  Possible need for SNF placement upon discharge:  Not anticipated  Patient  Condition: I have reviewed medical records from [facility] , spoken with CM, and patient and family member. I met with patient at the bedside for inpatient rehabilitation assessment.  Patient will benefit from ongoing PT, OT and SLP, can actively participate in 3 hours of therapy a day 5 days of the week, and can make measurable gains during the admission.  Patient will also benefit from the coordinated team approach during an Inpatient Acute Rehabilitation admission.  The patient will receive intensive therapy as well as Rehabilitation physician, nursing, social worker, and care management interventions.  Due to bladder management, bowel management, safety, skin/wound care, disease management, medication administration, pain management and patient education the patient requires 24 hour a day rehabilitation nursing.  The patient is currently mod to max assist with mobility and basic ADLs.  Discharge setting and therapy post discharge at home with home health is anticipated.  Patient has agreed to participate in the Acute Inpatient Rehabilitation Program and will admit today.  Preadmission Screen Completed By:  Cleatrice Burke RN MSN, 12/19/2018 11:28 AM ______________________________________________________________________   Discussed status with Dr. Naaman Plummer  on  12/19/2018 at  1140 and received approval for admission today.  Admission Coordinator:  Cleatrice Burke, RN MSN time  1140 Date  12/19/2018   Assessment/Plan: Diagnosis: left parietal ICH with IVH 1. Does the need  for close, 24 hr/day Medical supervision in concert with the patient's rehab needs make it unreasonable for this patient to be served in a less intensive setting? Yes 2. Co-Morbidities requiring supervision/potential complications: etoh use, seizures 3. Due to bladder management, bowel management, safety, skin/wound care, disease management, medication administration, pain management and patient education, does the  patient require 24 hr/day rehab nursing? Yes 4. Does the patient require coordinated care of a physician, rehab nurse, PT (1-2 hrs/day, 5 days/week), OT (1-2 hrs/day, 5 days/week) and SLP (1-2 hrs/day, 5 days/week) to address physical and functional deficits in the context of the above medical diagnosis(es)? Yes Addressing deficits in the following areas: balance, endurance, locomotion, strength, transferring, bowel/bladder control, bathing, dressing, feeding, grooming, toileting, cognition, speech and psychosocial support 5. Can the patient actively participate in an intensive therapy program of at least 3 hrs of therapy 5 days a week? Yes 6. The potential for patient to make measurable gains while on inpatient rehab is excellent 7. Anticipated functional outcomes upon discharge from inpatients are: supervision PT, supervision OT, supervision SLP 8. Estimated rehab length of stay to reach the above functional goals is: 10-14 days 9. Anticipated D/C setting: Home 10. Anticipated post D/C treatments: [facility] 11. Overall Rehab/Functional Prognosis: excellent  MD Signature: [name], MD, [facility] 12/19/2018

## 2018-12-19 NOTE — Progress Notes (Signed)
Physical Therapy Treatment Patient Details Name: Walter Grimes MRN: [id number] DOB: Aug 14, 1990 Today's Date: 12/19/2018    History of Present Illness Pt is a 28 y.o. M who presents after a seizure. CT 6/16 showing left parietal hemorrhage with intraventricular extension. Repeat CT 6/18 showing slight increase in left parietal parenchymal hemorrhage. Normal EEG.     PT Comments    Patient seen for mobility progression. Pt is making progress toward PT goals. Pt requires +2 assist for functional transfer training without use of AD or standing frame this session. Pt c/o dizziness throughout. Continue to recommend CIR for further skilled PT services to maximize independence and safety with mobility.     Follow Up Recommendations  CIR;Supervision/Assistance - 24 hour     Equipment Recommendations  Other (comment)(TBD)    Recommendations for Other Services       Precautions / Restrictions Precautions Precautions: Fall Precaution Comments: anxious    Mobility  Bed Mobility Overal bed mobility: Needs Assistance Bed Mobility: Supine to Sit     Supine to sit: Min assist     General bed mobility comments: Assist to bring hips to EOB.   Transfers Overall transfer level: Needs assistance Equipment used: 2 person hand held assist Transfers: Sit to/from [facility] Sit to Stand: +2 physical assistance;Mod assist Stand pivot transfers: +2 physical assistance;Max assist       General transfer comment: assistance required for R LE positioning prior to stand; assist to power up into standing and for balance/weight shifting for pivot EOB to recliner; pt with heavy R lateral lean   Ambulation/Gait                 Stairs             Wheelchair Mobility    Modified Rankin (Stroke Patients Only)       Balance Overall balance assessment: Needs assistance Sitting-balance support: Feet supported;Single extremity supported Sitting balance-Leahy  Scale: Fair     Standing balance support: Bilateral upper extremity supported Standing balance-Leahy Scale: Poor Standing balance comment: +2 max assist to maintain standing balance. Assist for right foot placement. Heavy right bias.                             Cognition Arousal/Alertness: Awake/alert Behavior During Therapy: Anxious;Flat affect Overall Cognitive Status: Impaired/Different from baseline Area of Impairment: Awareness;Following commands;Safety/judgement;Problem solving                   Current Attention Level: Sustained Memory: Decreased short-term memory Following Commands: Follows one step commands consistently;Follows multi-step commands with increased time Safety/Judgement: Decreased awareness of safety Awareness: Emergent Problem Solving: Decreased initiation;Difficulty sequencing;Requires verbal cues;Requires tactile cues General Comments: Pt repeatedly saying how dizzy he is today. He denies feeling dizzy yesterday although per PT note yesterday he also c/o dizziness.      Exercises General Exercises - Upper Extremity Elbow Flexion: AAROM;Right;5 reps;Seated(vcs from therapist) Elbow Extension: AAROM;Right;5 reps;Seated(VCs from therapist) Digit Composite Flexion: AROM;Right;5 reps Composite Extension: AROM;Right;5 reps    General Comments General comments (skin integrity, edema, etc.): Max cues for awareness of right UE.  Pt initially attempting to lift Right UE by pulling at thumb. Therapist educated him on re-positioning/moving right UE by grasping right wrist with left hand. Pt able to recall and demo how to safely move UE using left UE at end of session.  Therapist positioning right UE on pillow in recliner at end  of session.      Pertinent Vitals/Pain Pain Assessment: Faces Pain Score: 5  Faces Pain Scale: Hurts little more Pain Location: right LE Pain Descriptors / Indicators: Numbness;Tingling Pain Intervention(s): Monitored  during session;Repositioned    Home Living   Living Arrangements: (P) Spouse/significant other(lived for one month with GF, AMber and her 29 year old child) Available Help at Discharge: (P) Family;Available 24 hours/day(Mom and STep Dad can provide 24/7 supervision) Type of Home: (P) Apartment     Home Layout: (P) Two level;Bed/bath upstairs   Additional Comments: (P) no hh    Prior Function            PT Goals (current goals can now be found in the care plan section) Acute Rehab PT Goals Patient Stated Goal: "I want to walk again." Progress towards PT goals: Progressing toward goals    Frequency    Min 4X/week      PT Plan Current plan remains appropriate    Co-evaluation PT/OT/SLP Co-Evaluation/Treatment: Yes Reason for Co-Treatment: Complexity of the patient's impairments (multi-system involvement);For patient/therapist safety;To address functional/ADL transfers PT goals addressed during session: Mobility/safety with mobility;Balance OT goals addressed during session: ADL's and self-care;Strengthening/ROM(functional transfers)      AM-PAC PT "6 Clicks" Mobility   Outcome Measure  Help needed turning from your back to your side while in a flat bed without using bedrails?: A Lot Help needed moving from lying on your back to sitting on the side of a flat bed without using bedrails?: A Lot Help needed moving to and from a bed to a chair (including a wheelchair)?: A Lot Help needed standing up from a chair using your arms (e.g., wheelchair or bedside chair)?: A Lot Help needed to walk in hospital room?: A Lot Help needed climbing 3-5 steps with a railing? : Total 6 Click Score: 11    End of Session Equipment Utilized During Treatment: Gait belt Activity Tolerance: Patient tolerated treatment well Patient left: in chair;with call bell/phone within reach;with chair alarm set Nurse Communication: Mobility status PT Visit Diagnosis: Other abnormalities of gait and  mobility (R26.89);Hemiplegia and hemiparesis;Other symptoms and signs involving the nervous system (R29.898) Hemiplegia - Right/Left: Right Hemiplegia - dominant/non-dominant: Dominant Hemiplegia - caused by: Nontraumatic intracerebral hemorrhage     Time: [id number] PT Time Calculation (min) (ACUTE ONLY): 27 min  Charges:  $Gait Training: 8-22 mins                     Walter Grimes, PTA Acute Rehabilitation Services Pager: [phone] Office: [phone]     Walter EdouardKellyn R Cattleya Grimes 12/19/2018, 11:26 AM

## 2018-12-20 ENCOUNTER — Inpatient Hospital Stay (HOSPITAL_COMMUNITY): Payer: Self-pay | Admitting: Speech Pathology

## 2018-12-20 ENCOUNTER — Inpatient Hospital Stay (HOSPITAL_COMMUNITY): Payer: Self-pay | Admitting: Physical Therapy

## 2018-12-20 ENCOUNTER — Inpatient Hospital Stay (HOSPITAL_COMMUNITY): Payer: Self-pay | Admitting: Occupational Therapy

## 2018-12-20 DIAGNOSIS — I610 Nontraumatic intracerebral hemorrhage in hemisphere, subcortical: Secondary | ICD-10-CM

## 2018-12-20 NOTE — Progress Notes (Signed)
Orthopedic Tech Progress Note Patient Details:  Walter Grimes 25-Aug-1990 [id number]  Patient ID: [name], male   DOB: 10-14-1990, 28 y.o.   MRN: [id number]   [name] [facility]anger for right [name] boot 12/20/2018, 10:41 AM

## 2018-12-20 NOTE — Progress Notes (Signed)
[place] PHYSICAL MEDICINE & REHABILITATION PROGRESS NOTE   Subjective/Complaints: Had a restless night. "I have a lot on my mind"  ROS: Patient denies fever, rash, sore throat, blurred vision, nausea, vomiting, diarrhea, cough, shortness of breath or chest pain, joint or back pain,   Objective:   No results found. Recent Labs    12/19/18 1703  WBC 6.1  HGB 16.9  HCT 48.7  PLT 308   Recent Labs    12/19/18 1703  CREATININE 1.05    Intake/Output Summary (Last 24 hours) at 12/20/2018 0932 Last data filed at 12/20/2018 0540 Gross per 24 hour  Intake -  Output 750 ml  Net -750 ml     Physical Exam: Vital Signs Blood pressure 117/75, pulse 79, temperature 98.3 F (36.8 C), temperature source Oral, resp. rate 16, height 5\' 8"  (1.727 m), weight 74.5 kg, SpO2 98 %.  Constitutional: No distress . Vital signs reviewed. HEENT: EOMI, oral membranes moist Neck: supple Cardiovascular: RRR without murmur. No JVD    Respiratory: CTA Bilaterally without wheezes or rales. Normal effort    GI: BS +, non-tender, non-distended  Neurological: Alert, right inattention. Delays in initiating language, apraxic.  RUE: trace deltoid, bices, triceps, 2/5 grip. [name] 0-tr HE,KE and 0/5 ADF, PF. LUE and LLE grossly 4-5/5. Decreased sensation [name]>RUE. Able to sense gross touch throughout right side and to a lesser extent pain. Skin: Skin iswarmand dry. Numerous tattoos. Psychiatric:  Remains anxious   Assessment/Plan: 1. 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  Physiatrist is providing close team supervision and 24 hour management of active medical problems listed below.  Physiatrist and rehab team continue to assess barriers to discharge/monitor patient progress toward functional and medical goals  Care Tool:  Bathing              Bathing assist        Upper Body Dressing/Undressing Upper body dressing        Upper body assist      Lower Body Dressing/Undressing Lower body dressing            Lower body assist       Toileting Toileting    Toileting assist Assist for toileting: Moderate Assistance - Patient 50 - 74%     Transfers Chair/bed transfer  Transfers assist           Locomotion Ambulation   Ambulation assist              Walk 10 feet activity   Assist           Walk 50 feet activity   Assist           Walk 150 feet activity   Assist           Walk 10 feet on uneven surface  activity   Assist           Wheelchair     Assist               Wheelchair 50 feet with 2 turns activity    Assist            Wheelchair 150 feet activity     Assist           Medical Problem List and Plan: 1.Cognitive deficits with decreased functional mobilitysecondary toleft parietal frontal ICH with IVH -Patient is beginning CIR therapies today including PT, OT,  and SLP   -[name] [name] 2. Antithrombotics: -DVT/anticoagulation:Subcutaneous heparin initiated 12/12/2018 -antiplatelet therapy: N/A 3. Pain Management:Tramadol 100 mg every 6 hours and Fioricet as needed for headache 4. Mood:Xanax 0.25 mg 3 times daily  -team will need to provide regular reassurance  -neuropsych eval this week -antipsychotic agents: N/A 5. Neuropsych: This patientis notcapable of making decisions on hisown behalf. 6. Skin/Wound Care:Routine skin checks 7. Fluids/Electrolytes/Nutrition:encourage PO -labs ordered for Monday 8. Seizure disorder. Valproate 750 mg every 8 hours. Latest valproic acid level 57 9. History of alcohol tobacco and marijuana use. Provide counseling as appropriate    LOS: 1 days A FACE TO FACE EVALUATION WAS PERFORMED  Ranelle OysterZachary T Swartz 12/20/2018, 9:32 AM

## 2018-12-20 NOTE — Progress Notes (Signed)
Orthopedic Tech Progress Note Patient Details:  Walter Grimes 04/11/1991 [id number]  Ortho Devices Type of Ortho Device: [name] boot/shoe Ortho Device/Splint Location: right Ortho Device/Splint Interventions: Application   Post Interventions Patient Tolerated: Well Instructions Provided: Care of device   [place] Pink 12/20/2018, 1:06 PM

## 2018-12-20 NOTE — Evaluation (Signed)
Speech Language Pathology Assessment and Plan  Patient Details  Name: Walter Grimes MRN: [id number] Date of Birth: [birth year]  SLP Diagnosis: Cognitive Impairments;Speech and Language deficits  Rehab Potential: Excellent ELOS:      Today's Date: 12/20/2018 SLP Individual Time: 0900-1000 SLP Individual Time Calculation (min): 60 min   Problem List:  Patient Active Problem List   Diagnosis Date Noted  . ICH (intracerebral hemorrhage) ([place]) 12/09/2018   Past Medical History: No past medical history on file. Past Surgical History: No past surgical history on file.  Assessment / Plan / Recommendation Clinical Impression   Walter Grimes is a 28 year old right-handed male with documented history of alcohol and tobacco use on no prescription medications. Per chart review patient lives with girlfriendof 1 month andindependent prior to admission. 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 Plans to stay with his mother and stepfather on discharge with assistance as needed. Presented 12/08/2018 with altered mental status unresponsiveness and reported seizure by EMS lasting approximately 3 minutes. Urine drug screen positive marijuana. Cranial CT/MRI scan showed a 5.8 x 2.8 cm left parietal hemorrhage with intraventricular extension with 4 mm left to right midline shift. Normal MRA of the head. Initial EEG negative for seizure. Follow-up EEG 12/12/2018 and 12/14/2018 left focal slowing with few electrographic seizures. Patient loaded with Keppra and later changed to valproate 750 mg every 8 hours.. 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 Follow-up MRI 12/16/2018 unchanged left parietal hemorrhage with no enhancing lesions or tumor. Patient initially maintained on 3% for cerebral edema since resolved and discontinued. Echocardiogram with ejection fraction of 65%. His diet has been advanced to regular. Maintained on Fioricet for  bouts of headaches. Therapy evaluations completed and patient was admitted for a comprehensive rehab program on 12/19/18.   Pt continues to present with mild to moderate cognitive linguistic impairment c/b decrased selective attention, flat affect, difficulty with word finding, decreased ability to complete semi-complex problems and decreased insight into cognitive deficits. Pt states taht he stoped attending school in the 9th grade but was living independently prior to hemorrhage. Pt appears depressed but agreeable to ST POC (mostly voiced agreement surrounding return of physical function). Skilled St is required to target the above mentioned deficits, increase functional independence and reduce caregiver burden. Anticipate that pt will require supervision at discharge with follow up [place].    Skilled Therapeutic Interventions          SLP facilitated session by providing skilled assessment of cognitive linguistic abilities, see above. Education also presented on POC and prognosis for continued recovery. All questions naswered to pt's satisfaction.     SLP Assessment  Patient will need skilled Speech Lanaguage Pathology Services during CIR admission    Recommendations  SLP Diet Recommendations: Age appropriate regular solids;Thin Liquid Administration via: Cup;Straw Medication Administration: Whole meds with liquid Supervision: Patient able to self feed Compensations: Minimize environmental distractions;Slow rate;Small sips/bites Recommendations for Other Services: Neuropsych consult Patient destination: Home Follow up Recommendations: Home Health SLP;Outpatient SLP;24 hour supervision/assistance Equipment Recommended: None recommended by SLP    SLP Frequency 3 to 5 out of 7 days   SLP Duration  SLP Intensity  SLP Treatment/Interventions  10 to 14 days  Minumum of 1-2 x/day, 30 to 90 minutes  Patient/family education;Medication managment;Therapeutic Activities;Speech/Language  facilitation    Pain    Prior Functioning Cognitive/Linguistic Baseline: Within functional limits Type of Home: Apartment  Lives With: Significant other Available Help at Discharge: Family;Available 24 hours/day Education:  didn't finish the 12th grade Vocation: Full time employment  Short Term Goals: Week 1: SLP Short Term Goal 1 (Week 1): Pt will utilize word finding strategies to cnvey semi-complex information with supervision cues. SLP Short Term Goal 2 (Week 1): Pt will complete semi-complex problem solving tasks with Min A cues. SLP Short Term Goal 3 (Week 1): Pt will demonstrate selective attention in moderately distracting environment for ~ 45 minutes with supervision cues. SLP Short Term Goal 4 (Week 1): Pt will demonstrate intellectual awareness for cognitive deficit in 7 out of 10 opportunities with Mod A cues.  Refer to Care Plan for Long Term Goals  Recommendations for other services: Neuropsych  Discharge Criteria: Patient will be discharged from SLP if patient refuses treatment 3 consecutive times without medical reason, if treatment goals not met, if there is a change in medical status, if patient makes no progress towards goals or if patient is discharged from hospital.  The above assessment, treatment plan, treatment alternatives and goals were discussed and mutually agreed upon: by patient  Walter Grimes 12/20/2018, 11:15 AM

## 2018-12-20 NOTE — Evaluation (Signed)
Occupational Therapy Assessment and Plan  Patient Details  Name: Walter Grimes MRN: [id number] Date of Birth: 04/03/1991  OT Diagnosis: abnormal posture, cognitive deficits, hemiplegia affecting dominant side and muscle weakness (generalized) Rehab Potential: Rehab Potential (ACUTE ONLY): Excellent ELOS: 14-16 days   Today's Date: 12/20/2018 OT Individual Time: 6644-0347 OT Individual Time Calculation (min): 61 min     Problem List:  Patient Active Problem List   Diagnosis Date Noted  . ICH (intracerebral hemorrhage) ([place]) 12/09/2018    Past Medical History: No past medical history on file. Past Surgical History: No past surgical history on file.  Assessment & Plan Clinical Impression: Patient is a 28 y.o. year old male with recent admission to the hospital on 12/08/2018 with altered mental status unresponsiveness and reported seizure by EMS lasting approximately 3 minutes. Urine drug screen positive marijuana. Cranial CT/MRI scan showed a 5.8 x 2.8 cm left parietal hemorrhage with intraventricular extension with 4 mm left to right midline shift. Normal MRA of the head. Initial EEG negative for seizure. Follow-up EEG 12/12/2018 and 12/14/2018 left focal slowing with few electrographic seizures.  Patient transferred to CIR on 12/19/2018 .    Patient currently requires max with basic self-care skills secondary to muscle weakness, impaired timing and sequencing, unbalanced muscle activation, decreased coordination and decreased motor planning, decreased attention to right, right side neglect and decreased motor planning, decreased awareness, decreased safety awareness and decreased memory and decreased sitting balance, decreased standing balance, decreased postural control, hemiplegia and decreased balance strategies.  Prior to hospitalization, patient could complete ADLs with independent .  Patient will benefit from skilled intervention to decrease level of assist with basic self-care  skills and increase independence with basic self-care skills prior to discharge home with care partner.  Anticipate patient will require 24 hour supervision and follow up home health.  OT - End of Session Activity Tolerance: Tolerates 30+ min activity with multiple rests Endurance Deficit: Yes OT Assessment Rehab Potential (ACUTE ONLY): Excellent OT Patient demonstrates impairments in the following area(s): Balance;Pain;Perception;Safety;Cognition;Sensory;Endurance;Motor OT Basic ADL's Functional Problem(s): Toileting;Dressing;Bathing;Grooming;Eating OT Transfers Functional Problem(s): Toilet;Tub/Shower OT Additional Impairment(s): Fuctional Use of Upper Extremity OT Plan OT Intensity: Minimum of 1-2 x/day, 45 to 90 minutes OT Frequency: 5 out of 7 days OT Duration/Estimated Length of Stay: 14-16 days OT Treatment/Interventions: Balance/vestibular training;Discharge planning;Functional electrical stimulation;Pain management;Self Care/advanced ADL retraining;Therapeutic Activities;UE/LE Coordination activities;Visual/perceptual remediation/compensation;Therapeutic Exercise;Patient/family education;Functional mobility training;Disease mangement/prevention;Cognitive remediation/compensation;Community reintegration;DME/adaptive equipment instruction;Neuromuscular re-education;Psychosocial support;Splinting/orthotics;UE/LE Strength taining/ROM;Wheelchair propulsion/positioning OT Self Feeding Anticipated Outcome(s): modified independent OT Basic Self-Care Anticipated Outcome(s): supervision to min assist OT Toileting Anticipated Outcome(s): supervision OT Bathroom Transfers Anticipated Outcome(s): min assist OT Recommendation Recommendations for Other Services: Neuropsych consult Patient destination: Home Follow Up Recommendations: Home health OT;24 hour supervision/assistance Equipment Recommended: 3 in 1 bedside comode;Tub/shower bench   Skilled Therapeutic Intervention Pt completed selfcare  retraining sit to stand at the EOB this session.  Min assist for dynamic sitting balance with LOB to the right at times.  Pt maintained head gaze forward most of the time and slight left at times, not scanning right of midline very often when engaged in conversation with therapist.  He also maintained flat affect throughout and appeared discouraged about his situation.  Therapist offering encouragement and expectations of progress throughout session.  He completed UB bathing with mod assist hand over hand with the RUE to wash the left arm, however he does exhibit Brunnstrum stage II-III movement in the right and and stage V in the hand.  Mod  assist for sit to stand with washing buttocks and pulling pants over hips.  Max assist for completion of LB dressing overall with mod facilitation to cross the [name] over the left knee and maintain for washing of the foot and donning of clothing.  Total assist to ambulate 5-6 steps to the bedside recliner without assistive device.  Max facilitation needed to stabilization of the right knee as well as for advancing it with gait.  Finished session with pt in the recliner at bedside with call button and phone in reach and RUE supported on pillows.Chair alarm in place with discussion of supervision to min assist goals for OT.  Pt voicing understanding and eager to get back to as normal as possible.    OT Evaluation Precautions/Restrictions  Precautions Precautions: Fall Restrictions Weight Bearing Restrictions: No   Pain Pain Assessment Pain Scale: Faces Faces Pain Scale: Hurts a little bit Pain Type: Acute pain Pain Location: Neck Pain Orientation: Lower Pain Descriptors / Indicators: Aching Pain Onset: On-going Pain Intervention(s): Emotional support Home Living/Prior Functioning Home Living Family/patient expects to be discharged to:: Private residence Living Arrangements: Spouse/significant other Available Help at Discharge: Available 24 hours/day,  Family Type of Home: House Home Access: Stairs to enter Home Layout: One level Bathroom Shower/Tub: Public librarian, Architectural technologist: Programmer, systems: Yes  Lives With: Significant other IADL History Homemaking Responsibilities: Yes Current License: Yes Mode of TransportationOccupational psychologist Education: didn't finish the 12th grade Occupation: Full time employment Leisure and Hobbies: 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[employer], chartered Prior Function Level of Independence: Independent with basic ADLs Driving: Yes Vocation: Full time employment 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" ADL ADL Eating: Set up Where Assessed-Eating: Wheelchair Grooming: Minimal assistance Where Assessed-Grooming: Edge of bed Upper Body Bathing: Minimal assistance Where Assessed-Upper Body Bathing: Edge of bed Lower Body Bathing: Maximal assistance Where Assessed-Lower Body Bathing: Edge of bed Upper Body Dressing: Moderate assistance Where Assessed-Upper Body Dressing: Edge of bed Lower Body Dressing: Maximal assistance Where Assessed-Lower Body Dressing: Edge of bed Toileting: Maximal assistance Where Assessed-Toileting: Bedside Commode Toilet Transfer: Maximal assistance Toilet Transfer Method: Stand pivot Science writer: Bedside commode Vision Baseline Vision/History: No visual deficits Patient Visual Report: Blurring of vision Vision Assessment?: Vision impaired- to be further tested in functional context Additional Comments: Will assess vision further during treatment sessions. Perception  Perception: Impaired Inattention/Neglect: Does not attend to right visual field;Does not attend to right side of body Praxis Praxis: Impaired Praxis Impairment Details: Ideomotor Praxis-Other Comments: Pt with more distal movement in the RUE compared to proximal. Cognition Overall Cognitive Status: Impaired/Different from baseline Arousal/Alertness: Awake/alert Orientation Level:  Person;Place;Situation Person: Oriented Place: Oriented Situation: Oriented Year: 2020 Month: June Day of Week: Correct Memory: Impaired Memory Impairment: Storage deficit;Retrieval deficit Immediate Memory Recall: Sock;Blue;Bed Memory Recall Sock: Without Cue Memory Recall Blue: Without Cue Memory Recall Bed: With Cue Attention: Sustained;Selective Focused Attention: Appears intact Sustained Attention: Appears intact Selective Attention: Impaired Selective Attention Impairment: Functional basic Awareness: Impaired Awareness Impairment: Intellectual impairment Problem Solving Impairment: Verbal complex;Functional complex Executive Function: Reasoning;Sequencing;Organizing Reasoning: Impaired Reasoning Impairment: Verbal basic;Functional basic Sequencing: Impaired Sequencing Impairment: Verbal basic;Functional basic Organizing: Impaired Organizing Impairment: Verbal basic;Functional basic Safety/Judgment: Impaired Comments: d/t decreased insight into cognitive deficits Sensation Sensation Light Touch: Appears Intact(grossly intact in the RUE) Hot/Cold: Not tested Proprioception: Impaired Detail Proprioception Impaired Details: Impaired RUE(greater difficulty distally compared to proximaly in the RUE.) Stereognosis: Not tested Coordination Gross Motor Movements are Fluid and Coordinated: No Fine Motor Movements are Fluid  and Coordinated: No Coordination and Movement Description: Pt demonstrates Brunnstrum stage II-III movement in the RUE with stage V in the hand.  He needs max hand over hand to integrate into bathing and dressing tasks. Motor  Motor Motor: Hemiplegia Motor - Skilled Clinical Observations: moderate RUE and [name] hemiparesis Mobility  Bed Mobility Bed Mobility: Supine to Sit Supine to Sit: Moderate Assistance - Patient 50-74% Transfers Sit to Stand: Moderate Assistance - Patient 50-74% Stand to Sit: Moderate Assistance - Patient 50-74%  Trunk/Postural  Assessment  Cervical Assessment Cervical Assessment: Exceptions to WFL(head slightly turned left of midline) Thoracic Assessment Thoracic Assessment: Within Functional Limits Lumbar Assessment Lumbar Assessment: Exceptions to WFL(posterior pelvic tilt) Postural Control Postural Control: Deficits on evaluation Protective Responses: Delayed with LOB to the right during ADL tasks.  Balance Balance Balance Assessed: Yes Static Sitting Balance Static Sitting - Balance Support: Feet supported Static Sitting - Level of Assistance: 5: Stand by assistance Dynamic Sitting Balance Dynamic Sitting - Balance Support: Feet supported;During functional activity Dynamic Sitting - Level of Assistance: 4: Min assist Static Standing Balance Static Standing - Balance Support: During functional activity Static Standing - Level of Assistance: 2: Max assist Dynamic Standing Balance Dynamic Standing - Balance Support: During functional activity Dynamic Standing - Level of Assistance: 2: Max assist Extremity/Trunk Assessment RUE Assessment RUE Assessment: Exceptions to [facility] Passive Range of Motion (PROM) Comments: WFLs General Strength Comments: Pt with Brunnstrum stage II-III in the arm and stage V in the hand.  Full gross grasp and release present in the hand with opposition to the fourth digit but not quite the 5th.  Shoulder synergy present with attempted arm movement as well as slight biceps and triceps, but difficult for pt to activate.  Max hand over hand for washing the RUE. LUE Assessment LUE Assessment: Within Functional Limits     Refer to Care Plan for Long Term Goals  Recommendations for other services: Neuropsych   Discharge Criteria: Patient will be discharged from OT if patient refuses treatment 3 consecutive times without medical reason, if treatment goals not met, if there is a change in medical status, if patient makes no progress towards goals or if patient is discharged from  hospital.  The above assessment, treatment plan, treatment alternatives and goals were discussed and mutually agreed upon: by patient  Yeraldine Forney OTR/L 12/20/2018, 12:59 PM

## 2018-12-20 NOTE — Evaluation (Addendum)
Physical Therapy Assessment and Plan  Patient Details  Name: Walter Grimes MRN: [id number] Date of Birth: 1990/10/07  PT Diagnosis: Abnormal posture, Abnormality of gait, Difficulty walking, Hemiparesis dominant, Hypertonia, Impaired cognition, Impaired sensation, Muscle weakness, Pain in R LE and Paralysis Rehab Potential: Good ELOS: 14-16 days   Today's Date: 12/20/2018 PT Individual Time: 1305-1406 PT Individual Time Calculation (min): 61 min    Problem List:  Patient Active Problem List   Diagnosis Date Noted  . ICH (intracerebral hemorrhage) ([place]) 12/09/2018    Past Medical History: No past medical history on file. Past Surgical History: No past surgical history on file.  Assessment & Plan Clinical Impression: Patient is a 28 y.o. year old right-handed male with documented history of alcohol and tobacco use on no prescription medications. Per chart review patient lives with girlfriendof 1 month andindependent prior to admission. 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 Plans to stay with his mother and stepfather on discharge with assistance as needed. Presented 12/08/2018 with altered mental status unresponsiveness and reported seizure by EMS lasting approximately 3 minutes. Urine drug screen positive marijuana. Cranial CT/MRI scan showed a 5.8 x 2.8 cm left parietal hemorrhage with intraventricular extension with 4 mm left to right midline shift. Normal MRA of the head. Initial EEG negative for seizure. Follow-up EEG 12/12/2018 and 12/14/2018 left focal slowing with few electrographic seizures. Patient loaded with Keppra and later changed to valproate 750 mg every 8 hours.. 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 Follow-up MRI 12/16/2018 unchanged left parietal hemorrhage with no enhancing lesions or tumor. Patient initially maintained on 3% for cerebral edema since resolved and discontinued. Echocardiogram with  ejection fraction of 65%. His diet has been advanced to regular. Maintained on Fioricet for bouts of headaches. Therapy evaluations completed and patient was admitted for a comprehensive rehab program.  Patient transferred to CIR on 12/19/2018 .   Patient currently requires max with mobility secondary to muscle weakness and muscle paralysis, decreased cardiorespiratoy endurance, impaired timing and sequencing, abnormal tone, unbalanced muscle activation, motor apraxia, decreased coordination and decreased motor planning,  , decreased midline orientation, decreased attention to right and decreased motor planning, decreased attention, decreased awareness, decreased problem solving, decreased safety awareness and decreased memory and decreased sitting balance, decreased standing balance, decreased postural control and decreased balance strategies.  Prior to hospitalization, patient was independent  with mobility and lived with Significant other in a House home.  Home access is  Stairs to enter.  Patient will benefit from skilled PT intervention to maximize safe functional mobility, minimize fall risk and decrease caregiver burden for planned discharge home with 24 hour assist.  Anticipate patient will benefit from follow up [facility] at discharge.  PT - End of Session Activity Tolerance: Tolerates 30+ min activity with multiple rests Endurance Deficit: Yes Endurance Deficit Description: requires frequent rest breaks and reports being fatigued multiple times during session PT Assessment Rehab Potential (ACUTE/IP ONLY): Good PT Barriers to Discharge: Decreased caregiver support;Inaccessible home environment;Home environment access/layout PT Patient demonstrates impairments in the following area(s): Balance;Safety;Sensory;Motor;Endurance;Nutrition;Pain;Perception;Behavior PT Transfers Functional Problem(s): Bed Mobility;Bed to Chair;Car;Furniture;Floor PT Locomotion Functional Problem(s): Ambulation;Wheelchair  Mobility;Stairs PT Plan PT Intensity: Minimum of 1-2 x/day ,45 to 90 minutes PT Frequency: 5 out of 7 days PT Duration Estimated Length of Stay: 14-16 days PT Treatment/Interventions: Ambulation/gait training;Community reintegration;DME/adaptive equipment instruction;Neuromuscular re-education;Psychosocial support;Stair training;UE/LE Strength taining/ROM;Wheelchair propulsion/positioning;UE/LE Coordination activities;Therapeutic Activities;Skin care/wound management;Pain management;Functional electrical stimulation;Discharge planning;Balance/vestibular training;Cognitive remediation/compensation;Disease management/prevention;Functional mobility training;Patient/family education;Splinting/orthotics;Therapeutic Exercise;Visual/perceptual  remediation/compensation PT Transfers Anticipated Outcome(s): min assist transfers PT Locomotion Anticipated Outcome(s): min assist ambulating with LRAD PT Recommendation Recommendations for Other Services: Therapeutic Recreation consult;Neuropsych consult Therapeutic Recreation Interventions: Stress management Follow Up Recommendations: Home health PT;24 hour supervision/assistance Patient destination: Home Equipment Recommended: To be determined  Skilled Therapeutic Intervention Evaluation completed (see details above and below) with education on PT POC and goals and individual treatment initiated with focus on bed mobility, transfers, initiating gait, as well as education regarding daily therapy schedule, weekly team meetings, purpose of PT evaluation, and other CIR information. Pt received reclined, asleep in recliner easily awakens to name and agreeable to therapy session but reports he is very tired. Pt with flat affect and minimal conversation that improved slightly during session. Sit<>stand with mod assist for lifting/lowering and blocking R knee with pt demonstrating ability to use L hemibody strength to compensate for impaired R LE strength. Therapist  provided pt with w/c and w/c cushion for improved pressure relief and for increased OOB activity tolerance. L stand pivot transfer recliner>w/c, no AD, with mod/max assist for balance and blocking R knee.  Transported to gym in w/c. Pt reports onset of significant dizziness and describes having horizontal double vision - pt reports he believes this dizziness is associated with the medications he is taking. Ambulated ~30f via 3 Musketeer style with mod assist of 2 - pt able to initiate swing phase of R LE but has scissoring requiring assist for proper placement and then requires assist during stance phase to prevent R knee buckling resulting in knee hyperextension. Transported back to room in w/c and pt requesting to return to bed due to being so tired. Squat pivot transfer w/c>EOB with mod assist for pivoting hips and cuing for sequencing. Seated EOB pt relies heavily on L UE support to maintain balance with supervision/min assist. Sit>supine with min assist for R LE management. Pt continues to report some dizziness with vitals assessed: BP 126/78 (MAP 93). Pt left supine in bed with needs in reach and bed alarm on.  PT Evaluation Precautions/Restrictions Precautions Precautions: Fall Restrictions Weight Bearing Restrictions: No Pain Reported R LE pain during initial stance when promoting R lateral weight shift and increased R LE weight bearing - provided seated rest break and no other reports of pain during session. Home Living/Prior Functioning Home Living Available Help at Discharge: Available 24 hours/day;Family(per chart review pt's mom is disabled but can provide 24hr supervision/min assist) Type of Home: House Home Access: Stairs to enter Entrance Stairs-Number of Steps: 2 Entrance Stairs-Rails: Right;Left;Can reach both  Lives With: Family(planning to D/C with mom, stepdad, and nephews) Prior Function Level of Independence: Independent with homemaking with ambulation;Independent with  gait;Independent with transfers  Able to Take Stairs?: Yes Driving: Yes Vision/Perception  Perception Perception: Impaired Inattention/Neglect: Does not attend to right visual field;Does not attend to right side of body Praxis Praxis: Impaired Praxis Impairment Details: Motor planning  Cognition Overall Cognitive Status: Impaired/Different from baseline Orientation Level: Oriented to person;Oriented to place;Oriented to situation;Disoriented to time(states that it is "February" able to recall year is 2020) Attention: Focused;Sustained Focused Attention: Appears intact Sustained Attention: Appears intact Awareness: Impaired Safety/Judgment: Impaired Sensation Sensation Light Touch: Impaired Detail Central sensation comments: significantly impaired in R LE Light Touch Impaired Details: Impaired [name] Hot/Cold: Not tested Proprioception: Impaired Detail Proprioception Impaired Details: Impaired [name] Stereognosis: Not tested Coordination Gross Motor Movements are Fluid and Coordinated: No Coordination and Movement Description: impaired gross motor movements due to significant R LE strength  and sensation impairments as well as inattention Heel Shin Test: impaired due to strength impairments Motor  Motor Motor: Hemiplegia;Other (comment);Motor apraxia;Abnormal tone Motor - Skilled Clinical Observations: R hemibody paresis and increased R LE tone  Mobility Bed Mobility Bed Mobility: Sit to Supine Sit to Supine: Minimal Assistance - Patient > 75% Transfers Transfers: Sit to Stand;Stand to Sit;Stand Pivot Transfers Sit to Stand: Moderate Assistance - Patient 50-74% Stand to Sit: Moderate Assistance - Patient 50-74% Stand Pivot Transfers: Maximal Assistance - Patient 25 - 49% Stand Pivot Transfer Details: Tactile cues for sequencing;Tactile cues for initiation;Verbal cues for technique;Verbal cues for sequencing;Verbal cues for gait pattern;Verbal cues for precautions/safety Transfer  (Assistive device): None Locomotion  Gait Ambulation: Yes Gait Assistance: 2 Helpers(3 Musketeers) Gait Distance (Feet): 9 Feet Assistive device: 2 person hand held assist(3 Musketeers) Gait Assistance Details: Tactile cues for initiation;Tactile cues for sequencing;Tactile cues for weight shifting;Tactile cues for weight beaing;Manual facilitation for weight shifting;Manual facilitation for weight bearing;Manual facilitation for placement;Verbal cues for gait pattern;Verbal cues for technique;Verbal cues for sequencing Gait Gait: Yes Gait Pattern: Impaired Gait Pattern: Decreased step length - right;Decreased step length - left;Decreased stance time - right;Decreased stride length;Decreased dorsiflexion - right;Poor foot clearance - right(R LE scissoring due to tone) Stairs / Additional Locomotion Stairs: No Wheelchair Mobility Wheelchair Mobility: No  Trunk/Postural Assessment  Cervical Assessment Cervical Assessment: Exceptions to WFL(turned L of midline) Thoracic Assessment Thoracic Assessment: Within Functional Limits Lumbar Assessment Lumbar Assessment: Within Functional Limits Postural Control Postural Control: Deficits on evaluation Righting Reactions: delayed and inadequate Protective Responses: delayed and inadequate Postural Limitations: decreased due to R hemibody paresis  Balance Balance Balance Assessed: Yes Static Sitting Balance Static Sitting - Balance Support: Feet supported;No upper extremity supported Static Sitting - Level of Assistance: 3: Mod assist(without L UE support pt has posterior and R lateral LOB) Static Standing Balance Static Standing - Balance Support: During functional activity;No upper extremity supported Static Standing - Level of Assistance: 2: Max assist Dynamic Standing Balance Dynamic Standing - Balance Support: During functional activity Dynamic Standing - Level of Assistance: 1: +2 Total assist Extremity Assessment  [name]  Assessment [name] Assessment: Exceptions to [facility] Passive Range of Motion (PROM) Comments: WFL [name] Strength Right Hip Flexion: 2-/5 Right Knee Flexion: 1/5 Right Knee Extension: 2-/5 Right Ankle Dorsiflexion: 0/5 Right Ankle Plantar Flexion: 0/5 LLE Assessment LLE Assessment: Within Functional Limits General Strength Comments: Grossly 4+/5 to 5/5 throughout    Refer to Care Plan for Long Term Goals  Recommendations for other services: Neuropsych and Therapeutic Recreation  Stress management  Discharge Criteria: Patient will be discharged from PT if patient refuses treatment 3 consecutive times without medical reason, if treatment goals not met, if there is a change in medical status, if patient makes no progress towards goals or if patient is discharged from hospital.  The above assessment, treatment plan, treatment alternatives and goals were discussed and mutually agreed upon: by patient  Tawana Scale, PT, DPT 12/20/2018, 1:04 PM

## 2018-12-21 MED ORDER — TRAZODONE HCL 50 MG PO TABS
50.0000 mg | ORAL_TABLET | Freq: Every day | ORAL | Status: DC
  Administered 2018-12-21 – 2019-01-06 (×13): 50 mg via ORAL
  Filled 2018-12-21 (×15): qty 1

## 2018-12-21 MED ORDER — TRAMADOL HCL 50 MG PO TABS: 50.0000 mg | ORAL_TABLET | Freq: Four times a day (QID) | ORAL | Status: DC | PRN

## 2018-12-21 MED ORDER — TRAMADOL HCL 50 MG PO TABS: 50.0000 mg | ORAL_TABLET | Freq: Four times a day (QID) | ORAL | Status: DC

## 2018-12-21 NOTE — Progress Notes (Signed)
[facility] PHYSICAL MEDICINE & REHABILITATION PROGRESS NOTE   Subjective/Complaints: RN reports patient anxious again last night. "I feel high from medications".   ROS: Patient denies fever, rash, sore throat, blurred vision, nausea, vomiting, diarrhea, cough, shortness of breath or chest pain, joint or back pain,     Objective:   No results found. Recent Labs    12/19/18 1703  WBC 6.1  HGB 16.9  HCT 48.7  PLT 308   Recent Labs    12/19/18 1703  CREATININE 1.05    Intake/Output Summary (Last 24 hours) at 12/21/2018 0918 Last data filed at 12/21/2018 0351 Gross per 24 hour  Intake 462 ml  Output 550 ml  Net -88 ml     Physical Exam: Vital Signs Blood pressure 135/71, pulse 94, temperature 98.1 F (36.7 C), resp. rate 18, height 5\' 8"  (1.727 m), weight 74.5 kg, SpO2 100 %.  Constitutional: No distress . Vital signs reviewed. HEENT: EOMI, oral membranes moist Neck: supple Cardiovascular: RRR without murmur. No JVD    Respiratory: CTA Bilaterally without wheezes or rales. Normal effort    GI: BS +, non-tender, non-distended  Neurological: Alert, right inattention, still some delays in speech, processing.   RUE: trace to 1/5 deltoid, bices, triceps; 2/5 grip. [name] 0-tr HE,KE and 0/5 ADF, PF. LUE and LLE grossly 4-5/5. Decreased sensation [name]>RUE. Able to sense gross touch throughout right side and to a lesser extent pain. Skin: Skin iswarmand dry. Numerous tattoos. Psychiatric:  Remains sl anxious, restless   Assessment/Plan: 1. 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  Physiatrist is providing close team supervision and 24 hour management of active medical problems listed below.  Physiatrist and rehab team continue to assess barriers to discharge/monitor patient progress toward functional and medical goals  Care Tool:  Bathing     Body parts bathed by patient: Right arm, Chest, Abdomen, Right upper leg, Left upper leg, Left lower leg, Face   Body parts bathed by helper: Front perineal area, Buttocks, Left arm, Right lower leg     Bathing assist Assist Level: Maximal Assistance - Patient 24 - 49%     Upper Body Dressing/Undressing Upper body dressing   What is the patient wearing?: Hospital gown only(Hospital scrub)    Upper body assist Assist Level: Moderate Assistance - Patient 50 - 74%    Lower Body Dressing/Undressing Lower body dressing      What is the patient wearing?: Hospital gown only     Lower body assist Assist for lower body dressing: Moderate Assistance - Patient 50 - 74%(Hospital scrub)     Toileting Toileting    Toileting assist Assist for toileting: Independent with assistive device Assistive Device Comment: urinal   Transfers Chair/bed transfer  Transfers assist     Chair/bed transfer assist level: Maximal Assistance - Patient 25 - 49%     Locomotion Ambulation   Ambulation assist      Assist level: 2 helpers Assistive device: No Device Max distance: 719ft   Walk 10 feet activity   Assist  Walk 10 feet activity did not occur: Safety/medical concerns        Walk 50 feet activity   Assist Walk 50 feet with 2 turns activity did not occur: Safety/medical concerns         Walk 150 feet activity   Assist Walk 150 feet activity did not occur: Safety/medical concerns  Walk 10 feet on uneven surface  activity   Assist Walk 10 feet on uneven surfaces activity did not occur: Safety/medical concerns         Wheelchair     Assist Will patient use wheelchair at discharge?: (TBD)   Wheelchair activity did not occur: Safety/medical concerns(unable due to pt fatigue)         Wheelchair 50 feet with 2 turns activity    Assist            Wheelchair 150 feet activity     Assist           Medical Problem List and  Plan: 1.Cognitive deficits with decreased functional mobilitysecondary toleft parietal frontal ICH with IVH -Patient is beginning CIR therapies today including PT, OT, and SLP   -[name] [name] 2. Antithrombotics: -DVT/anticoagulation:Subcutaneous heparin initiated 12/12/2018 -antiplatelet therapy: N/A 3. Pain Management:change Tramadol to 50mg  q 6 hr prn and Fioricet as needed for headache 4. Mood:Xanax 0.25 mg 3 times daily  -team will need to provide regular reassurance  -neuropsych eval this week -antipsychotic agents: N/A  -schedule trazodone 50mg  qhs for sleep 5. Neuropsych: This patientis notcapable of making decisions on hisown behalf. 6. Skin/Wound Care:Routine skin checks 7. Fluids/Electrolytes/Nutrition:encourage PO -labs ordered for Monday 8. Seizure disorder. Valproate 750 mg every 8 hours. Latest valproic acid level 57  -discussed with him that he will need to continue on VPA given his seizure risk  -hopefully the reduction and change of tramadol to prn will help his head clear 9. History of alcohol tobacco and marijuana use. Provide counseling as appropriate    LOS: 2 days A FACE TO FACE EVALUATION WAS PERFORMED  [name] 12/21/2018, 9:18 AM

## 2018-12-22 ENCOUNTER — Inpatient Hospital Stay (HOSPITAL_COMMUNITY): Payer: Self-pay | Admitting: Occupational Therapy

## 2018-12-22 ENCOUNTER — Inpatient Hospital Stay (HOSPITAL_COMMUNITY): Payer: Self-pay | Admitting: Physical Therapy

## 2018-12-22 ENCOUNTER — Inpatient Hospital Stay (HOSPITAL_COMMUNITY): Payer: Self-pay

## 2018-12-22 DIAGNOSIS — R740 Nonspecific elevation of levels of transaminase and lactic acid dehydrogenase [LDH]: Secondary | ICD-10-CM

## 2018-12-22 DIAGNOSIS — F411 Generalized anxiety disorder: Secondary | ICD-10-CM

## 2018-12-22 DIAGNOSIS — G479 Sleep disorder, unspecified: Secondary | ICD-10-CM

## 2018-12-22 DIAGNOSIS — R569 Unspecified convulsions: Secondary | ICD-10-CM

## 2018-12-22 DIAGNOSIS — R7401 Elevation of levels of liver transaminase levels: Secondary | ICD-10-CM

## 2018-12-22 LAB — COMPREHENSIVE METABOLIC PANEL
ALT: 80 U/L — ABNORMAL HIGH (ref 0–44)
AST: 60 U/L — ABNORMAL HIGH (ref 15–41)
Albumin: 3.7 g/dL (ref 3.5–5.0)
Alkaline Phosphatase: 86 U/L (ref 38–126)
Anion gap: 11 (ref 5–15)
BUN: 19 mg/dL (ref 6–20)
CO2: 26 mmol/L (ref 22–32)
Calcium: 9.7 mg/dL (ref 8.9–10.3)
Chloride: 99 mmol/L (ref 98–111)
Creatinine, Ser: 0.97 mg/dL (ref 0.61–1.24)
GFR calc Af Amer: >60 mL/min (ref >60)
GFR calc non Af Amer: >60 mL/min (ref >60)
Glucose, Bld: 100 mg/dL — ABNORMAL HIGH (ref 70–99)
Potassium: 3.8 mmol/L (ref 3.5–5.1)
Sodium: 136 mmol/L (ref 135–145)
Total Bilirubin: 0.5 mg/dL (ref 0.3–1.2)
Total Protein: 7.3 g/dL (ref 6.5–8.1)

## 2018-12-22 LAB — CBC WITH DIFFERENTIAL/PLATELET
Abs Immature Granulocytes: 0.02 10*3/uL (ref 0.00–0.07)
Basophils Absolute: 0 10*3/uL (ref 0.0–0.1)
Basophils Relative: 1 %
Eosinophils Absolute: 0.1 10*3/uL (ref 0.0–0.5)
Eosinophils Relative: 1 %
HCT: 44.1 % (ref 39.0–52.0)
Hemoglobin: 15.4 g/dL (ref 13.0–17.0)
Immature Granulocytes: 0 %
Lymphocytes Relative: 47 %
Lymphs Abs: 3 10*3/uL (ref 0.7–4.0)
MCH: 30.9 pg (ref 26.0–34.0)
MCHC: 34.9 g/dL (ref 30.0–36.0)
MCV: 88.6 fL (ref 80.0–100.0)
Monocytes Absolute: 0.9 10*3/uL (ref 0.1–1.0)
Monocytes Relative: 15 %
Neutro Abs: 2.3 10*3/uL (ref 1.7–7.7)
Neutrophils Relative %: 36 %
Platelets: 323 10*3/uL (ref 150–400)
RBC: 4.98 MIL/uL (ref 4.22–5.81)
RDW: 11.6 % (ref 11.5–15.5)
WBC: 6.3 10*3/uL (ref 4.0–10.5)
nRBC: 0 % (ref 0.0–0.2)

## 2018-12-22 MED ORDER — ACETAMINOPHEN 325 MG PO TABS
650.0000 mg | ORAL_TABLET | Freq: Four times a day (QID) | ORAL | Status: DC
  Administered 2018-12-22 – 2019-01-06 (×59): 650 mg via ORAL
  Filled 2018-12-22 (×60): qty 2

## 2018-12-22 MED ORDER — ACETAMINOPHEN 160 MG/5ML PO SOLN: 650.0000 mg | Freq: Four times a day (QID) | ORAL | Status: DC

## 2018-12-22 MED ORDER — ACETAMINOPHEN 650 MG RE SUPP
650.0000 mg | Freq: Four times a day (QID) | RECTAL | Status: DC
  Filled 2018-12-22: qty 1

## 2018-12-22 NOTE — Progress Notes (Signed)
Patient refused Xanax and Depakene said the meds make him sleepy, he said he did very well with out the meds.

## 2018-12-22 NOTE — Progress Notes (Signed)
Social Work Assessment and Plan   Patient Details  Name: Walter Grimes MRN: [id number] Date of Birth: 01/23/1991  Today's Date: 12/22/2018  Problem List:  Patient Active Problem List   Diagnosis Date Noted  . Labile blood pressure   . Seizures ([facility])   . Transaminitis   . Sleep disturbance   . Anxiety state   . ICH (intracerebral hemorrhage) ([facility]) 12/09/2018   Past Medical History: No past medical history on file. Past Surgical History: No past surgical history on file. Social History:  reports that he has been smoking cigarettes. He has been smoking about 1.00 pack per day. He has never used smokeless tobacco. He reports current alcohol use. He reports current drug use. Drug: Marijuana.  Family / Support Systems Marital Status: Single Patient Roles: Parent, Partner Spouse/Significant Other: girlfriend, Walter Grimes @ [phone] Children: Pt has 3 children living [place] (ages 357, 254 and 29 yo) Other Supports: parents:  mother, Walter Grimes @ [phone] and step-father, Walter Grimes @ [phone] Anticipated Caregiver: Mom and Step dad Ability/Limitations of Caregiver: Mom is disabled but can provide supervision to min asisst Caregiver Availability: 24/7 Family Dynamics: Pt notes he has a very good relationship with his parents and good support.  Pt reluctant to name mother as caregiver because of her own health issues (on HD) and her family responsibilities.  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 Legal History/Current Legal Issues: None Guardian/Conservator: None - per MD, pt is not capable of making decisions on his own behalf - defer to parent   Abuse/Neglect Abuse/Neglect Assessment  Can Be Completed: Yes Physical Abuse: Denies Verbal Abuse: Denies Sexual Abuse: Denies Exploitation of patient/patient's resources: Denies Self-Neglect: Denies  Emotional Status Pt's affect, behavior and adjustment status: Pt lying in bed and able to complete basic assessment interview.  Does repeat stories at times but easily redirected.  Pt a little tearful when I entered room and reports, "I'm just having a moment".  He is very concerned about his limitations, financial issues and family issues.  Have referred for neuropsychology to eval and provide additional emotional support. Recent Psychosocial Issues: Pt reports that he was recently able to stop working at [employer] as he was doing enough work in his recording studio and very happy about that.  Was also "in a good place" with his girlfriend and working to get more time with his children. Psychiatric History: None Substance Abuse History: None  Patient / Family Perceptions, Expectations & Goals Pt/Family understanding of illness & functional limitations: Pt with general understanding of his ICH and resulting deficits/ need for CIR. Premorbid pt/family roles/activities: Pt completely independent and working f/t. Anticipated changes in roles/activities/participation: Per min assist goals, parent to assume primary support roles. Pt/family expectations/goals: "I just don't want to put much on my mom."  [employer] Community Agencies: None Premorbid Home Care/DME Agencies: None Transportation available at discharge: yes Resource referrals recommended: Neuropsychology, Support group (specify)  Discharge Planning Living Arrangements: Parent, Non-relatives/Friends(was staying mostly at girlfriend's house recently) Support Systems: Spouse/significant other, Parent, Other relatives, Friends/neighbors Type of Residence: Private residence Insurance Resources: Customer service managerelf-pay Financial Resources: Employment Financial Screen Referred:  Previously completed Living Expenses: Lives with family Money Management: Patient Does the patient have any problems obtaining your medications?:  Yes (Describe)(uninsured) Home Management: Pt independent with this Patient/Family Preliminary Plans: Pt will likely d/c to parents' home Social Work Anticipated Follow Up Needs: HH/OP Expected length of stay: 14-16 days  Clinical Impression Unfortunate young gentleman here after suffering an acute ICH. Pt is gaining more awareness each day of his resulting deficits and mood is down (referral place to neuropsychology).  Good family support with parents prepared to provide 24/7 support, however, pt reluctant to place "burden" on his mother "who already has a lot going on."  Pt is motivated but a reluctant to take medications/ MD addressing this.  SW to follow for support and d/c planning needs.   Walter Grimes 12/22/2018, 1:45 PM

## 2018-12-22 NOTE — Progress Notes (Signed)
Patient information reviewed and entered into [facility] System by Becky Clella Mckeel, PPS coordinator. Information including medical coding, function ability, and quality indicators will be reviewed and updated through discharge.   

## 2018-12-22 NOTE — Progress Notes (Signed)
Speech Language Pathology Daily Session Note  Patient Details  Name: Walter Grimes MRN: [id number] Date of Birth: 23-Oct-1990  Today's Date: 12/22/2018 SLP Individual Time: 1355-1445 SLP Individual Time Calculation (min): 50 min  Short Term Goals: Week 1: SLP Short Term Goal 1 (Week 1): Pt will utilize word finding strategies to cnvey semi-complex information with supervision cues. SLP Short Term Goal 2 (Week 1): Pt will complete semi-complex problem solving tasks with Min A cues. SLP Short Term Goal 3 (Week 1): Pt will demonstrate selective attention in moderately distracting environment for ~ 45 minutes with supervision cues. SLP Short Term Goal 4 (Week 1): Pt will demonstrate intellectual awareness for cognitive deficit in 7 out of 10 opportunities with Mod A cues.  Skilled Therapeutic Interventions:  Skilled ST services focused on cognitive skills. SLP facilitated semi-complex problem solving skills with calendar organization task, pt required min A verbal cues and SLP recorded notes to impairment in RUE. SLP also facilitated word finding strategies in communication barrier task pt required supervision A verbal cues. SLP provided emotional support due to acute CVA changes and impact on current life function. Pt demonstrated increase awareness of deficits and required mod A verbal cues to name specific cognitive and physical deficits. Pt was left in room with call bell within reach and chair alarm set. ST recommends to continue skilled ST services.      Pain Pain Assessment Pain Scale: 0-10 Pain Score: 5  Pain Type: Acute pain Pain Location: Head Pain Descriptors / Indicators: Headache Pain Frequency: Intermittent Pain Onset: On-going Patients Stated Pain Goal: 2 Pain Intervention(s): Medication (See eMAR)  Therapy/Group: Individual Therapy  Brynlyn Dade  [facility] 12/22/2018, 7:55 AM

## 2018-12-22 NOTE — Progress Notes (Signed)
Patient refused all medications, states he will not take anything he is irritated and wants staff to leave him alone. MD, patel aware. No complications noted at this time. Patient shows no s/s of distress.  Audie Clear, LPN

## 2018-12-22 NOTE — Progress Notes (Signed)
Occupational Therapy Session Note  Patient Details  Name: Walter Grimes MRN: [id number] Date of Birth: 17-Jun-1991  Today's Date: 12/22/2018 OT Individual Time: 4650-3546 OT Individual Time Calculation (min): 54 min  and Today's Date: 12/22/2018 OT Missed Time: 21 Minutes Missed Time Reason: Patient unwilling/refused to participate without medical reason   Short Term Goals: Week 1:  OT Short Term Goal 1 (Week 1): Pt will complete UB dressing with supervision following hemi dressing techniques for 3 consecutive sessions. OT Short Term Goal 2 (Week 1): Pt will complete LB bathing sit to stand with min assist. OT Short Term Goal 3 (Week 1): Pt will perform LB dressing with mod assist sit to stand. OT Short Term Goal 4 (Week 1): Pt will use the RUE as stabilizer for holding grooming and bathing items with min instructional cueing and supervision. OT Short Term Goal 5 (Week 1): Pt will complete toilet transfer stand pivot with mod assist.  Skilled Therapeutic Interventions/Progress Updates:    Pt greeted semi-reclined in bed, initially refusing therapy. Pt perseverative on hallucinations he says he is having and difficulty sleeping. Pt reports double vision as well and seems to confuse hallucinations with diplopia. OT utilized therapeutic use of self to encourage participation. Pt stated " I have family member who are paralyzed from gun shots, I know how this goes." OT educated pt on recovery factors of stroke and how his stroke is much different than spinal cord injury. Educated pt on importance of participating in therapy in order to regain functional independence. Pt eventually agreeable to sit EOB with Mod A. Pt able to maintain sitting balance with min/supervision. Pt needed assistance to thread pant legs, then completed sit<>stand with max A and L knee block and assistance to pull up pants. Pt returned to sitting, then completed stand-pivot to wc with max A and facilitation for pivot. OT provided  pt with eye patch for R eye with improved double vision per pt report. Pt brought to the sink in wc and was educated on functional use of R UE to grasp toothpaste while L hand removed lid. Pt with good finger flex/ext and able to use functionally. Pt with more proximal shoulder weakness. OT encouraged pt to go with OT to the gym for R UE neuro re-ed. Pt stated "I don't feel like it." and was taking phone calls. OT provided pt with blue foam to work on grip strength, but he declined further participation. Pt left seated in wc with alarm belt on call bell in reach.  Therapy Documentation Precautions:  Precautions Precautions: Fall Restrictions Weight Bearing Restrictions: No General: General OT Amount of Missed Time: 21 Minutes Pain:  Unable to give number. Pt states his "brain hurts" but unable to elaborate. Pt repositioned for comfort.   Therapy/Group: Individual Therapy  Valma Cava 12/22/2018, 12:53 PM

## 2018-12-22 NOTE — Progress Notes (Signed)
[place] PHYSICAL MEDICINE & REHABILITATION PROGRESS NOTE   Subjective/Complaints: Patient seen laying in bed this morning.  He is upset and irritated.  He states he did not sleep well overnight and has a headache this morning.  He is not pleased with the staff.  Patient also tangential.  Patient initially stated that he does not want to take any medications.  Informed patient that he could take headache medications if desired.  Patient later states that he has been taking all of his medications.  Discussed with nursing, patient has been refusing medications.  ROS: + Headache.  Denies CP, SOB, N/V/D  Objective:   No results found. Recent Labs    12/19/18 1703 12/22/18 0719  WBC 6.1 6.3  HGB 16.9 15.4  HCT 48.7 44.1  PLT 308 323   Recent Labs    12/19/18 1703 12/22/18 0719  NA  --  136  K  --  3.8  CL  --  99  CO2  --  26  GLUCOSE  --  100*  BUN  --  19  CREATININE 1.05 0.97  CALCIUM  --  9.7    Intake/Output Summary (Last 24 hours) at 12/22/2018 1031 Last data filed at 12/22/2018 1020 Gross per 24 hour  Intake 480 ml  Output 650 ml  Net -170 ml     Physical Exam: Vital Signs Blood pressure 118/79, pulse 95, temperature 97.9 F (36.6 C), temperature source Oral, resp. rate 19, height 5\' 8"  (1.727 m), weight 74.5 kg, SpO2 99 %.  Constitutional: No distress . Vital signs reviewed. HENT: Normocephalic.  Atraumatic. Eyes: EOMI. No discharge. Cardiovascular: No JVD. Respiratory: Normal effort. GI: Non-distended. Musc: No edema or tenderness in extremities. Neurological: Alert Motor: RUE: 2/5 shoulder abduction, 3/5 elbow flexion and handgrip, 2/5 elbow extension, wrist extension.  [name]: 3/5 proximal to distal  LUE/LLE: 5/5 proximal distal  Skin: Numerous tattoos. Psychiatric: Anxious.  Irritated.  Assessment/Plan: 1. 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  Physiatrist is providing close team supervision and 24 hour management of active medical problems listed below.  Physiatrist and rehab team continue to assess barriers to discharge/monitor patient progress toward functional and medical goals  Care Tool:  Bathing    Body parts bathed by patient: Right arm, Chest, Abdomen, Right upper leg, Left upper leg, Left lower leg, Face   Body parts bathed by helper: Front perineal area, Buttocks, Left arm, Right lower leg     Bathing assist Assist Level: Maximal Assistance - Patient 24 - 49%     Upper Body Dressing/Undressing Upper body dressing   What is the patient wearing?: Pull over shirt    Upper body assist Assist Level: Moderate Assistance - Patient 50 - 74%    Lower Body Dressing/Undressing Lower body dressing      What is the patient wearing?: Underwear/pull up     Lower body assist Assist for lower body dressing: Contact Guard/Touching assist     Toileting Toileting    Toileting assist Assist for toileting: Independent with assistive device Assistive Device Comment: urinal   Transfers Chair/bed transfer  Transfers assist     Chair/bed transfer assist level: Moderate Assistance - Patient 50 - 74%     Locomotion Ambulation   Ambulation assist      Assist level: 2 helpers Assistive device: No Device Max distance: 56ft   Walk 10 feet activity   Assist  Walk 10 feet activity did not occur: Safety/medical concerns        Walk 50 feet activity   Assist Walk 50 feet with 2 turns activity did not occur: Safety/medical concerns         Walk 150 feet activity   Assist Walk 150 feet activity did not occur: Safety/medical concerns         Walk 10 feet on uneven surface  activity   Assist Walk 10 feet on uneven surfaces activity did not occur: Safety/medical concerns         Wheelchair     Assist Will patient use wheelchair at discharge?: (TBD)    Wheelchair activity did not occur: Safety/medical concerns(unable due to pt fatigue)         Wheelchair 50 feet with 2 turns activity    Assist            Wheelchair 150 feet activity     Assist           Medical Problem List and Plan: 1.Cognitive deficits with decreased functional mobilitysecondary toleft parietal frontal ICH with IVH  Continue CIR  -[name] [name]  Notes reviewed- left ICH with seizures, images personally reviewed- moderate left ICH, labs personally reviewed 2. Antithrombotics: -DVT/anticoagulation:Subcutaneous heparin initiated 12/12/2018 -antiplatelet therapy: N/A 3. Pain Management: Tramadol DC'd  Tylenol scheduled  Fioricet as needed for headache 4. Mood:Xanax 0.25 mg 3 times daily  -team will need to provide regular reassurance  -neuropsych eval this week -antipsychotic agents: N/A 5. Neuropsych: This patientis notcapable of making decisions on hisown behalf. 6. Skin/Wound Care:Routine skin checks 7. Fluids/Electrolytes/Nutrition:encourage PO  BMP within normal limits on 6/29 8. Seizure disorder. Valproate 750 mg every 8 hours.   Latest valproic acid level 57 on 6/21 9. History of alcohol tobacco and marijuana use. Provide counseling as appropriate 10.  Sleep disturbance  Trazodone nightly 11.  Transaminitis  LFTs elevated on 6/29  Continue to monitor  LOS: 3 days A FACE TO FACE EVALUATION WAS PERFORMED  Eion Timbrook Karis Jubanil Natalin Bible 12/22/2018, 10:31 AM

## 2018-12-22 NOTE — Progress Notes (Signed)
Physical Therapy Session Note  Patient Details  Name: Walter Grimes MRN: [id number] Date of Birth: Oct 25, 1990  Today's Date: 12/22/2018 PT Individual Time: 1235-1300 AND 1500-1525 PT Individual Time Calculation (min): 25 min  AND 25 min  Short Term Goals: Week 1:  PT Short Term Goal 1 (Week 1): Pt will maintain sitting balance on EOM/EOB without UE support with supervision PT Short Term Goal 2 (Week 1): Pt will perform bed<>chair transfers with mod assist PT Short Term Goal 3 (Week 1): Pt will ambulate 35ft using LRAD with max assist of 1 person  Skilled Therapeutic Interventions/Progress Updates:   Attempted to see pt for scheduled session at 8am. Upon entering room, pt immediately stating he is not doing any physical therapy this morning as he has been up since 4 am waiting for his breakfast. Pt adamant about not participating despite encouragement. Will re-attempt visit this afternoon.   Session 1:  Pt in supine and agreeable to therapy w/ encouragement, no c/o pain. Supine>sit w/ min assist and mod assist stand pivot to w/c on L side. Total assist w/c transport to/from therapy gym. Worked on gait training in hallway @ rail on L side. Sit<>stand w/ min assist and pre-gait w/ L forward and backward stepping w/ emphasis on maintaining R quad activation in single leg stance. Min tactile and manual cues, pt also hyperextending to achieve stability, states he feels his leg is going to buckle. Ambulated 30' at rail w/ mod assist for R step placement and R knee extension, therapist providing posterior knee support to prevent hyperextension. Returned to room via w/c and demonstrated [name] open chain exercises to work on muscle activation including LAQs and heel slides, ended session in w/c and all needs in reach. Ongoing encouragement and education throughout session regarding his CLOF and potential progress. Ended session in w/c and all needs in reach.   Session 2:  Pt in w/c and agreeable to  therapy, no c/o pain. Requesting assistance w/ washing up UEs at sink. Hand-over-hand assist to utilize RUE to wash LUE and underarms and to utilize self-care items such as lotion and deodorant. Verbal and visual cues for technique as well. Instructed on w/c mobility via L hemi technique for increased independence w/ locomotion while in the hospital. Max verbal and visual cues fading to supervision w/ practice x150'. Pt excited to have a means of locomotion until gait is functional. Returned to room and ended session in w/c, all needs in reach.   Therapy Documentation Precautions:  Precautions Precautions: Fall Restrictions Weight Bearing Restrictions: No  Therapy/Group: Individual Therapy  Carliyah Cotterman K Brihana Quickel 12/22/2018, 2:04 PM

## 2018-12-23 ENCOUNTER — Inpatient Hospital Stay (HOSPITAL_COMMUNITY): Payer: Self-pay | Admitting: Occupational Therapy

## 2018-12-23 ENCOUNTER — Inpatient Hospital Stay (HOSPITAL_COMMUNITY): Payer: Self-pay | Admitting: Speech Pathology

## 2018-12-23 ENCOUNTER — Encounter (HOSPITAL_COMMUNITY): Payer: Self-pay | Admitting: Psychology

## 2018-12-23 ENCOUNTER — Inpatient Hospital Stay (HOSPITAL_COMMUNITY): Payer: Self-pay | Admitting: Physical Therapy

## 2018-12-23 NOTE — Progress Notes (Signed)
PHYSICAL MEDICINE & REHABILITATION PROGRESS NOTE   Subjective/Complaints: Patient seen laying in bed this morning.  He states he did not sleep well overnight due to several ongoing psychosocial factors.  He notes improvement in strength.  ROS: Denies CP, SOB, N/V/D  Objective:   No results found. Recent Labs    12/22/18 0719  WBC 6.3  HGB 15.4  HCT 44.1  PLT 323   Recent Labs    12/22/18 0719  NA 136  K 3.8  CL 99  CO2 26  GLUCOSE 100*  BUN 19  CREATININE 0.97  CALCIUM 9.7    Intake/Output Summary (Last 24 hours) at 12/23/2018 1026 Last data filed at 12/23/2018 0847 Gross per 24 hour  Intake 100 ml  Output 1300 ml  Net -1200 ml     Physical Exam: Vital Signs Blood pressure 122/75, pulse 87, temperature 98.3 F (36.8 C), temperature source Oral, resp. rate 18, height 5\' 8"  (1.727 m), weight 74.5 kg, SpO2 97 %.  Constitutional: No distress . Vital signs reviewed. HENT: Normocephalic.  Atraumatic. Eyes: EOMI.  No discharge. Cardiovascular: No JVD. Respiratory: Normal effort. GI: Non-distended. Musc: No edema or tenderness in extremities. Neurological: Alert Motor: RUE: 3/5 shoulder abduction, 3+/5 elbow flexion and handgrip, 3/5 elbow extension, wrist extension.  [name]: 3/5 hip flexion, knee extension, 0/5 ankle dorsiflexion LUE/LLE: 5/5 proximal distal  Skin: Numerous tattoos. Psychiatric: Flat  Assessment/Plan: 1. 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  Physiatrist is providing close team supervision and 24 hour management of active medical problems listed below.  Physiatrist and rehab team continue to assess barriers to discharge/monitor patient progress toward functional and medical goals  Care Tool:  Bathing    Body parts bathed by patient: Right arm, Chest, Abdomen, Right upper leg, Left upper leg, Left lower  leg, Face   Body parts bathed by helper: Front perineal area, Buttocks, Left arm, Right lower leg     Bathing assist Assist Level: Maximal Assistance - Patient 24 - 49%     Upper Body Dressing/Undressing Upper body dressing   What is the patient wearing?: Pull over shirt    Upper body assist Assist Level: Moderate Assistance - Patient 50 - 74%    Lower Body Dressing/Undressing Lower body dressing      What is the patient wearing?: Underwear/pull up     Lower body assist Assist for lower body dressing: Contact Guard/Touching assist     Toileting Toileting    Toileting assist Assist for toileting: Moderate Assistance - Patient 50 - 74% Assistive Device Comment: urinal   Transfers Chair/bed transfer  Transfers assist     Chair/bed transfer assist level: Moderate Assistance - Patient 50 - 74%     Locomotion Ambulation   Ambulation assist      Assist level: Moderate Assistance - Patient 50 - 74% Assistive device: Other (comment)(rail in hallway) Max distance: 30'   Walk 10 feet activity   Assist  Walk 10 feet activity did not occur: Safety/medical concerns  Assist level: Moderate Assistance - Patient - 50 - 74% Assistive device: Other (comment)(rail in hallway)   Walk 50 feet activity   Assist Walk 50 feet with 2 turns activity did not occur: Safety/medical concerns         Walk 150 feet activity   Assist Walk 150 feet activity did not occur: Safety/medical concerns  Walk 10 feet on uneven surface  activity   Assist Walk 10 feet on uneven surfaces activity did not occur: Safety/medical concerns         Wheelchair     Assist Will patient use wheelchair at discharge?: (TBD)   Wheelchair activity did not occur: Safety/medical concerns(unable due to pt fatigue)         Wheelchair 50 feet with 2 turns activity    Assist            Wheelchair 150 feet activity     Assist           Medical Problem  List and Plan: 1.Cognitive deficits with decreased functional mobilitysecondary toleft parietal frontal ICH with IVH  Continue CIR  -[name] [name] 2. Antithrombotics: -DVT/anticoagulation:Subcutaneous heparin initiated 12/12/2018 -antiplatelet therapy: N/A 3. Pain Management: Tramadol DC'd  Tylenol scheduled-improvement in headaches  Fioricet as needed for headache 4. Mood:Xanax 0.25 mg 3 times daily  team will need to provide regular reassurance  neuropsych eval  antipsychotic agents: N/A 5. Neuropsych: This patientis notcapable of making decisions on hisown behalf. 6. Skin/Wound Care:Routine skin checks 7. Fluids/Electrolytes/Nutrition:encourage PO  BMP within normal limits on 6/29 8. Seizure disorder. Valproate 750 mg every 8 hours.   Latest valproic acid level 57 on 6/21, labs ordered for tomorrow 9. History of alcohol tobacco and marijuana use. Provide counseling as appropriate 10.  Sleep disturbance, predominantly due to psychosocial stressors  Trazodone nightly 11.  Transaminitis  LFTs elevated on 6/29, will repeat labs later this week  Continue to monitor  LOS: 4 days A FACE TO FACE EVALUATION WAS PERFORMED   Karis JubaAnil  12/23/2018, 10:26 AM

## 2018-12-23 NOTE — Consult Note (Signed)
Neuropsychological Consultation   Patient:   Walter Grimes   DOB:   [birth year]  MR Number:  [id number]  [facility]  [facility] [address] [id number] [facility] [place] [id number] Dept: [phone] Loc: [phone]           Date of Service:   12/23/2018  Start Time:   1 PM End Time:   2 PM  Provider/Observer:  Arley PhenixJohn , Psy.D.       Clinical Neuropsychologist       Billing Code/Service: 96158/96159  Chief Complaint:    Walter Grimes is a 28 year old male with history of alcohol and tobacco use on no prescription medications.  Presented 12/08/2018 with altered mental status unresponsiveness and reported seizure.  CT/MRI scan showed a 5.8 x 2.8 cm left parietal hemorrhage with intraventricular extension with 4 mm left to right midline shift.  Patient with sustained and persistent alterations of consciousness and confusion during initial part of CIR program.  Patient improving orientation and memory.  Seizures have been controlled but patient reports that he has had hallucinations over past few days but reports that they have stopped over past 24 hours.  Anxiety and confusion persist.    Reason for Service:  GMW:[id number]:Walter Grimes is a 28 year old right-handed male with documented history of alcohol and tobacco use on no prescription medications. Per chart review patient lives with girlfriendof 1 month andindependent prior to admission. 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 Plans to stay with his mother and stepfather on discharge with assistance as needed. Presented 12/08/2018 with altered mental status unresponsiveness and reported seizure by EMS lasting approximately 3 minutes. Urine drug screen positive marijuana. Cranial CT/MRI scan showed a 5.8 x 2.8 cm left parietal hemorrhage with intraventricular extension with 4 mm left to right midline shift. Normal MRA of the head.  Initial EEG negative for seizure. Follow-up EEG 12/12/2018 and 12/14/2018 left focal slowing with few electrographic seizures. Patient loaded with Keppra and later changed to valproate 750 mg every 8 hours.. 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 Follow-up MRI 12/16/2018 unchanged left parietal hemorrhage with no enhancing lesions or tumor. Patient initially maintained on 3% for cerebral edema since resolved and discontinued. Echocardiogram with ejection fraction of 65%. His diet has been advanced to regular. Maintained on Fioricet for bouts of headaches. Therapy evaluations completed and patient was admitted for a comprehensive rehab program.  Current Status:   Patient improving orientation and memory.  Seizures have been controlled but patient reports that he has had hallucinations over past few days but reports that they have stopped over past 24 hours.  Anxiety and confusion persist.  Attention is improving but still distracted and difficulty with focus and sustained attention.  Patient continues with right side weakness and reports of outer right visual field disturbance.    Behavioral Observation: Walter Grimes  presents as a 28 y.o.-year-old Right African American Male who appeared his stated age. his dress was Appropriate and he was Well Groomed and his manners were Appropriate to the situation.  his participation was indicative of Appropriate and Redirectable behaviors.  There were any physical disabilities noted.  he displayed an appropriate level of cooperation and motivation.     Interactions:    Active Appropriate and Redirectable  Attention:   abnormal and attention span appeared shorter than expected for age  Memory:   abnormal; remote memory intact, recent memory impaired  Visuo-spatial:  not examined  Speech (Volume):  low  Speech:   normal; normal  Thought Process:  Coherent and Tangential  Though Content:  WNL; not suicidal  and not homicidal  Orientation:   person, place and time/date  Judgment:   Fair  Planning:   Fair  Affect:    Anxious  Mood:    Anxious  Insight:   Fair  Intelligence:   normal  Psychiatric History:  Patient denies past psychiatric history.  Family Med/Psych History: History reviewed. No pertinent family history.  Risk of Suicide/Violence: low Patient denies SI or HI.  Impression/DX:  Walter Grimes is a 28 year old male with history of alcohol and tobacco use on no prescription medications.  Presented 12/08/2018 with altered mental status unresponsiveness and reported seizure.  CT/MRI scan showed a 5.8 x 2.8 cm left parietal hemorrhage with intraventricular extension with 4 mm left to right midline shift.  Patient with sustained and persistent alterations of consciousness and confusion during initial part of CIR program.  Patient improving orientation and memory.  Seizures have been controlled but patient reports that he has had hallucinations over past few days but reports that they have stopped over past 24 hours.  Anxiety and confusion persist.    Patient improving orientation and memory.  Seizures have been controlled but patient reports that he has had hallucinations over past few days but reports that they have stopped over past 24 hours.  Anxiety and confusion persist.  Attention is improving but still distracted and difficulty with focus and sustained attention.  Patient continues with right side weakness and reports of outer right visual field disturbance.   Disposition/Plan:  Will follow-up with patient due to coping and adjustment issues.  Diagnosis:    ICH, Seizures, anxiety        Electronically Signed   _______________________ Ilean Skill, Psy.D.

## 2018-12-23 NOTE — Progress Notes (Signed)
Speech Language Pathology Daily Session Note  Patient Details  Name: [name] MRN: [id number] Date of Birth: 06/13/1991  Today's Date: 12/23/2018 SLP Individual Time: 1410-1440 SLP Individual Time Calculation (min): 30 min  Short Term Goals: Week 1: SLP Short Term Goal 1 (Week 1): Pt will utilize word finding strategies to cnvey semi-complex information with supervision cues. SLP Short Term Goal 2 (Week 1): Pt will complete semi-complex problem solving tasks with Min A cues. SLP Short Term Goal 3 (Week 1): Pt will demonstrate selective attention in moderately distracting environment for ~ 45 minutes with supervision cues. SLP Short Term Goal 4 (Week 1): Pt will demonstrate intellectual awareness for cognitive deficit in 7 out of 10 opportunities with Mod A cues.  Skilled Therapeutic Interventions: Pt emotional/tearful, expressing disbelief that he is in the hospital and experienced a brain hemorrhage.  Offered encouragement and support.  Pt able to identify physical deficits s/p ICH; has more difficulty recognizing cognitive changes.  Retells story of events immediately preceding hospitalization at least five times; required verbal prompt to shift focus and discuss recent events/memories of last 24 hours - pt with difficulty placing events on timeline.  Distracted by phone calls during session, but able to refocus and sustain attention with min verbal cues. Conversational speech fluent with no notable word-finding errors. Occasional delays, but ultimate success, in word-retrieval when conveying information about his prior jobs.  [place] SLP to address cognition/higher level language.   Pain Pain Assessment Pain Scale: 0-10 Pain Score: 0-No pain  Therapy/Group: Individual Therapy  Juan Quam Laurice 12/23/2018, 2:58 PM

## 2018-12-23 NOTE — Progress Notes (Signed)
Physical Therapy Session Note  Patient Details  Name: Walter Grimes MRN: [id number] Date of Birth: [birth year]  Today's Date: 12/23/2018 PT Individual Time: 0998-3382 PT Individual Time Calculation (min): 65 min   Short Term Goals: Week 1:  PT Short Term Goal 1 (Week 1): Pt will maintain sitting balance on EOM/EOB without UE support with supervision PT Short Term Goal 2 (Week 1): Pt will perform bed<>chair transfers with mod assist PT Short Term Goal 3 (Week 1): Pt will ambulate 86ft using LRAD with max assist of 1 person  Skilled Therapeutic Interventions/Progress Updates:    Pt received supine in bed and agreeable to therapy session. 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 Pt also reports he is continuing to have the dizziness, which he believes is associated with the blood thinner shot in his stomach. Supine>sit with mod assist for R LE management and trunk upright. Stand pivot transfer EOB>w/c with mod assist for lifting and pivoting hips as well as blocking R LE. Transported to/from gym in w/c. Stand pivot w/c>EOM with mod assist for lifting/lowering and pivoting hips - cuing for sequencing. Performed repeated sit<>stands from EOM, no UE support with mirror feedback for R lateral weight shifting for increased weightbearing - manual facilitation and multimodal cuing for increased R LE quad/glute activation. Performed standing mini-squats without UE support focusing on equal B LE weightbearing (or R preference) with manual facilitation for R LE knee control for flexion/extension and mirror feedback for alignment - mod/max assist for balance and R LE knee control. Pt reports that he has transfer bed<>w/c in room without nursing assistance - pt extensively educated on importance of calling nursing staff for assistance and the risk of falls.   Performed the  following exercises focusing on R LE NMR and muscle activation with multimodal cuing and manual facilitation for improved muscle recruitment and proper technique/form: - 2x10 Supine bridging with R LE bias - manual facilitation for hip/ankle alignment due to pt's leg falling into abduction - 2x10 supine hip adductor isometric squeeze   - supine hip/knee flexion/extension with assist for knee flexion due to extensor tone  - 2x10 repeated bridges with hip adductor isometric squeeze with pt continuing to demonstrate impaired hip control but with improvement noted - attempted L sidelying R LE knee flexion hamstring curls but no palpable muscle activation noted in this position Supine>sit with mod assist for R LE management and trunk upright. Mod assist stand pivot EOM>w/c>EOB for lifting/lowering and pivoting. Sit>supine with mod assist for R LE management and pt using bedrails. Pt left supine in bed with needs in reach and bed alarm on.   Therapy Documentation Precautions:  Precautions Precautions: Fall Restrictions Weight Bearing Restrictions: No  Pain:  Reports minimal muscle soreness pain in R LE quadriceps during exercises - performed to pt tolerance with rest breaks provided.   Therapy/Group: Individual Therapy  Tawana Scale, PT, DPT 12/23/2018, 12:34 PM

## 2018-12-23 NOTE — Progress Notes (Signed)
Occupational Therapy Session Note  Patient Details  Name: Walter Grimes MRN: [id number] Date of Birth: [birth year]  Today's Date: 12/23/2018 OT Individual Time: 0044-7158 OT Individual Time Calculation (min): 75 min   Short Term Goals: Week 1:  OT Short Term Goal 1 (Week 1): Pt will complete UB dressing with supervision following hemi dressing techniques for 3 consecutive sessions. OT Short Term Goal 2 (Week 1): Pt will complete LB bathing sit to stand with min assist. OT Short Term Goal 3 (Week 1): Pt will perform LB dressing with mod assist sit to stand. OT Short Term Goal 4 (Week 1): Pt will use the RUE as stabilizer for holding grooming and bathing items with min instructional cueing and supervision. OT Short Term Goal 5 (Week 1): Pt will complete toilet transfer stand pivot with mod assist.  Skilled Therapeutic Interventions/Progress Updates:    Pt greeted semi-reclined in bed and much more agreeable to therapy this morning. Pt agreeable to shower. Pt completed stand-pivot transfers with mod A to stand and mod/max A to pivot. Worked on squat-pivot transfers as well with more mod A and improved head/hips relationship with practice. Pt with increased R LE extensor tone with weight bearing today. Bathing completed shower level with focus on functional use of R UE for neuro re-ed. Pt able to grasp wash cloth and OT provided proximal support at elbow to facilitate elbow flex/ext and shoulder activation. Pt with good awareness of R UE and only needs min verbal cues to integrate it into tasks. Worked on [employer] and fine motor control with opening containers. Educated on hemi-dressing techniques with blocked practice and cues for carryover. Pt left seated in wc at end of session with safety belt on and needs met.   Therapy Documentation Precautions:  Precautions Precautions: Fall Restrictions Weight Bearing Restrictions: No Pain: Pain Assessment Pain Scale: 0-10 Pain Score: 0-No  pain   Therapy/Group: Individual Therapy  Valma Cava 12/23/2018, 10:48 AM

## 2018-12-24 ENCOUNTER — Inpatient Hospital Stay (HOSPITAL_COMMUNITY): Payer: Self-pay

## 2018-12-24 DIAGNOSIS — I612 Nontraumatic intracerebral hemorrhage in hemisphere, unspecified: Secondary | ICD-10-CM

## 2018-12-24 DIAGNOSIS — R0989 Other specified symptoms and signs involving the circulatory and respiratory systems: Secondary | ICD-10-CM

## 2018-12-24 LAB — VALPROIC ACID LEVEL: Valproic Acid Lvl: 33 ug/mL — ABNORMAL LOW (ref 50.0–100.0)

## 2018-12-24 MED ORDER — ALPRAZOLAM 0.25 MG PO TABS
0.2500 mg | ORAL_TABLET | Freq: Three times a day (TID) | ORAL | Status: DC | PRN
  Administered 2019-01-03: 0.25 mg via ORAL
  Filled 2018-12-24: qty 1

## 2018-12-24 MED ORDER — BACLOFEN 5 MG HALF TABLET
5.0000 mg | ORAL_TABLET | Freq: Three times a day (TID) | ORAL | Status: DC
  Administered 2018-12-24: 5 mg via ORAL
  Filled 2018-12-24 (×3): qty 1

## 2018-12-24 NOTE — Progress Notes (Signed)
Occupational Therapy Session Note  Patient Details  Name: Walter Grimes MRN: [id number] Date of Birth: March 14, 1991  Today's Date: 12/24/2018 OT Individual Time: 8295-6213 OT Individual Time Calculation (min): 54 min    Short Term Goals: Week 1:  OT Short Term Goal 1 (Week 1): Pt will complete UB dressing with supervision following hemi dressing techniques for 3 consecutive sessions. OT Short Term Goal 2 (Week 1): Pt will complete LB bathing sit to stand with min assist. OT Short Term Goal 3 (Week 1): Pt will perform LB dressing with mod assist sit to stand. OT Short Term Goal 4 (Week 1): Pt will use the RUE as stabilizer for holding grooming and bathing items with min instructional cueing and supervision. OT Short Term Goal 5 (Week 1): Pt will complete toilet transfer stand pivot with mod assist.  Skilled Therapeutic Interventions/Progress Updates:    1:1. Pt with no report of pain and declines shower. Pt completes stand pivot transfer with MOD A to L with LLE block to prevent adduction. Pt stands to void uring with MOD A d/t posterior/lateral lean. Pt completes grooming at sink with VC for incorporation of RUE into washing face for NMR. Pt completes self feeding and set up of tray opening butter/seasoning packages with BUE for [name] with VC for using RUE as support to hold container and LUE to open. OT applies coban grip to spoon for pt to self feed with RUE to consume cereal. Pt attempting to multitask while being on phone call and eating at same time with VC for making therapy and RUE work a priority after spilling 3 bites while trying to complete both tasks. Exited session with pt seated in bed, exit alarm and call light in reach  Therapy Documentation Precautions:  Precautions Precautions: Fall Restrictions Weight Bearing Restrictions: No General:   Vital Signs: Therapy Vitals Temp: 97.7 F (36.5 C) Pulse Rate: 98 Resp: 19 BP: (!) 146/85 Patient Position (if appropriate):  Sitting Oxygen Therapy SpO2: 100 % O2 Device: Room Air Pain:   ADL: ADL Eating: Set up Where Assessed-Eating: Wheelchair Grooming: Minimal assistance Where Assessed-Grooming: Edge of bed Upper Body Bathing: Minimal assistance Where Assessed-Upper Body Bathing: Edge of bed Lower Body Bathing: Maximal assistance Where Assessed-Lower Body Bathing: Edge of bed Upper Body Dressing: Moderate assistance Where Assessed-Upper Body Dressing: Edge of bed Lower Body Dressing: Maximal assistance Where Assessed-Lower Body Dressing: Edge of bed Toileting: Maximal assistance Where Assessed-Toileting: Bedside Commode Toilet Transfer: Maximal assistance Toilet Transfer Method: Stand pivot Science writer: Art gallery manager    Praxis   Exercises:   Other Treatments:     Therapy/Group: Individual Therapy  Tonny Branch 12/24/2018, 7:20 AM

## 2018-12-24 NOTE — Care Management (Signed)
[facility]patient [facility] Individual Statement of Services  Patient Name:  Walter Grimes  Date:  12/23/2018  Welcome to the [facility].  Our goal is to provide you with an individualized program based on your diagnosis and situation, designed to meet your specific needs.  With this comprehensive rehabilitation program, you will be expected to participate in at least 3 hours of rehabilitation therapies Monday-Friday, with modified therapy programming on the weekends.  Your rehabilitation program will include the following services:  Physical Therapy (PT), Occupational Therapy (OT), Speech Therapy (ST), 24 hour per day rehabilitation nursing, Therapeutic Recreaction (TR), Neuropsychology, Case Management (Social Worker), Rehabilitation Medicine, Nutrition Services and Pharmacy Services  Weekly team conferences will be held on Wednesdays to discuss your progress.  Your Social Worker will talk with you frequently to get your input and to update you on team discussions.  Team conferences with you and your family in attendance may also be held.  Expected length of stay: 14-16 days   Overall anticipated outcome: minimal assist  Depending on your progress and recovery, your program may change. Your Social Worker will coordinate services and will keep you informed of any changes. Your Social Worker's name and contact numbers are listed  below.  The following services may also be recommended but are not provided by the [facility] will be made to provide these services after discharge if needed.  Arrangements include referral to agencies that provide these services.  Your insurance has been verified to be:  None Your primary doctor is:  None  Pertinent information will be shared with your doctor and your  insurance company.  Social Worker:  [place], [place] or ([phone]   Information discussed with and copy given to patient by: Lennart Pall, 12/23/2018, 4:58 PM

## 2018-12-24 NOTE — Progress Notes (Addendum)
[facility] PHYSICAL MEDICINE & REHABILITATION PROGRESS NOTE   Subjective/Complaints: Patient seen laying in bed this morning.  He states he again did not sleep well overnight due to stressors and spasms.  ROS: +Spasms. Denies CP, SOB, N/V/D  Objective:   No results found. Recent Labs    12/22/18 0719  WBC 6.3  HGB 15.4  HCT 44.1  PLT 323   Recent Labs    12/22/18 0719  NA 136  K 3.8  CL 99  CO2 26  GLUCOSE 100*  BUN 19  CREATININE 0.97  CALCIUM 9.7    Intake/Output Summary (Last 24 hours) at 12/24/2018 0935 Last data filed at 12/24/2018 0813 Gross per 24 hour  Intake 576 ml  Output 750 ml  Net -174 ml     Physical Exam: Vital Signs Blood pressure (!) 146/85, pulse 98, temperature 97.7 F (36.5 C), resp. rate 19, height 5\' 8"  (1.727 m), weight 74.5 kg, SpO2 100 %.  Constitutional: No distress . Vital signs reviewed. HENT: Normocephalic.  Atraumatic. Eyes: EOMI.  No discharge. Cardiovascular: No JVD. Respiratory: Normal effort. GI: Non-distended. Musc: No edema or tenderness in extremities. Neurological: Alert Motor: RUE: 4-/5 shoulder abduction, 4--4/5 elbow flexion and handgrip, 4-/5 elbow extension, wrist extension.  [name]: 3/5 hip flexion, knee extension, 0/5 ankle dorsiflexion, unchanged Skin: Numerous tattoos. Psychiatric: Flat  Assessment/Plan: 1. 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  Physiatrist is providing close team supervision and 24 hour management of active medical problems listed below.  Physiatrist and rehab team continue to assess barriers to discharge/monitor patient progress toward functional and medical goals  Care Tool:  Bathing    Body parts bathed by patient: Right arm, Chest, Abdomen, Right upper leg, Left upper leg, Left lower leg, Face   Body parts bathed by helper: Front perineal area, Buttocks, Left  arm, Right lower leg     Bathing assist Assist Level: Maximal Assistance - Patient 24 - 49%     Upper Body Dressing/Undressing Upper body dressing   What is the patient wearing?: Pull over shirt    Upper body assist Assist Level: Moderate Assistance - Patient 50 - 74%    Lower Body Dressing/Undressing Lower body dressing      What is the patient wearing?: Underwear/pull up     Lower body assist Assist for lower body dressing: Contact Guard/Touching assist     Toileting Toileting    Toileting assist Assist for toileting: Moderate Assistance - Patient 50 - 74% Assistive Device Comment: urinal   Transfers Chair/bed transfer  Transfers assist     Chair/bed transfer assist level: Minimal Assistance - Patient > 75%     Locomotion Ambulation   Ambulation assist      Assist level: Moderate Assistance - Patient 50 - 74% Assistive device: Other (comment)(rail in hallway) Max distance: 30'   Walk 10 feet activity   Assist  Walk 10 feet activity did not occur: Safety/medical concerns  Assist level: Moderate Assistance - Patient - 50 - 74% Assistive device: Other (comment)(rail in hallway)   Walk 50 feet activity   Assist Walk 50 feet with 2 turns activity did not occur: Safety/medical concerns         Walk 150 feet activity   Assist Walk 150 feet activity did not occur: Safety/medical concerns         Walk 10 feet on uneven surface  activity   Assist  Walk 10 feet on uneven surfaces activity did not occur: Safety/medical concerns         Wheelchair     Assist Will patient use wheelchair at discharge?: (TBD)   Wheelchair activity did not occur: Safety/medical concerns(unable due to pt fatigue)         Wheelchair 50 feet with 2 turns activity    Assist            Wheelchair 150 feet activity     Assist           Medical Problem List and Plan: 1.Cognitive deficits with decreased functional mobilitysecondary  toleft parietal frontal ICH with IVH  Continue CIR  -[name] [name]  Team conference today to discuss current and goals and coordination of care, home and environmental barriers, and discharge planning with nursing, case manager, and therapies.  2. Antithrombotics: -DVT/anticoagulation:Subcutaneous heparin initiated 12/12/2018 -antiplatelet therapy: N/A 3. Pain Management: Tramadol DC'd  Tylenol scheduled-improvement in headaches  Low dose baclofen started on 7/1  Fioricet as needed for headache 4. Mood:Xanax 0.25 mg 3 times daily for anxiety  team will need to provide regular reassurance  Appreciate neuropsych eval, notes reviewed antipsychotic agents: N/A 5. Neuropsych: This patient ?Fullycapable of making decisions on hisown behalf. 6. Skin/Wound Care:Routine skin checks 7. Fluids/Electrolytes/Nutrition:encourage PO  BMP within normal limits on 6/29 8. Seizure disorder. Valproate 750 mg every 8 hours.   Latest valproic acid level 33 (low) on 7/1, patient has missed several doses of medication 9. History of alcohol tobacco and marijuana use. Provide counseling as appropriate 10.  Sleep disturbance, predominantly due to psychosocial stressors  Trazodone nightly 11.  Transaminitis  LFTs elevated on 6/29, will repeat labs at the end of this week  Continue to monitor 12.  Labile blood pressure  Slightly labile on 7/1  LOS: 5 days A FACE TO FACE EVALUATION WAS PERFORMED  Walter Grimes Lorie Phenix 12/24/2018, 9:35 AM

## 2018-12-24 NOTE — Progress Notes (Signed)
Occupational Therapy Session Note  Patient Details  Name: Walter Grimes MRN: [id number] Date of Birth: 1991/04/19  1455-1555 60 min  Short Term Goals: Week 2:     Skilled Therapeutic Interventions/Progress Updates:    1:1. Pt received seated on toilet with NT in room. Pt finishes having BM and completes standing with MIN A for sit to stand and MOD A for standing balance after VC for not leaning against wall to balance for equal weight bearing. Pt completes w/c moblity to dayroom with supervision. OT installs shoe buttons and pt able to don L shoe and fasten B shoes. Pt given yellow theraputty and handout and pt able to complete with min VC. Pt completes 2x10 shoulder ab/adduction and flex/ext with min facilitation at scapula and elbow. Exited session with pt  Returned to room, call light in reach all needs met and bed alarm set  Therapy Documentation Precautions:  Precautions Precautions: Fall Restrictions Weight Bearing Restrictions: No General:   Vital Signs: Therapy Vitals Temp: (!) 97.1 F (36.2 C) Pulse Rate: 94 Resp: 18 BP: 128/81 Patient Position (if appropriate): Sitting Oxygen Therapy SpO2: 98 % O2 Device: Room Air Pain:   ADL: ADL Eating: Set up Where Assessed-Eating: Wheelchair Grooming: Minimal assistance Where Assessed-Grooming: Edge of bed Upper Body Bathing: Minimal assistance Where Assessed-Upper Body Bathing: Edge of bed Lower Body Bathing: Maximal assistance Where Assessed-Lower Body Bathing: Edge of bed Upper Body Dressing: Moderate assistance Where Assessed-Upper Body Dressing: Edge of bed Lower Body Dressing: Maximal assistance Where Assessed-Lower Body Dressing: Edge of bed Toileting: Maximal assistance Where Assessed-Toileting: Bedside Commode Toilet Transfer: Maximal assistance Toilet Transfer Method: Stand pivot Science writer: Art gallery manager    Praxis   Exercises:   Other Treatments:      Therapy/Group: Individual Therapy  Tonny Branch 12/24/2018, 3:35 PM

## 2018-12-24 NOTE — Progress Notes (Signed)
Physical Therapy Session Note  Patient Details  Name: Walter Grimes MRN: [id number] Date of Birth: 1990-09-23  Today's Date: 12/24/2018 PT Individual Time: 0908-1007 PT Individual Time Calculation (min): 59 min   Short Term Goals: Week 1:  PT Short Term Goal 1 (Week 1): Pt will maintain sitting balance on EOM/EOB without UE support with supervision PT Short Term Goal 2 (Week 1): Pt will perform bed<>chair transfers with mod assist PT Short Term Goal 3 (Week 1): Pt will ambulate 23ft using LRAD with max assist of 1 person  Skilled Therapeutic Interventions/Progress Updates:     Patient in bed upon PT arrival. Patient alert and agreeable to PT session. Patient reported R LE pain this morning that resolved by PT session. PT educated on stroke pain and edema pathology and control/managment. Patient reported that his medications make him feel dizzy and "out of it," BP 120/64 HR 105, RN made aware.   Therapeutic Activity: Bed Mobility: Patient performed supine to/from sit with supervision and min A for LE management getting in to bed with HOB elevated to 30 degrees. Provided verbal cues for R LE management with L LE assisting. Transfers: Patient performed sit to/from stand x2 without an AD, x1 with L rail, and x2 with RW with min A-CGA. Provided verbal cues for hand placement, pushing up from a solid surface and reaching back to sit for safety. He performed squat pivot transfers with min A x1 and CGA x1 with cues for safe technique and hand placement.  Gait Training:  Patient ambulated 4 steps forward and back with the RW and 20 feet using a left rail with mod A, PT assisted R LE advancement with mod A and has a tendency to adduct R LE in swing, also manual assist to R and guarded R knee with cues for quad activation for R LE in stance. Ambulated with R LE adduction in swing with inability to advance independently, R knee buckling, step-to gait pattern leading with R, L trunk lean, and decreased  weight shift to R. Provided verbal cues for erect posture, R weight shift, R LE advancement, increased BOS, and looking ahead.  Wheelchair Mobility:  Patient propelled wheelchair 150 feet x2 navigating around obstacles and a narrow doorway using L UE and LE hemi-technique. Provided verbal cues for avoiding obstacles on R, tuning technique and navigating tight spaces. Educated on removal of leg rests and use of breaks throughout session. Tends to forget breaks before transfers.   Neuromuscular Re-ed: Patient performed standing balance with min A-CGA while reaching for numbered post-it notes on the side of a standing mirror at various heights. Reaching with his R hand he took them from the right side of the mirror to the left with min A to place post-its on R. 2 trials for 4-5 min each. Focused on quad activation and weight shift to R LE and use of R UE.  Patient in bed at end of session with breaks locked, bed alarm set, and all needs within reach.    Therapy Documentation Precautions:  Precautions Precautions: Fall Restrictions Weight Bearing Restrictions: No   Pain: Patient denied pain during session.    Therapy/Group: Individual Therapy  Akaya Proffit L Kyleeann Cremeans PT, DPT  12/24/2018, 12:55 PM

## 2018-12-25 ENCOUNTER — Inpatient Hospital Stay (HOSPITAL_COMMUNITY): Payer: Self-pay | Admitting: Physical Therapy

## 2018-12-25 ENCOUNTER — Inpatient Hospital Stay (HOSPITAL_COMMUNITY): Payer: Self-pay | Admitting: Occupational Therapy

## 2018-12-25 ENCOUNTER — Inpatient Hospital Stay (HOSPITAL_COMMUNITY): Payer: Self-pay | Admitting: Speech Pathology

## 2018-12-25 MED ORDER — BACLOFEN 5 MG HALF TABLET: 5.0000 mg | ORAL_TABLET | Freq: Three times a day (TID) | ORAL | Status: DC | PRN

## 2018-12-25 NOTE — Progress Notes (Signed)
Occupational Therapy Session Note  Patient Details  Name: Walter Grimes MRN: [id number] Date of Birth: 11-Apr-1991  Today's Date: 12/25/2018 OT Individual Time: 0930-1030 OT Individual Time Calculation (min): 60 min    Short Term Goals: Week 1:  OT Short Term Goal 1 (Week 1): Pt will complete UB dressing with supervision following hemi dressing techniques for 3 consecutive sessions. OT Short Term Goal 2 (Week 1): Pt will complete LB bathing sit to stand with min assist. OT Short Term Goal 3 (Week 1): Pt will perform LB dressing with mod assist sit to stand. OT Short Term Goal 4 (Week 1): Pt will use the RUE as stabilizer for holding grooming and bathing items with min instructional cueing and supervision. OT Short Term Goal 5 (Week 1): Pt will complete toilet transfer stand pivot with mod assist.  Skilled Therapeutic Interventions/Progress Updates:     Patient seated in recliner, ready for therapy session  He is pleasant and cooperative t/o session.  SPT to/from recliner, w/c, shower bench, bed with min A, cues for R LE positioning.  Bathing completed seated on shower bench with set up/CS - he requires CS/CGA when in stance using grab bars - he is able to wash buttocks in stance w/ cues for safety.  LB/UB dressing completed w/c level with min A for underwear/pants, mod a for slipper socks, min A for OH shirt.  Grooming at sink seated mod I - able to use right hand for opening toothpaste/brushing teeth - he notes fatigue following task completion.  Patient returned to bed at close of session - bed alarm set and call bell in reach.    Therapy Documentation Precautions:  Precautions Precautions: Fall Restrictions Weight Bearing Restrictions: No General:   Vital Signs:   Pain: Pain Assessment Pain Scale: 0-10 Pain Score: 0-No pain   Other Treatments:     Therapy/Group: Individual Therapy  Carlos Levering 12/25/2018, 12:02 PM

## 2018-12-25 NOTE — Progress Notes (Signed)
Social Work Patient ID: [name], male   DOB: [birth year], 28 y.o.   MRN: [id number]   Met with pt yesterday following team conference and have left a VM for his mother.  Pt aware and agreeable with targeted d/c date of 7/18.  He is encouraged by increase in function of arm and hopeful he might exceed supervision/ min assist goals.  Have asked for his mother to call me to discuss d/c plans further.  Will continue to follow.  Isobella Ascher, LCSW

## 2018-12-25 NOTE — Progress Notes (Signed)
Occupational Therapy Weekly Progress Note  Patient Details  Name: Walter Grimes MRN: [id number] Date of Birth: 1991/04/10  Beginning of progress report period: December 20, 2018 End of progress report period: December 26, 2018  Today's Date: 12/26/2018 OT Individual Time: 0600-4599 OT Individual Time Calculation (min): 70 min    Patient has met 5 of 5 short term goals.  Pt is making excellent progress towards OT goals. Pt is at an overall mod A level for functional BADL tasks and functional transfers. R UE function continues to improve with pt able to grasp objects now with R hand and is working on improving proximal strength. Continue current POC.  Patient continues to demonstrate the following deficits: muscle weakness, abnormal tone, unbalanced muscle activation and decreased coordination and decreased sitting balance, decreased standing balance, decreased postural control, hemiplegia and decreased balance strategies and therefore will continue to benefit from skilled OT intervention to enhance overall performance with BADL.  Patient progressing toward long term goals..  Continue plan of care.  OT Short Term Goals Week 1:  OT Short Term Goal 1 (Week 1): Pt will complete UB dressing with supervision following hemi dressing techniques for 3 consecutive sessions. OT Short Term Goal 1 - Progress (Week 1): Met OT Short Term Goal 2 (Week 1): Pt will complete LB bathing sit to stand with min assist. OT Short Term Goal 2 - Progress (Week 1): Met OT Short Term Goal 3 (Week 1): Pt will perform LB dressing with mod assist sit to stand. OT Short Term Goal 3 - Progress (Week 1): Met OT Short Term Goal 4 (Week 1): Pt will use the RUE as stabilizer for holding grooming and bathing items with min instructional cueing and supervision. OT Short Term Goal 4 - Progress (Week 1): Met OT Short Term Goal 5 (Week 1): Pt will complete toilet transfer stand pivot with mod assist. OT Short Term Goal 5 - Progress (Week  1): Met Week 2:  OT Short Term Goal 1 (Week 2): Pt will complete 1 step of toileting task OT Short Term Goal 2 (Week 2): Pt will maintain standing balance within BADL tasks with consistent min A OT Short Term Goal 3 (Week 2): Pt will complete LB dressing with no more than min A OT Short Term Goal 4 (Week 2): Pt will demonstrate self ROM of R UE with min verbal cues  Skilled Therapeutic Interventions/Progress Updates:    Pt greeted in wc at the sink brushing his teeth with nurse tech present. Pt able to maintain grasp on toothbrush with R hand and brushed teeth the entire time using R hand. Pt needed min A to don shoes, but was able to use shoe button to fasten. Pt brought to therapy gym in wc. Pt brought into quadruped position on therapy mat with mod A. Worked on core strengthening and neuro re-ed of R side with weight bearing and alternating UEs to place pegs in peg board. Pt initially needed support at shoulder and elbow 2/2 proximal weakness, but with repetition, was able to turn on shoulder and extend trunk when reaching with L hand. Pt then brought into prone for isolated R shoulder ROM. Placed R UE in pillow case to reduce friction and OT facilitated shoulder ROM. Pt returned to sitting and completed peg board with focus on normal movement patterns of shoulder/elbow/wrist/hand. Stand-pivot to wc with Mod. Pt brought to [employer] arm bike. Isolated R shoulder strength, then alternated using B UEs, then only R UE. Pt propelled wc  back to room trying to use  B UEs for shoulder strengthening. Pt left seated in wc with safety belt on and needs met.   Therapy Documentation Precautions:  Precautions Precautions: Fall Restrictions Weight Bearing Restrictions: No Pain:   none/denies pain  Therapy/Group: Individual Therapy  Valma Cava 12/25/2018, 9:52 PM

## 2018-12-25 NOTE — Plan of Care (Signed)
  Problem: RH BOWEL ELIMINATION Goal: RH STG MANAGE BOWEL WITH ASSISTANCE Description: STG Manage Bowel with Assistance. Independent Outcome: Progressing   Problem: RH BLADDER ELIMINATION Goal: RH STG MANAGE BLADDER WITH ASSISTANCE Description: STG Manage Bladder With Assistance independent Outcome: Progressing   Problem: RH SAFETY Goal: RH STG ADHERE TO SAFETY PRECAUTIONS W/ASSISTANCE/DEVICE Description: STG Adhere to Safety Precautions With Assistance/Device. Mod I Outcome: Progressing   Problem: RH PAIN MANAGEMENT Goal: RH STG PAIN MANAGED AT OR BELOW PT'S PAIN GOAL Description: Less than 4 Outcome: Progressing

## 2018-12-25 NOTE — Progress Notes (Signed)
Occupational Therapy Session Note  Patient Details  Name: Walter Grimes MRN: [id number] Date of Birth: [birth year]  Today's Date: 12/25/2018 OT Individual Time: 1312-1400 OT Individual Time Calculation (min): 48 min    Short Term Goals: Week 1:  OT Short Term Goal 1 (Week 1): Pt will complete UB dressing with supervision following hemi dressing techniques for 3 consecutive sessions. OT Short Term Goal 2 (Week 1): Pt will complete LB bathing sit to stand with min assist. OT Short Term Goal 3 (Week 1): Pt will perform LB dressing with mod assist sit to stand. OT Short Term Goal 4 (Week 1): Pt will use the RUE as stabilizer for holding grooming and bathing items with min instructional cueing and supervision. OT Short Term Goal 5 (Week 1): Pt will complete toilet transfer stand pivot with mod assist.  Skilled Therapeutic Interventions/Progress Updates:    Patient seated in recliner, states that he is tired but ready for therapy session.  SPT to/from recliner, mat, w/c, bed this afternoon with min A, blocking right knee and cues for weight shift/technique.  He tolerates unsupported sitting on mat for 30 minutes with focus on core strength, UB stretching, WB, GMC and OH reach - right UE AROM within 10% of left t/o.  Visual motor activities completed - patient notes acuity deficit since hospitalization, pursuits with c/o strain & fatigue noted.  Reviewed need for resting eyes between therapy sessions as he is often on his phone and computer t/o the day.  He returned to recliner at close of session with seat alarm set and call bell in reach.    Therapy Documentation Precautions:  Precautions Precautions: Fall Restrictions Weight Bearing Restrictions: No General:   Vital Signs: Therapy Vitals Temp: 98.6 F (37 C) Temp Source: Oral Pulse Rate: 87 Resp: 14 BP: 128/81 Patient Position (if appropriate): Sitting Oxygen Therapy SpO2: 100 % O2 Device: Room Air Pain: Pain Assessment Pain  Scale: 0-10 Pain Score: 0-No pain   Other Treatments:     Therapy/Group: Individual Therapy  Carlos Levering 12/25/2018, 3:37 PM

## 2018-12-25 NOTE — Progress Notes (Signed)
Speech Language Pathology Weekly Progress and Session Note  Patient Details  Name: Walter Grimes MRN: [id number] Date of Birth: [birth year]  Beginning of progress report period: December 19, 2018 End of progress report period: December 25, 2018  Today's Date: 12/25/2018 SLP Individual Time: 7494-4967 SLP Individual Time Calculation (min): 30 min  Short Term Goals: Week 1: SLP Short Term Goal 1 (Week 1): Pt will utilize word finding strategies to cnvey semi-complex information with supervision cues. SLP Short Term Goal 1 - Progress (Week 1): Progressing toward goal SLP Short Term Goal 2 (Week 1): Pt will complete semi-complex problem solving tasks with Min A cues. SLP Short Term Goal 2 - Progress (Week 1): Progressing toward goal SLP Short Term Goal 3 (Week 1): Pt will demonstrate selective attention in moderately distracting environment for ~ 45 minutes with supervision cues. SLP Short Term Goal 3 - Progress (Week 1): Progressing toward goal SLP Short Term Goal 4 (Week 1): Pt will demonstrate intellectual awareness for cognitive deficit in 7 out of 10 opportunities with Mod A cues. SLP Short Term Goal 4 - Progress (Week 1): Progressing toward goal    New Short Term Goals: Week 2: SLP Short Term Goal 1 (Week 2): Pt will utilize word finding strategies to convey semi-complex information with supervision cues. SLP Short Term Goal 2 (Week 2): Pt will complete semi-complex problem solving tasks with Min A cues. SLP Short Term Goal 3 (Week 2): Pt will demonstrate selective attention in moderately distracting environment for ~ 45 minutes with supervision cues. SLP Short Term Goal 4 (Week 2): Pt will demonstrate intellectual awareness for cognitive deficit in 7 out of 10 opportunities with Mod A cues.  Weekly Progress Updates:   Pt has made progress towards STGs and currently at [place] with some ability to ocmplete tasks iwth decreased cues. Pt with good progress when describing physical deficits and  safety precautions within these deficits as well as demonstrating selective attention to tasks in mildly distracting environment. Pt experiences difficulty with word finding, thought organization, specificity of thoughts, shifting between topics and awareness of deficits in problem solving. Skilled ST continues to be required to targets these deficits, increase pt's functional independence and reduce caregiver burden.      Intensity: Minumum of 1-2 x/day, 30 to 90 minutes Frequency: 3 to 5 out of 7 days Duration/Length of Stay: 7/18 Treatment/Interventions: Patient/family education;Medication managment;Therapeutic Activities;Speech/Language facilitation   Daily Session  Skilled Therapeutic Interventions: Skilled treatment session focused on cognition goals. SLP facilitated session by providing Min A questions cues for thought organization and specificity of details during description task. Pt with some tangential comments that were vaguely related to description task. Pt with increased ability to selectively attend to task in mildly distracting environment, required mildly increased time to recall information related ot medication (nurse present). However pt with increased recall of medicines as well as function and he recognized shape/color of medicines in cup. Pt left upright in recliner, chair alarm on and all needs within reach. Continue per current plan of care.       General    Pain    Therapy/Group: Individual Therapy  Lindsey Hommel 12/25/2018, 8:41 AM

## 2018-12-25 NOTE — Progress Notes (Signed)
Physical Therapy Session Note  Patient Details  Name: Walter Grimes MRN: [id number] Date of Birth: 1990-09-25  Today's Date: 12/25/2018 PT Individual Time: 1515-1600 PT Individual Time Calculation (min): 45 min   Short Term Goals: Week 1:  PT Short Term Goal 1 (Week 1): Pt will maintain sitting balance on EOM/EOB without UE support with supervision PT Short Term Goal 2 (Week 1): Pt will perform bed<>chair transfers with mod assist PT Short Term Goal 3 (Week 1): Pt will ambulate 85f using LRAD with max assist of 1 person  Skilled Therapeutic Interventions/Progress Updates:   Pt received sitting in recliner and agreeable to PT. [facility]. [facility] Gait training with RW x [id number] assist from PT to advance the [name] and stabilize knee to prevent GR on first bout. Ace wrap applied to foot to sustain DF and mod assist required on 2nd and 3rd bout and mod-max multimodal cues for step length, neutral knee extension, improved heel contact and sequecning with turn to sitting.   Pt transported to day room. Mod assist stand pivot transfer to and from nustep with RW and DF wrap. BUE and BLE x 5 minutes with mod assist to improve hip alignment and promote full ROM. X 5 min BLE only with min assist on the R knee to initiate extension and sustain reciprocal movement pattern.   Pt returned to room and performed stand pivot transfer to bed with UE support on bed rail and mod assist. Sit>supine completed with min assist to control the [name]. and left supine in bed with call bell in reach and all needs met.       Therapy Documentation Precautions:  Precautions Precautions: Fall Restrictions Weight Bearing Restrictions: No Vital Signs: Therapy Vitals Temp: 98.6 F (37 C) Temp Source: Oral Pulse Rate: 87 Resp: 14 BP: 128/81 Patient Position (if appropriate): Sitting Oxygen Therapy SpO2: 100 % O2 Device: Room  Air Pain: Pain Assessment Pain Scale: 0-10 Pain Score: 0-No pain    Therapy/Group: Individual Therapy  AustLorie Phenix/2020, 5:31 PM

## 2018-12-25 NOTE — Patient Care Conference (Signed)
Inpatient RehabilitationTeam Conference and Plan of Care Update Date: 12/24/2018   Time: 2:30 PM    Patient Name: Walter Grimes      Medical Record Number: [id number]  Date of Birth: 02-Feb-1991 Sex: Male         Room/Bed: 4M11C/4M11C-01 Payor Info: Payor: MEDICAID POTENTIAL / Plan: MEDICAID POTENTIAL / Product Type: *No Product type* /    Admitting Diagnosis: 6. ABI Team L CVA, ICH, 17-19 days  Admit Date/Time:  12/19/2018  4:25 PM Admission Comments: No comment available   Primary Diagnosis:  <principal problem not specified> Principal Problem: <principal problem not specified>  Patient Active Problem List   Diagnosis Date Noted  . Labile blood pressure   . Seizures ([place])   . Transaminitis   . Sleep disturbance   . Anxiety state   . ICH (intracerebral hemorrhage) ([place]) 12/09/2018    Expected Discharge Date: Expected Discharge Date: 01/10/19  Team Members Present: Physician leading conference: Dr. 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  Improve headaches, transaminitis, BP, sleep, seizures  See above   Bowel/Bladder   continent B&B  remain continent  assess B&B q shift, prn   Swallow/Nutrition/ Hydration             ADL's   Mod A overall, some max A transfers still  Supervision/min A  R NMR, functional transfers, participation, stroke education, self-care retraining   Mobility   mod-max assist transfers, mod assist gait at rail 30', supervision w/c mobility  min assist overall, will likely upgrade to supervision for some goals  increasing self-efficacy, [name] NMR, progressing gait   Communication   Mod A for  awareness, semi-complex problem solving  Supervision  intellectual awareness, orientation   Safety/Cognition/ Behavioral Observations            Pain   c/o headache, has scheduled tylenol  maintain pain level less than 5/10  assess pain q shift, prn   Skin   no skin issues  remain free of skin issues  assess skin q shift, prn    Rehab Goals Patient on target to meet rehab goals: Yes *See Care Plan and progress notes for long and short-term goals.     Barriers to Discharge  Current Status/Progress Possible Resolutions Date Resolved   Physician    Medical stability;Medication compliance     See above  Therapies, follow labs/vitals, encourage med compliance      Nursing                  PT                    OT                  SLP   history of drug use            SW                Discharge Planning/Teaching Needs:  Pt to d/c to home of parents but is also considering home with girlfriend if possible.  Teaching to be scheduled prior to d/c.   Team Discussion:  Medically stable overall;  Had  been refusing meds initially but more agreeable now.  Cont b/b.  Mod assist with ADLs and min - supervision goals.  Mod - max tf but actually did a min a tf today.  amb ~ 1330' with rw.  May be able to upgrade some goals to supervision.  Mild cognitive deficits - ST following.  Revisions to Treatment Plan:  NA    Continued Need for Acute Rehabilitation Level of Care: The patient requires daily medical management by a physician with specialized training in physical medicine and rehabilitation for the following conditions: Daily direction of a multidisciplinary physical rehabilitation program to ensure safe treatment while eliciting the highest outcome that is of practical value to the patient.: Yes Daily medical management of patient stability for increased activity during participation in an intensive rehabilitation regime.: Yes Daily analysis of laboratory values and/or radiology reports  with any subsequent need for medication adjustment of medical intervention for : Neurological problems;Other;Blood pressure problems   I attest that I was present, lead the team conference, and concur with the assessment and plan of the team.   Alonza BogusHOYLE, Keary Hanak 12/25/2018, 2:43 PM    Team conference was held via web/ teleconference due to COVID - 19

## 2018-12-25 NOTE — Progress Notes (Signed)
[place] PHYSICAL MEDICINE & REHABILITATION PROGRESS NOTE   Subjective/Complaints: Patient seen sitting up in bed this AM.  He slept well overnight.  He is in better spirits this a.m.  He is using his right hand to feed himself.  ROS: Denies CP, SOB, N/V/D  Objective:   No results found. No results for input(s): WBC, HGB, HCT, PLT in the last 72 hours. No results for input(s): NA, K, CL, CO2, GLUCOSE, BUN, CREATININE, CALCIUM in the last 72 hours.  Intake/Output Summary (Last 24 hours) at 12/25/2018 0933 Last data filed at 12/25/2018 0750 Gross per 24 hour  Intake 1206 ml  Output 775 ml  Net 431 ml     Physical Exam: Vital Signs Blood pressure 127/85, pulse 80, temperature 98.4 F (36.9 C), temperature source Oral, resp. rate 18, height 5\' 8"  (1.727 m), weight 74.5 kg, SpO2 100 %.  Constitutional: No distress . Vital signs reviewed. HENT: Normocephalic.  Atraumatic. Eyes: EOMI.  No discharge. Cardiovascular: No JVD. Respiratory: Normal effort. GI: Non--distended. Musc: No edema or tenderness in extremities. Neurological: Alert Motor: RUE: 4-/5 shoulder abduction, 4--4/5 elbow flexion and handgrip, 4-/5 elbow extension, wrist extension, improving.  [name]: 3/5 hip flexion, knee extension, 0/5 ankle dorsiflexion, improving Skin: Numerous tattoos. Psychiatric: Flat, improving  Assessment/Plan: 1. 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  Physiatrist is providing close team supervision and 24 hour management of active medical problems listed below.  Physiatrist and rehab team continue to assess barriers to discharge/monitor patient progress toward functional and medical goals  Care Tool:  Bathing    Body parts bathed by patient: Right arm, Chest, Abdomen, Right upper leg, Left upper leg, Left lower leg, Face   Body parts bathed by helper: Front  perineal area, Buttocks, Left arm, Right lower leg     Bathing assist Assist Level: Maximal Assistance - Patient 24 - 49%     Upper Body Dressing/Undressing Upper body dressing   What is the patient wearing?: Pull over shirt    Upper body assist Assist Level: Moderate Assistance - Patient 50 - 74%    Lower Body Dressing/Undressing Lower body dressing      What is the patient wearing?: Underwear/pull up     Lower body assist Assist for lower body dressing: Contact Guard/Touching assist     Toileting Toileting    Toileting assist Assist for toileting: Moderate Assistance - Patient 50 - 74% Assistive Device Comment: urinal   Transfers Chair/bed transfer  Transfers assist     Chair/bed transfer assist level: Minimal Assistance - Patient > 75%     Locomotion Ambulation   Ambulation assist      Assist level: Moderate Assistance - Patient 50 - 74% Assistive device: Other (comment)(L rail) Max distance: 20'   Walk 10 feet activity   Assist  Walk 10 feet activity did not occur: Safety/medical concerns  Assist level: Moderate Assistance - Patient - 50 - 74% Assistive device: Other (comment)(L rail)   Walk 50 feet activity   Assist Walk 50 feet with 2 turns activity did not occur: Safety/medical concerns         Walk 150 feet activity   Assist Walk 150 feet activity did not occur: Safety/medical concerns         Walk 10 feet on uneven surface  activity   Assist Walk 10 feet on uneven surfaces activity did not occur: Safety/medical concerns  Wheelchair     Assist Will patient use wheelchair at discharge?: Yes Type of Wheelchair: Manual Wheelchair activity did not occur: Safety/medical concerns(unable due to pt fatigue)  Wheelchair assist level: Supervision/Verbal cueing, Set up assist Max wheelchair distance: 150'    Wheelchair 50 feet with 2 turns activity    Assist        Assist Level: Supervision/Verbal cueing    Wheelchair 150 feet activity     Assist     Assist Level: Supervision/Verbal cueing     Medical Problem List and Plan: 1.Cognitive deficits with decreased functional mobilitysecondary toleft parietal frontal ICH with IVH  Continue CIR  -[name] [name] 2. Antithrombotics: -DVT/anticoagulation:Subcutaneous heparin initiated 12/12/2018 -antiplatelet therapy: N/A 3. Pain Management: Tramadol DC'd  Tylenol scheduled-improvement in headaches  Low dose baclofen started on 7/1, changed to PRN on 7/2  Fioricet as needed for headache 4. Mood:Xanax 0.25 mg 3 times daily for anxiety, appears improved this morning  team will need to provide regular reassurance  Appreciate neuropsych eval, notes reviewed antipsychotic agents: N/A 5. Neuropsych: This patient ?Fullycapable of making decisions on hisown behalf. 6. Skin/Wound Care:Routine skin checks 7. Fluids/Electrolytes/Nutrition:encourage PO  BMP within normal limits on 6/29 8. Seizure disorder. Valproate 750 mg every 8 hours.   Latest valproic acid level 33 (low) on 7/1, patient has missed several doses of medication 9. History of alcohol tobacco and marijuana use. Provide counseling as appropriate 10.  Sleep disturbance, predominantly due to psychosocial stressors  Trazodone nightly  Improving overall 11.  Transaminitis  LFTs elevated on 6/29, labs ordered for tomorrow  Continue to monitor 12.  Labile blood pressure  Controlled on 7/2  LOS: 6 days A FACE TO FACE EVALUATION WAS PERFORMED  Sueko Dimichele Karis Jubanil Johnetta Sloniker 12/25/2018, 9:33 AM

## 2018-12-26 ENCOUNTER — Inpatient Hospital Stay (HOSPITAL_COMMUNITY): Payer: Self-pay | Admitting: Physical Therapy

## 2018-12-26 ENCOUNTER — Inpatient Hospital Stay (HOSPITAL_COMMUNITY): Payer: Self-pay | Admitting: Occupational Therapy

## 2018-12-26 ENCOUNTER — Inpatient Hospital Stay (HOSPITAL_COMMUNITY): Payer: Self-pay | Admitting: Speech Pathology

## 2018-12-26 LAB — HEPATIC FUNCTION PANEL
ALT: 80 U/L — ABNORMAL HIGH (ref 0–44)
AST: 50 U/L — ABNORMAL HIGH (ref 15–41)
Albumin: 3.5 g/dL (ref 3.5–5.0)
Alkaline Phosphatase: 62 U/L (ref 38–126)
Bilirubin, Direct: 0.1 mg/dL (ref 0.0–0.2)
Indirect Bilirubin: 0.4 mg/dL (ref 0.3–0.9)
Total Bilirubin: 0.5 mg/dL (ref 0.3–1.2)
Total Protein: 6.6 g/dL (ref 6.5–8.1)

## 2018-12-26 NOTE — Progress Notes (Signed)
Patient slept fairly well throughout the night. Expresses feeling down due to son's birthday being today and desire to go home. Emotional support and reassurance provided. Pt encouraged to continue to work on progression in therapy. Refused scheduled trazodone at hs.

## 2018-12-26 NOTE — Plan of Care (Signed)
  Problem: RH BOWEL ELIMINATION Goal: RH STG MANAGE BOWEL WITH ASSISTANCE Description: STG Manage Bowel with Assistance. Independent Outcome: Progressing   Problem: RH BLADDER ELIMINATION Goal: RH STG MANAGE BLADDER WITH ASSISTANCE Description: STG Manage Bladder With Assistance independent Outcome: Progressing   Problem: RH SAFETY Goal: RH STG ADHERE TO SAFETY PRECAUTIONS W/ASSISTANCE/DEVICE Description: STG Adhere to Safety Precautions With Assistance/Device. Mod I Outcome: Progressing   Problem: RH PAIN MANAGEMENT Goal: RH STG PAIN MANAGED AT OR BELOW PT'S PAIN GOAL Description: Less than 4 Outcome: Progressing   

## 2018-12-26 NOTE — Progress Notes (Addendum)
Speech Language Pathology Daily Session Note  Patient Details  Name: Walter Grimes MRN: [id number] Date of Birth: 1991-01-16  Today's Date: 12/26/2018 SLP Individual Time: 6144-3154 SLP Individual Time Calculation (min): 30 min  Short Term Goals: Week 2: SLP Short Term Goal 1 (Week 2): Pt will utilize word finding strategies to convey semi-complex information with supervision cues. SLP Short Term Goal 2 (Week 2): Pt will complete semi-complex problem solving tasks with Min A cues. SLP Short Term Goal 3 (Week 2): Pt will demonstrate selective attention in moderately distracting environment for ~ 45 minutes with supervision cues. SLP Short Term Goal 4 (Week 2): Pt will demonstrate intellectual awareness for cognitive deficit in 7 out of 10 opportunities with Mod A cues.  Skilled Therapeutic Interventions: Patient received skilled SLP services targeting cognitive goals. Patient responded to functional problem solving task for his [employer] pizza order requiring min assist for completion. Patient required redirection to conversation during therapy with patient demonstrating difficulty with topic maintenance. Patient required mod verbal cues to identify 3 word finding strategies and implement them during conversational speech task. Patient demonstrated no awareness to cognitive deficits stating "my memory is great for everything."  Pain  0/10 FACES  Therapy/Group: Individual Therapy  Cristy Folks 12/26/2018, 12:34 PM

## 2018-12-26 NOTE — Progress Notes (Signed)
Physical Therapy Session Note  Patient Details  Name: Walter Grimes MRN: [id number] Date of Birth: 14-Apr-1991  Today's Date: 12/26/2018 PT Individual Time: 1415-1530 PT Individual Time Calculation (min): 75 min   Short Term Goals: Week 1:  PT Short Term Goal 1 (Week 1): Pt will maintain sitting balance on EOM/EOB without UE support with supervision PT Short Term Goal 2 (Week 1): Pt will perform bed<>chair transfers with mod assist PT Short Term Goal 3 (Week 1): Pt will ambulate 2ft using LRAD with max assist of 1 person  Skilled Therapeutic Interventions/Progress Updates:    Pt received seated in w/c in hallway heading down towards therapy gym. Pt is Supervision for safe w/c mobility x 150 ft with use of L UE/LE for propulsion. Squat pivot transfer w/c to mat table with min A to the L. Sit to stand with min A to RW. Standing R knee flex/ext with manual cues for control x 10 reps. Standing mini-squats x 10 reps with manual cueing for R knee control and timing of contraction as pt tends to flex L knee prior to flexing R knee. Sit to stand x 5 reps from low mat table to RW with min A with focus on anterior weight shift and safe body placement for transfer. Sit to stand x 5 reps from low mat table with no AD and min A with focus on eccentric control with sitting. Standing balance with no AD reaching outside BOS with RUE for horseshoes then tossing onto ring with min A to maintain standing balance. Forward/backward stepping with [name] with min to mod A to advance limb with manual cueing for hip and knee control. Ambulation 2 x 15', 1 x 40' with RW and mod A for balance and to advance [name] with DF assist wrap on [name]. Pt exhibits improved foot clearance with use of ACE wrap but exhibits significant knee hyperextension with gait. Trialed anterior support AFO with gait, pt continues to exhibit hyperextension. Will continue to assess bracing needs. Pt requests to return to bed at end of session. Squat pivot  transfer w/c to bed with min A. Sit to supine CGA. Pt left semi-reclined in bed with needs in reach, bed alarm in place.  Therapy Documentation Precautions:  Precautions Precautions: Fall Restrictions Weight Bearing Restrictions: No    Therapy/Group: Individual Therapy   Excell Seltzer, PT, DPT  12/26/2018, 3:44 PM

## 2018-12-26 NOTE — Progress Notes (Signed)
[place] PHYSICAL MEDICINE & REHABILITATION PROGRESS NOTE   Subjective/Complaints: Patient seen laying in bed this AM.  He states he slept fairly overnight due to "a lot on my mind".  ROS: Denies CP, SOB, N/V/D  Objective:   No results found. No results for input(s): WBC, HGB, HCT, PLT in the last 72 hours. No results for input(s): NA, K, CL, CO2, GLUCOSE, BUN, CREATININE, CALCIUM in the last 72 hours.  Intake/Output Summary (Last 24 hours) at 12/26/2018 0948 Last data filed at 12/26/2018 0801 Gross per 24 hour  Intake 600 ml  Output 750 ml  Net -150 ml     Physical Exam: Vital Signs Blood pressure 111/63, pulse 94, temperature 97.7 F (36.5 C), temperature source Oral, resp. rate 15, height 5\' 8"  (1.727 m), weight 74.5 kg, SpO2 98 %.  Constitutional: No distress . Vital signs reviewed. HENT: Normocephalic.  Atraumatic. Eyes: EOMI.  No discharge. Cardiovascular: No JVD. Respiratory: Normal effort. GI: Non-distended. Musc: No edema or tenderness in extremities. Neurological: Alert Motor: RUE: 4-/5 shoulder abduction, 4--4/5 elbow flexion and handgrip, 4-/5 elbow extension, wrist extension, improving.  [name]: 3/5 hip flexion, knee extension, 0/5 ankle dorsiflexion, improving Skin: Numerous tattoos. Psychiatric: Flat, improving  Assessment/Plan: 1. 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  Physiatrist is providing close team supervision and 24 hour management of active medical problems listed below.  Physiatrist and rehab team continue to assess barriers to discharge/monitor patient progress toward functional and medical goals  Care Tool:  Bathing    Body parts bathed by patient: Right arm, Chest, Abdomen, Right upper leg, Left upper leg, Left lower leg, Face, Left arm, Front perineal area, Right lower leg, Buttocks   Body parts bathed by helper:  Front perineal area, Buttocks, Left arm, Right lower leg     Bathing assist Assist Level: Supervision/Verbal cueing     Upper Body Dressing/Undressing Upper body dressing   What is the patient wearing?: Pull over shirt    Upper body assist Assist Level: Minimal Assistance - Patient > 75%    Lower Body Dressing/Undressing Lower body dressing      What is the patient wearing?: Underwear/pull up, Pants     Lower body assist Assist for lower body dressing: Minimal Assistance - Patient > 75%     Toileting Toileting    Toileting assist Assist for toileting: Moderate Assistance - Patient 50 - 74% Assistive Device Comment: urinal   Transfers Chair/bed transfer  Transfers assist     Chair/bed transfer assist level: Moderate Assistance - Patient 50 - 74%     Locomotion Ambulation   Ambulation assist      Assist level: Moderate Assistance - Patient 50 - 74% Assistive device: Walker-rolling Max distance: 40   Walk 10 feet activity   Assist  Walk 10 feet activity did not occur: Safety/medical concerns  Assist level: Moderate Assistance - Patient - 50 - 74% Assistive device: Walker-rolling   Walk 50 feet activity   Assist Walk 50 feet with 2 turns activity did not occur: Safety/medical concerns         Walk 150 feet activity   Assist Walk 150 feet activity did not occur: Safety/medical concerns         Walk 10 feet on uneven surface  activity   Assist Walk 10 feet on uneven surfaces activity did not occur: Safety/medical concerns  Wheelchair     Assist Will patient use wheelchair at discharge?: Yes Type of Wheelchair: Manual Wheelchair activity did not occur: Safety/medical concerns(unable due to pt fatigue)  Wheelchair assist level: Supervision/Verbal cueing, Set up assist Max wheelchair distance: 150'    Wheelchair 50 feet with 2 turns activity    Assist        Assist Level: Supervision/Verbal cueing    Wheelchair 150 feet activity     Assist     Assist Level: Supervision/Verbal cueing     Medical Problem List and Plan: 1.Cognitive deficits with decreased functional mobilitysecondary toleft parietal frontal ICH with IVH  Continue CIR  -[name] [name] 2. Antithrombotics: -DVT/anticoagulation:Subcutaneous heparin initiated 12/12/2018 -antiplatelet therapy: N/A 3. Pain Management: Tramadol DC'd  Tylenol scheduled-improvement in headaches  Low dose baclofen started on 7/1, changed to PRN on 7/2  Fioricet as needed for headache 4. Mood:Xanax 0.25 mg 3 times daily for anxiety, appears improved this morning  team will need to provide regular reassurance  Appreciate neuropsych eval, notes reviewed antipsychotic agents: N/A 5. Neuropsych: This patient ?Fullycapable of making decisions on hisown behalf. 6. Skin/Wound Care:Routine skin checks 7. Fluids/Electrolytes/Nutrition:encourage PO  BMP within normal limits on 6/29, labs ordered for Monday 8. Seizure disorder. Valproate 750 mg every 8 hours.   Latest valproic acid level 33 (low) on 7/1, patient has missed several doses of medication, compliance appears to be improving  Repeat lab ordered for Monday 9. History of alcohol tobacco and marijuana use. Provide counseling as appropriate 10.  Sleep disturbance, predominantly due to psychosocial stressors  Trazodone nightly  Improving overall 11.  Transaminitis  LFTs elevated on 7/3  Continue to monitor 12.  Labile blood pressure  Controlled on 7/3  LOS: 7 days A FACE TO FACE EVALUATION WAS PERFORMED   Lorie Phenix 12/26/2018, 9:48 AM

## 2018-12-27 ENCOUNTER — Inpatient Hospital Stay (HOSPITAL_COMMUNITY): Payer: Self-pay | Admitting: Occupational Therapy

## 2018-12-27 DIAGNOSIS — F329 Major depressive disorder, single episode, unspecified: Secondary | ICD-10-CM

## 2018-12-27 NOTE — Progress Notes (Signed)
Occupational Therapy Session Note  Patient Details  Name: Walter Grimes MRN: [id number] Date of Birth: 03-06-1991  Today's Date: 12/27/2018 OT Individual Time: 1415-1500 OT Individual Time Calculation (min): 45 min   Skilled Therapeutic Interventions/Progress Updates:    Pt greeted in w/c with no c/o pain. ADL needs met, wanting to work on his R LE. Started session with completing mini squats at the sink, 10 reps 2 sets. During 2nd set, had pt maintain squatted position for 2 second holds. He required manual facilitation for increasing Rt weight shift and to stabilize Rt knee when it buckled. Pt pushing up through R UE on sink counter for NMR. Heel slides with towel completed x5 reps with Max A to flex Rt knee. Pt able to extend knee on his own. Transitioned to small pedal bike for LE strengthening. Removed his Rt shoe and strapped affected foot to pedal using ACE wrap. Pt able to complete 1 forward rotation for 4 minutes while OT stabilized affected LE. He then completed squat pivot<bed with steady assist and vcs for safe w/c setup. Pt returned to supine and was left with all needs within reach. Bed alarm set.   Therapy Documentation Precautions:  Precautions Precautions: Fall Restrictions Weight Bearing Restrictions: No Vital Signs: Therapy Vitals Temp: 98.2 F (36.8 C) Temp Source: Oral Pulse Rate: 98 Resp: 18 BP: 134/84 Patient Position (if appropriate): Sitting Oxygen Therapy SpO2: 100 % O2 Device: Room Air ADL: ADL Eating: Set up Where Assessed-Eating: Wheelchair Grooming: Minimal assistance Where Assessed-Grooming: Edge of bed Upper Body Bathing: Minimal assistance Where Assessed-Upper Body Bathing: Edge of bed Lower Body Bathing: Maximal assistance Where Assessed-Lower Body Bathing: Edge of bed Upper Body Dressing: Moderate assistance Where Assessed-Upper Body Dressing: Edge of bed Lower Body Dressing: Maximal assistance Where Assessed-Lower Body Dressing: Edge of  bed Toileting: Maximal assistance Where Assessed-Toileting: Bedside Commode Toilet Transfer: Maximal assistance Toilet Transfer Method: Stand pivot Toilet Transfer Equipment: Bedside commode      Therapy/Group: Individual Therapy  Walter Grimes A Walter Grimes 12/27/2018, 4:57 PM

## 2018-12-27 NOTE — Progress Notes (Signed)
[place] PHYSICAL MEDICINE & REHABILITATION PROGRESS NOTE   Subjective/Complaints: Patient seen laying in bed this AM, talking on his phone.  He states he slept fairly overnight.   ROS: Denies CP, SOB, N/V/D  Objective:   No results found. No results for input(s): WBC, HGB, HCT, PLT in the last 72 hours. No results for input(s): NA, K, CL, CO2, GLUCOSE, BUN, CREATININE, CALCIUM in the last 72 hours.  Intake/Output Summary (Last 24 hours) at 12/27/2018 1540 Last data filed at 12/27/2018 1300 Gross per 24 hour  Intake 360 ml  Output 600 ml  Net -240 ml     Physical Exam: Vital Signs Blood pressure 134/84, pulse 98, temperature 98.2 F (36.8 C), temperature source Oral, resp. rate 18, height 5\' 8"  (1.727 m), weight 74.5 kg, SpO2 100 %.  Constitutional: NAD. Vital signs reviewed.  HENT: Normocephalic. Atraumatic.  Eyes: EOMI. No discharge. Cardiovascular: No JVD. Respiratory: Normal effort. GI: Non-distended. Musc: No edema or tenderness in extremities. Neurological: Alert  Motor: RUE: 4-/5 shoulder abduction, 4--4/5 elbow flexion and handgrip, 4-/5 elbow extension, wrist extension, improving  [name]: 3/5 hip flexion, knee extension, 2-/5 ankle dorsiflexion Skin: Numerous tattoos. Psychiatric: Flat, improving  Assessment/Plan: 1. 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  Physiatrist is providing close team supervision and 24 hour management of active medical problems listed below.  Physiatrist and rehab team continue to assess barriers to discharge/monitor patient progress toward functional and medical goals  Care Tool:  Bathing    Body parts bathed by patient: Right arm, Chest, Abdomen, Right upper leg, Left upper leg, Left lower leg, Face, Left arm, Front perineal area, Right lower leg, Buttocks   Body parts bathed by helper: Front perineal area,  Buttocks, Left arm, Right lower leg     Bathing assist Assist Level: Supervision/Verbal cueing     Upper Body Dressing/Undressing Upper body dressing   What is the patient wearing?: Pull over shirt    Upper body assist Assist Level: Minimal Assistance - Patient > 75%    Lower Body Dressing/Undressing Lower body dressing      What is the patient wearing?: Underwear/pull up, Pants     Lower body assist Assist for lower body dressing: Minimal Assistance - Patient > 75%     Toileting Toileting    Toileting assist Assist for toileting: Moderate Assistance - Patient 50 - 74% Assistive Device Comment: urinal   Transfers Chair/bed transfer  Transfers assist     Chair/bed transfer assist level: Minimal Assistance - Patient > 75%     Locomotion Ambulation   Ambulation assist      Assist level: Moderate Assistance - Patient 50 - 74% Assistive device: Walker-rolling Max distance: 40   Walk 10 feet activity   Assist  Walk 10 feet activity did not occur: Safety/medical concerns  Assist level: Moderate Assistance - Patient - 50 - 74% Assistive device: Walker-rolling   Walk 50 feet activity   Assist Walk 50 feet with 2 turns activity did not occur: Safety/medical concerns         Walk 150 feet activity   Assist Walk 150 feet activity did not occur: Safety/medical concerns         Walk 10 feet on uneven surface  activity   Assist Walk 10 feet on uneven surfaces activity did not occur: Safety/medical concerns         Wheelchair  Assist Will patient use wheelchair at discharge?: Yes Type of Wheelchair: Manual Wheelchair activity did not occur: Safety/medical concerns(unable due to pt fatigue)  Wheelchair assist level: Supervision/Verbal cueing Max wheelchair distance: 150'    Wheelchair 50 feet with 2 turns activity    Assist        Assist Level: Supervision/Verbal cueing   Wheelchair 150 feet activity     Assist      Assist Level: Supervision/Verbal cueing     Medical Problem List and Plan: 1.Cognitive deficits with decreased functional mobilitysecondary toleft parietal frontal ICH with IVH  Continue CIR  -[name] [name] 2. Antithrombotics: -DVT/anticoagulation:Subcutaneous heparin initiated 12/12/2018 -antiplatelet therapy: N/A 3. Pain Management: Tramadol DC'd  Tylenol scheduled-improvement in headaches  Low dose baclofen started on 7/1, changed to PRN on 7/2  Fioricet as needed for headache 4. Mood:Xanax 0.25 mg 3 times daily for anxiety, relatively controlled  Appears depressed, but does not want medications  team will need to provide regular reassurance  Appreciate neuropsych eval, notes reviewed antipsychotic agents: N/A 5. Neuropsych: This patient ?Fullycapable of making decisions on hisown behalf. 6. Skin/Wound Care:Routine skin checks 7. Fluids/Electrolytes/Nutrition:encourage PO  BMP within normal limits on 6/29, labs ordered for Monday 8. Seizure disorder. Valproate 750 mg every 8 hours.   Latest valproic acid level 33 (low) on 7/1, patient has missed several doses of medication, compliance appears to be improving  Repeat lab ordered for Monday 9. History of alcohol tobacco and marijuana use. Provide counseling as appropriate 10.  Sleep disturbance, predominantly due to psychosocial stressors  Trazodone nightly  Improving overall 11.  Transaminitis  LFTs elevated on 7/3  Continue to monitor 12.  Labile blood pressure  Controlled on 7/4  LOS: 8 days A FACE TO FACE EVALUATION WAS PERFORMED  Ankit Karis Jubanil Patel 12/27/2018, 3:40 PM

## 2018-12-27 NOTE — Plan of Care (Signed)
  Problem: RH BOWEL ELIMINATION Goal: RH STG MANAGE BOWEL WITH ASSISTANCE Description: STG Manage Bowel with Assistance. Independent Outcome: Progressing   Problem: RH BLADDER ELIMINATION Goal: RH STG MANAGE BLADDER WITH ASSISTANCE Description: STG Manage Bladder With Assistance independent Outcome: Progressing   Problem: RH SAFETY Goal: RH STG ADHERE TO SAFETY PRECAUTIONS W/ASSISTANCE/DEVICE Description: STG Adhere to Safety Precautions With Assistance/Device. Mod I Outcome: Progressing   Problem: RH PAIN MANAGEMENT Goal: RH STG PAIN MANAGED AT OR BELOW PT'S PAIN GOAL Description: Less than 4 Outcome: Progressing   

## 2018-12-28 ENCOUNTER — Inpatient Hospital Stay (HOSPITAL_COMMUNITY): Payer: Self-pay

## 2018-12-28 DIAGNOSIS — I69319 Unspecified symptoms and signs involving cognitive functions following cerebral infarction: Secondary | ICD-10-CM

## 2018-12-28 NOTE — Progress Notes (Signed)
Occupational Therapy Session Note  Patient Details  Name: Walter Grimes MRN: [id number] Date of Birth: [birth year]  Today's Date: 12/28/2018 OT Individual Time: 1300-1400 OT Individual Time Calculation (min): 60 min    Short Term Goals: Week 2:  OT Short Term Goal 1 (Week 2): Pt will complete 1 step of toileting task OT Short Term Goal 2 (Week 2): Pt will maintain standing balance within BADL tasks with consistent min A OT Short Term Goal 3 (Week 2): Pt will complete LB dressing with no more than min A OT Short Term Goal 4 (Week 2): Pt will demonstrate self ROM of R UE with min verbal cues  Skilled Therapeutic Interventions/Progress Updates:    Pt received in w/c with no c/o pain, flat affect and willing to participate in therapy. Pt completed w/c propulsion around room using hemi method to gather clothes for shower. Pt demonstrating poor safety awareness with transfers throughout session, sitting prematurely and initiating transfers before chair was locked and in position. Pt completed stand pivot transfer with mod cueing into shower with TTB, requiring min A overall to correct balance. Pt completed all bathing sit <> stand with close (S). Pt used TTB and wall to stabilize balance while standing, especially when removing LUE from grab bar. Pt transferred out of shower and back to w/c. He donned shirt with min balance assistance in standing, but on assistance to don shirt. Min cueing required for hemi technique to don pants. Pt donned shoes with shoe buttons. Removed cushion from pt's chair to increase ease with propelling with LLE. Pt was left sitting up with all needs met, chair alarm set.   Therapy Documentation Precautions:  Precautions Precautions: Fall Restrictions Weight Bearing Restrictions: No   Therapy/Group: Individual Therapy  Curtis Sites 12/28/2018, 1:33 PM

## 2018-12-28 NOTE — Progress Notes (Signed)
[facility] PHYSICAL MEDICINE & REHABILITATION PROGRESS NOTE   Subjective/Complaints: Patient seen laying in bed this morning.  He states he slept well overnight.  He denies complaints.  ROS: Denies CP, SOB, N/V/D  Objective:   No results found. No results for input(s): WBC, HGB, HCT, PLT in the last 72 hours. No results for input(s): NA, K, CL, CO2, GLUCOSE, BUN, CREATININE, CALCIUM in the last 72 hours.  Intake/Output Summary (Last 24 hours) at 12/28/2018 1156 Last data filed at 12/28/2018 0748 Gross per 24 hour  Intake 720 ml  Output 550 ml  Net 170 ml     Physical Exam: Vital Signs Blood pressure 127/80, pulse 70, temperature 97.7 F (36.5 C), temperature source Oral, resp. rate 18, height 5\' 8"  (1.727 m), weight 74.5 kg, SpO2 100 %.  Constitutional: NAD. Vital signs reviewed.  HENT: Normocephalic.  Atraumatic. Eyes: EOMI.  No discharge. Cardiovascular: No JVD. Respiratory: Normal effort. GI: Non-distended. Musc: No edema or tenderness in extremities. Neurological: Alert  Motor: RUE: 4-/5 shoulder abduction, 4--4/5 elbow flexion and handgrip, 4-/5 elbow extension, wrist extension, stable [name]: 3/5 hip flexion, knee extension, 2-/5 ankle dorsiflexion, stable Skin: Numerous tattoos. Psychiatric: Flat, improving  Assessment/Plan: 1. 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  Physiatrist is providing close team supervision and 24 hour management of active medical problems listed below.  Physiatrist and rehab team continue to assess barriers to discharge/monitor patient progress toward functional and medical goals  Care Tool:  Bathing    Body parts bathed by patient: Right arm, Chest, Abdomen, Right upper leg, Left upper leg, Left lower leg, Face, Left arm, Front perineal area, Right lower leg, Buttocks   Body parts bathed by helper: Front perineal  area, Buttocks, Left arm, Right lower leg     Bathing assist Assist Level: Supervision/Verbal cueing     Upper Body Dressing/Undressing Upper body dressing   What is the patient wearing?: Pull over shirt    Upper body assist Assist Level: Minimal Assistance - Patient > 75%    Lower Body Dressing/Undressing Lower body dressing      What is the patient wearing?: Underwear/pull up, Pants     Lower body assist Assist for lower body dressing: Minimal Assistance - Patient > 75%     Toileting Toileting    Toileting assist Assist for toileting: Moderate Assistance - Patient 50 - 74% Assistive Device Comment: urinal   Transfers Chair/bed transfer  Transfers assist     Chair/bed transfer assist level: Minimal Assistance - Patient > 75%     Locomotion Ambulation   Ambulation assist      Assist level: Moderate Assistance - Patient 50 - 74% Assistive device: Walker-rolling Max distance: 40   Walk 10 feet activity   Assist  Walk 10 feet activity did not occur: Safety/medical concerns  Assist level: Moderate Assistance - Patient - 50 - 74% Assistive device: Walker-rolling   Walk 50 feet activity   Assist Walk 50 feet with 2 turns activity did not occur: Safety/medical concerns         Walk 150 feet activity   Assist Walk 150 feet activity did not occur: Safety/medical concerns         Walk 10 feet on uneven surface  activity   Assist Walk 10 feet on uneven surfaces activity did not occur: Safety/medical concerns         Wheelchair  Assist Will patient use wheelchair at discharge?: Yes Type of Wheelchair: Manual Wheelchair activity did not occur: Safety/medical concerns(unable due to pt fatigue)  Wheelchair assist level: Supervision/Verbal cueing Max wheelchair distance: 150'    Wheelchair 50 feet with 2 turns activity    Assist        Assist Level: Supervision/Verbal cueing   Wheelchair 150 feet activity      Assist     Assist Level: Supervision/Verbal cueing     Medical Problem List and Plan: 1.Cognitive deficits with decreased functional mobilitysecondary toleft parietal frontal ICH with IVH  Continue CIR  [name] [name] 2. Antithrombotics: -DVT/anticoagulation:Subcutaneous heparin initiated 12/12/2018 -antiplatelet therapy: N/A 3. Pain Management: Tramadol DC'd  Tylenol scheduled-improvement in headaches  Low dose baclofen started on 7/1, changed to PRN on 7/2  Fioricet as needed for headache 4. Mood:Xanax 0.25 mg 3 times daily for anxiety, relatively controlled  Appears depressed, but does not want medications, stable  team will need to provide regular reassurance  Appreciate neuropsych eval, notes reviewed antipsychotic agents: N/A 5. Neuropsych: This patient ?Fullycapable of making decisions on hisown behalf. 6. Skin/Wound Care:Routine skin checks 7. Fluids/Electrolytes/Nutrition:encourage PO  BMP within normal limits on 6/29, labs ordered for tomorrow 8. Seizure disorder. Valproate 750 mg every 8 hours.   Latest valproic acid level 33 (low) on 7/1, patient has missed several doses of medication, compliance appears to be improving  Repeat lab ordered for tomorrow 9. History of alcohol tobacco and marijuana use. Provide counseling as appropriate 10.  Sleep disturbance, predominantly due to psychosocial stressors  Trazodone nightly  Improving overall 11.  Transaminitis  LFTs elevated on 7/3  Continue to monitor  LOS: 9 days A FACE TO FACE EVALUATION WAS PERFORMED  Natalio Salois Karis Jubanil Tahjae Durr 12/28/2018, 11:56 AM

## 2018-12-28 NOTE — Progress Notes (Signed)
Physical Therapy Session Note  Patient Details  Name: Walter Grimes MRN: [id number] Date of Birth: [birth year]  Today's Date: 12/28/2018 PT Individual Time: 1025-1110 PT Individual Time Calculation (min): 45 min   Short Term Goals: Week 2:  PT Short Term Goal 1 (Week 1): Pt will maintain sitting balance on EOM/EOB without UE support with supervision PT Short Term Goal 2 (Week 1): Pt will perform bed<>chair transfers with mod assist PT Short Term Goal 3 (Week 1): Pt will ambulate 78ft using LRAD with max assist of 1 person  Skilled Therapeutic Interventions/Progress Updates:    Patient in supine and awoke from nap.  Eager to participate, but still waking up.  Supine to sit with S and sitting balance with S.  Assist to don shoe on R, pt able to don on L seated EOB.  Performed pivot transfer to w/c no AD with close S after w/c set up.  Patient in w/c propelled to therapy gym x 200' with S combo of bilat UE and L LE and L UE/LE.  Applied ace wrap to R foot and pt ambulated with RW and mod A 40' x 2.  Initially assist to limit R knee hyperextension in stance and with facilitation for hip extension and tall posture on R.  Also with increased time for progressing R LE and occasional scissoring.  Patient educated on hip position and affect it has on knee.  Second bout of ambulation pt demonstrated decreased knee hyperextension when able to facilitate increased hip extension and anterior hip positioning.  Patient sit<>stand from mat with forced use R LE (L foot on block and both hands on R knee).  Initially mod to max A to initiate then progressed to less support.  Patient stand step with L for increased R weight shift with chair vs HHA vs both for support.  Assisted in w/c back to room and pt left up in w/c with alarm belt and call bell in reach.   Therapy Documentation Precautions:  Precautions Precautions: Fall Restrictions Weight Bearing Restrictions: No    Therapy/Group: Individual  Therapy  Reginia Naas  [place], PT 12/28/2018, 4:05 PM

## 2018-12-29 ENCOUNTER — Inpatient Hospital Stay (HOSPITAL_COMMUNITY): Payer: Self-pay | Admitting: Physical Therapy

## 2018-12-29 ENCOUNTER — Inpatient Hospital Stay (HOSPITAL_COMMUNITY): Payer: Self-pay | Admitting: Occupational Therapy

## 2018-12-29 ENCOUNTER — Inpatient Hospital Stay (HOSPITAL_COMMUNITY): Payer: Self-pay | Admitting: Speech Pathology

## 2018-12-29 DIAGNOSIS — I619 Nontraumatic intracerebral hemorrhage, unspecified: Secondary | ICD-10-CM

## 2018-12-29 LAB — BASIC METABOLIC PANEL
Anion gap: 9 (ref 5–15)
BUN: 11 mg/dL (ref 6–20)
CO2: 26 mmol/L (ref 22–32)
Calcium: 9.4 mg/dL (ref 8.9–10.3)
Chloride: 104 mmol/L (ref 98–111)
Creatinine, Ser: 0.86 mg/dL (ref 0.61–1.24)
GFR calc Af Amer: >60 mL/min (ref >60)
GFR calc non Af Amer: >60 mL/min (ref >60)
Glucose, Bld: 90 mg/dL (ref 70–99)
Potassium: 3.9 mmol/L (ref 3.5–5.1)
Sodium: 139 mmol/L (ref 135–145)

## 2018-12-29 LAB — VALPROIC ACID LEVEL: Valproic Acid Lvl: 84 ug/mL (ref 50.0–100.0)

## 2018-12-29 NOTE — Plan of Care (Signed)
  Problem: RH Balance Goal: LTG Patient will maintain dynamic standing balance (PT) Description: LTG:  Patient will maintain dynamic standing balance with assistance during mobility activities (PT) Problem: Sit to Stand Goal: LTG:  Patient will perform sit to stand with assistance level (PT) Description: LTG:  Patient will perform sit to stand with assistance level (PT) Flowsheets (Taken 12/29/2018 0755) LTG: PT will perform sit to stand in preparation for functional mobility with assistance level: (upgraded 7/6 due to pt progress) Supervision/Verbal cueing Note: Upgraded 7/6 due to pt progress    Problem: RH Bed to Chair Transfers Goal: LTG Patient will perform bed/chair transfers w/assist (PT) Description: LTG: Patient will perform bed to chair transfers with assistance (PT). Flowsheets (Taken 12/29/2018 0755) LTG: Pt will perform Bed to Chair Transfers with assistance level: (upgraded 7/6 due to pt progress) Supervision/Verbal cueing Note: Upgraded 7/6 due to pt progress    Problem: RH Car Transfers Goal: LTG Patient will perform car transfers with assist (PT) Description: LTG: Patient will perform car transfers with assistance (PT). Flowsheets (Taken 12/29/2018 0755) LTG: Pt will perform car transfers with assist:: (upgraded 7/6 due to pt progress) Contact Guard/Touching assist Note: Upgraded 7/6 due to pt progress    Problem: RH Furniture Transfers Goal: LTG Patient will perform furniture transfers w/assist (OT/PT) Description: LTG: Patient will perform furniture transfers  with assistance (OT/PT). Flowsheets (Taken 12/29/2018 0755) LTG: Pt will perform furniture transfers with assist:: (upgraded 7/6 due to pt progress) Supervision/Verbal cueing Note: Upgraded 7/6 due to pt progress    Problem: RH Ambulation Goal: LTG Patient will ambulate in controlled environment (PT) Description: LTG: Patient will ambulate in a controlled environment, # of feet with assistance  (PT). Flowsheets Taken 12/29/2018 0755 by Bobbie Stack, PT LTG: Pt will ambulate in controlled environ  assist needed:: (upgraded 7/6 due to pt progress) Contact Guard/Touching assist Taken 12/20/2018 1832 by Tawana Scale, PT LTG: Ambulation distance in controlled environment: 48ft using LRAD Note: Upgraded 7/6 due to pt progress  Goal: LTG Patient will ambulate in home environment (PT) Description: LTG: Patient will ambulate in home environment, # of feet with assistance (PT). Flowsheets Taken 12/29/2018 0755 by Bobbie Stack, PT LTG: Pt will ambulate in home environ  assist needed:: (upgraded 7/6 due to pt progress) Contact Guard/Touching assist Taken 12/20/2018 1832 by Tawana Scale, PT LTG: Ambulation distance in home environment: 57ft using LRAD Note: Upgraded 7/6 due to pt progress    Problem: RH Stairs Goal: LTG Patient will ambulate up and down stairs w/assist (PT) Description: LTG: Patient will ambulate up and down # of stairs with assistance (PT) Flowsheets Taken 12/29/2018 0755 LTG: Pt will ambulate up/down stairs assist needed:: (upgraded 7/6 due to pt progress) Contact Guard/Touching assist Taken 12/23/2018 1420 LTG: Pt will  ambulate up and down number of stairs: 2 steps w/ bilateral rails, as per home set-up Note: Upgraded 7/6 due to pt progress    Flowsheets (Taken 12/29/2018 0755) LTG: Pt will maintain dynamic standing balance during mobility activities with:: (upgraded 7/6 due to pt progress) Contact Guard/Touching assist Note: Upgraded 7/6 due to pt progress

## 2018-12-29 NOTE — Progress Notes (Signed)
Occupational Therapy Session Note  Patient Details  Name: Walter Grimes MRN: [id number] Date of Birth: 1990/07/31  Today's Date: 12/29/2018 OT Individual Time: 1300-1400 OT Individual Time Calculation (min): 60 min    Short Term Goals: Week 2:  OT Short Term Goal 1 (Week 2): Pt will complete 1 step of toileting task OT Short Term Goal 2 (Week 2): Pt will maintain standing balance within BADL tasks with consistent min A OT Short Term Goal 3 (Week 2): Pt will complete LB dressing with no more than min A OT Short Term Goal 4 (Week 2): Pt will demonstrate self ROM of R UE with min verbal cues  Skilled Therapeutic Interventions/Progress Updates:    patient in bed, ready for therapy.   He is pleasant and cooperative t/o session, states that he is frustrated with right LE immobility.  SPT to/from bed, w/c, mat table and nustep with min A to facilitate control of right knee and maintain balance without UE support. Patient able to propel w/c on unit and to/from therapy gyms.   Completed unsupported sitting activities without LOB, drumming activities with bilateral UEs - he notes that right UE continues with slowed response time, encouraged to continue with [facility] activities.  Hands and knees - mod to max A to transition to this position and min/mod a to maintain with left UE/LE reach/kick back.  Right LE tends to move to extension with challenge - reviewed strategies for safety with extensor pattern.  Nustep x 5 minutes.  Standing weight shift and right knee control activities with bilateral UE support .  He returned to bed at close of session with min A, bed alarm set and call bell in reach.    Therapy Documentation Precautions:  Precautions Precautions: Fall Restrictions Weight Bearing Restrictions: No General:   Vital Signs: Therapy Vitals Temp: 99.1 F (37.3 C) Temp Source: Oral Pulse Rate: 83 Resp: 16 BP: 130/79 Patient Position (if appropriate): Sitting Oxygen Therapy SpO2: 100 % O2  Device: Room Air Pain: Pain Assessment Pain Scale: 0-10 Pain Score: 0-No pain   Therapy/Group: Individual Therapy  Carlos Levering 12/29/2018, 3:11 PM

## 2018-12-29 NOTE — Progress Notes (Signed)
[facility] PHYSICAL MEDICINE & REHABILITATION PROGRESS NOTE   Subjective/Complaints: Patient seen laying in bed this morning.  He states he did not sleep well overnight and states he is "just tired".  ROS: Denies CP, SOB, N/V/D  Objective:   No results found. No results for input(s): WBC, HGB, HCT, PLT in the last 72 hours. No results for input(s): NA, K, CL, CO2, GLUCOSE, BUN, CREATININE, CALCIUM in the last 72 hours.  Intake/Output Summary (Last 24 hours) at 12/29/2018 1037 Last data filed at 12/29/2018 0745 Gross per 24 hour  Intake 460 ml  Output 450 ml  Net 10 ml     Physical Exam: Vital Signs Blood pressure 120/77, pulse 70, temperature 98.4 F (36.9 C), temperature source Oral, resp. rate 18, height 5\' 8"  (1.727 m), weight 74.5 kg, SpO2 97 %.  Constitutional: NAD. Vital signs reviewed.  HENT: Normocephalic.  Atraumatic. Eyes: EOMI.  No discharge. Cardiovascular: No JVD. Respiratory: Normal effort. GI: Non-distended. Musc: No edema or tenderness in extremities. Neurological: Alert Motor: RUE: 4-/5 shoulder abduction, 4--4/5 elbow flexion and handgrip, 4-/5 elbow extension, wrist extension, unchanged [name]: 3/5 hip flexion, knee extension, 2-/5 ankle dorsiflexion, unchanged Skin: Numerous tattoos. Psychiatric: Flat, improving  Assessment/Plan: 1. 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  Physiatrist is providing close team supervision and 24 hour management of active medical problems listed below.  Physiatrist and rehab team continue to assess barriers to discharge/monitor patient progress toward functional and medical goals  Care Tool:  Bathing    Body parts bathed by patient: Right arm, Chest, Abdomen, Right upper leg, Left upper leg, Left lower leg, Face, Left arm, Front perineal area, Right lower leg, Buttocks   Body parts bathed by  helper: Front perineal area, Buttocks, Left arm, Right lower leg     Bathing assist Assist Level: Supervision/Verbal cueing     Upper Body Dressing/Undressing Upper body dressing   What is the patient wearing?: Pull over shirt    Upper body assist Assist Level: Supervision/Verbal cueing    Lower Body Dressing/Undressing Lower body dressing      What is the patient wearing?: Underwear/pull up, Pants     Lower body assist Assist for lower body dressing: Contact Guard/Touching assist     Toileting Toileting    Toileting assist Assist for toileting: Minimal Assistance - Patient > 75% Assistive Device Comment: urinal   Transfers Chair/bed transfer  Transfers assist     Chair/bed transfer assist level: Contact Guard/Touching assist     Locomotion Ambulation   Ambulation assist      Assist level: Minimal Assistance - Patient > 75% Assistive device: Walker-rolling Max distance: 20'   Walk 10 feet activity   Assist  Walk 10 feet activity did not occur: Safety/medical concerns  Assist level: Minimal Assistance - Patient > 75% Assistive device: Walker-rolling   Walk 50 feet activity   Assist Walk 50 feet with 2 turns activity did not occur: Safety/medical concerns         Walk 150 feet activity   Assist Walk 150 feet activity did not occur: Safety/medical concerns         Walk 10 feet on uneven surface  activity   Assist Walk 10 feet on uneven surfaces activity did not occur: Safety/medical concerns         Wheelchair     Assist Will patient use wheelchair at discharge?: Yes Type of Wheelchair:  Manual Wheelchair activity did not occur: Safety/medical concerns(unable due to pt fatigue)  Wheelchair assist level: Supervision/Verbal cueing Max wheelchair distance: 150'    Wheelchair 50 feet with 2 turns activity    Assist        Assist Level: Supervision/Verbal cueing   Wheelchair 150 feet activity     Assist      Assist Level: Supervision/Verbal cueing     Medical Problem List and Plan: 1.Cognitive deficits with decreased functional mobilitysecondary toleft parietal frontal ICH with IVH  Continue CIR  [name] [name] 2. Antithrombotics: -DVT/anticoagulation:Subcutaneous heparin initiated 12/12/2018 -antiplatelet therapy: N/A 3. Pain Management: Tramadol DC'd  Tylenol scheduled-improvement in headaches  Low dose baclofen started on 7/1, changed to PRN on 7/2  Fioricet as needed for headache 4. Mood:Xanax 0.25 mg 3 times daily for anxiety, relatively controlled  Appears depressed, but does not want medications, unchanged  team will need to provide regular reassurance  Appreciate neuropsych eval, notes reviewed antipsychotic agents: N/A 5. Neuropsych: This patient ?Fullycapable of making decisions on hisown behalf. 6. Skin/Wound Care:Routine skin checks 7. Fluids/Electrolytes/Nutrition:encourage PO  BMP within normal limits on 6/29, labs pending 8. Seizure disorder. Valproate 750 mg every 8 hours.   Latest valproic acid level 33 (low) on 7/1, patient has missed several doses of medication, compliance appears to be improving  Repeat lab pending 9. History of alcohol tobacco and marijuana use. Provide counseling as appropriate 10.  Sleep disturbance, predominantly due to psychosocial stressors  Trazodone nightly  Improving overall 11.  Transaminitis  LFTs elevated on 7/3  Continue to monitor  LOS: 10 days A FACE TO FACE EVALUATION WAS PERFORMED  Torianne Laflam Lorie Phenix 12/29/2018, 10:37 AM

## 2018-12-29 NOTE — Progress Notes (Signed)
Physical Therapy Weekly Progress Note  Patient Details  Name: Walter Grimes MRN: [id number] Date of Birth: 07-04-1990  Beginning of progress report period: December 20, 2018 End of progress report period: December 29, 2018  Today's Date: 12/29/2018 PT Individual Time: 0900-1010 PT Individual Time Calculation (min): 70 min   Patient has met 3 of 3 short term goals. Pt is making excellent progress towards LTGs. He is performing bed mobility and transfers w/ min assist and ambulating up to 40' w/ mod assist using RW and RAFO.  Patient continues to demonstrate the following deficits muscle weakness, decreased cardiorespiratoy endurance, unbalanced muscle activation, decreased coordination and decreased motor planning, decreased visual perceptual skills, decreased problem solving and decreased safety awareness and decreased standing balance, decreased postural control, hemiplegia and decreased balance strategies and therefore will continue to benefit from skilled PT intervention to increase functional independence with mobility.  Patient progressing toward long term goals..  Continue plan of care. LTGs upgraded to supervision-CGA overall 2/2 pt progress.   PT Short Term Goals Week 1:  PT Short Term Goal 1 (Week 1): Pt will maintain sitting balance on EOM/EOB without UE support with supervision PT Short Term Goal 1 - Progress (Week 1): Met PT Short Term Goal 2 (Week 1): Pt will perform bed<>chair transfers with mod assist PT Short Term Goal 2 - Progress (Week 1): Met PT Short Term Goal 3 (Week 1): Pt will ambulate 10' using LRAD with max assist of 1 person PT Short Term Goal 3 - Progress (Week 1): Met Week 2:  PT Short Term Goal 1 (Week 2): Pt will ambulate 25' w/ min assist using LRAD PT Short Term Goal 2 (Week 2): Pt will initiate stair training PT Short Term Goal 3 (Week 2): Pt will maintain dynamic standing balance w/ min assist during functional mobility  Skilled Therapeutic Interventions/Progress  Updates:   Pt in supine and agreeable to therapy, no c/o pain. Supervision bed mobility and CGA stand pivot to w/c. Pt self-propelled w/c to/from therapy gym w/ supervision via L hemi technique. Worked on gait training and [name] NMR this session, emphasis on isolating R glut and hamstring activation. Ambulated 20' w/o RAFO, min assist overall w/ min manual assist for R foot placement 2/2 toe drag. Ambulated 10' w/ RAFO w/ improved R foot clearance, however significantly increased R extension thrust in single leg stance. Pt unable to correct. Worked on R hamstring/glut activation w/ supine bridges and heel slides. Tactile and verbal cues throughout for glut/hamstring activation. Pt w/ palpable activation, however minimally clearing buttocks from table during bridge and needing active-assist w/ heel slides. Standing partial knee bends w/ emphasis on maintaining primarily R weight shift, min-mod assist for balance and tactile and manual cues for R knee control. Kinetron [name] only w/ resistance provided by therapist on other pedal, 3x30 reps w/ tactile cues for hamstring activation. NuStep 5 min @ level 3 w/ all extremities, constant tactile cues for neutral [name] alignment, pt w/ tendency to compensate w/ glut med. Performed 4 min w/ BLEs only, tactile cues again for neutral [name] alignment and hamstring activation. Returned to room via w/c, performed stand pivot to toilet w/ CGA. Pt adamant about being left alone on toilet, states "they usually shut the door and I ring when I am ready". Educated on unit policy of supervision w/ all toileting tasks and pt agreeable for therapist to leave door cracked during toileting. CGA stand pivot back to w/c after continent BM and void. CGA-close supervision while  pt performed LE garment management. Ended session in w/c, all needs in reach.   Therapy Documentation Precautions:  Precautions Precautions: Fall Restrictions Weight Bearing Restrictions: No  Therapy/Group: Individual  Therapy  Kenora Spayd K Dalin Caldera 12/29/2018, 10:29 AM

## 2018-12-29 NOTE — Progress Notes (Signed)
Orthopedic Tech Progress Note Patient Details:  Walter Grimes 1990/09/18 [id number] Called [employer] for afo. Patient ID: Walter Grimes, male   DOB: 05-11-1991, 28 y.o.   MRN: [id number]   Walter Grimes T 12/29/2018, 3:46 PM

## 2018-12-29 NOTE — Progress Notes (Signed)
Speech Language Pathology Daily Session Note  Patient Details  Name: Walter Grimes MRN: [id number] Date of Birth: [birth year]  Today's Date: 12/29/2018 SLP Individual Time: 1030-1110 SLP Individual Time Calculation (min): 40 min  Short Term Goals: Week 2: SLP Short Term Goal 1 (Week 2): Pt will utilize word finding strategies to convey semi-complex information with supervision cues. SLP Short Term Goal 2 (Week 2): Pt will complete semi-complex problem solving tasks with Min A cues. SLP Short Term Goal 3 (Week 2): Pt will demonstrate selective attention in moderately distracting environment for ~ 45 minutes with supervision cues. SLP Short Term Goal 4 (Week 2): Pt will demonstrate intellectual awareness for cognitive deficit in 7 out of 10 opportunities with Mod A cues.  Skilled Therapeutic Interventions: Skilled treatment session focused on cognitive goals. SLP facilitated session by providing Max A verbal cues for recall of his current medications and their functions. Patient organized a QD pill box with overall supervision level verbal cues for problem solving and encouragement from SLP. Patient requested to use bathroom at end of session and required Mod verbal cues for safety with task. Patient left on commode with RN present. Continue with current plan of care.      Pain Pain Assessment Pain Scale: 0-10 Pain Score: 0-No pain Faces Pain Scale: No hurt  Therapy/Group: Individual Therapy  [place] Welcome 12/29/2018, 12:37 PM

## 2018-12-30 ENCOUNTER — Inpatient Hospital Stay (HOSPITAL_COMMUNITY): Payer: Self-pay | Admitting: Physical Therapy

## 2018-12-30 ENCOUNTER — Inpatient Hospital Stay (HOSPITAL_COMMUNITY): Payer: Self-pay | Admitting: Occupational Therapy

## 2018-12-30 ENCOUNTER — Inpatient Hospital Stay (HOSPITAL_COMMUNITY): Payer: Self-pay | Admitting: Speech Pathology

## 2018-12-30 NOTE — Progress Notes (Signed)
[facility] PHYSICAL MEDICINE & REHABILITATION PROGRESS NOTE   Subjective/Complaints: Patient seen working with therapies this AM.  He states he did not sleep well overnight, but does not want any medications.    ROS: Denies CP, SOB, N/V/D  Objective:   No results found. No results for input(s): WBC, HGB, HCT, PLT in the last 72 hours. Recent Labs    12/29/18 1017  NA 139  K 3.9  CL 104  CO2 26  GLUCOSE 90  BUN 11  CREATININE 0.86  CALCIUM 9.4    Intake/Output Summary (Last 24 hours) at 12/30/2018 0929 Last data filed at 12/30/2018 0816 Gross per 24 hour  Intake 480 ml  Output -  Net 480 ml     Physical Exam: Vital Signs Blood pressure 117/84, pulse 72, temperature 98.7 F (37.1 C), temperature source Oral, resp. rate 18, height 5\' 8"  (1.727 m), weight 74.5 kg, SpO2 95 %.  Constitutional: NAD. Vital signs reviewed.  HENT: Normocephalic.  Atraumatic. Eyes: EOMI.  No discharge. Cardiovascular: No JVD. Respiratory: Normal effort. GI: Non-distended. Musc: No edema or tenderness in extremities. Neurological: Alert Motor: RUE: 4-/5 shoulder abduction, 4--4/5 elbow flexion and handgrip, 4-/5 elbow extension, wrist extension, unchanged [name]: 3+/5 hip flexion, knee extension, 2-/5 ankle dorsiflexion Skin: Numerous tattoos. Psychiatric: Flat, improving  Assessment/Plan: 1. 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  Physiatrist is providing close team supervision and 24 hour management of active medical problems listed below.  Physiatrist and rehab team continue to assess barriers to discharge/monitor patient progress toward functional and medical goals  Care Tool:  Bathing    Body parts bathed by patient: Right arm, Chest, Abdomen, Right upper leg, Left upper leg, Left lower leg, Face, Left arm, Front perineal area, Right lower leg, Buttocks    Body parts bathed by helper: Front perineal area, Buttocks, Left arm, Right lower leg     Bathing assist Assist Level: Supervision/Verbal cueing     Upper Body Dressing/Undressing Upper body dressing   What is the patient wearing?: Pull over shirt    Upper body assist Assist Level: Supervision/Verbal cueing    Lower Body Dressing/Undressing Lower body dressing      What is the patient wearing?: Underwear/pull up, Pants     Lower body assist Assist for lower body dressing: Contact Guard/Touching assist     Toileting Toileting    Toileting assist Assist for toileting: Minimal Assistance - Patient > 75% Assistive Device Comment: urinal   Transfers Chair/bed transfer  Transfers assist     Chair/bed transfer assist level: Contact Guard/Touching assist     Locomotion Ambulation   Ambulation assist      Assist level: Minimal Assistance - Patient > 75% Assistive device: Walker-rolling Max distance: 20'   Walk 10 feet activity   Assist  Walk 10 feet activity did not occur: Safety/medical concerns  Assist level: Minimal Assistance - Patient > 75% Assistive device: Walker-rolling   Walk 50 feet activity   Assist Walk 50 feet with 2 turns activity did not occur: Safety/medical concerns         Walk 150 feet activity   Assist Walk 150 feet activity did not occur: Safety/medical concerns         Walk 10 feet on uneven surface  activity   Assist Walk 10 feet on uneven surfaces activity did not occur: Safety/medical concerns  Wheelchair     Assist Will patient use wheelchair at discharge?: Yes Type of Wheelchair: Manual Wheelchair activity did not occur: Safety/medical concerns(unable due to pt fatigue)  Wheelchair assist level: Supervision/Verbal cueing Max wheelchair distance: 150'    Wheelchair 50 feet with 2 turns activity    Assist        Assist Level: Supervision/Verbal cueing   Wheelchair 150 feet activity      Assist     Assist Level: Supervision/Verbal cueing     Medical Problem List and Plan: 1.Cognitive deficits with decreased functional mobilitysecondary toleft parietal frontal ICH with IVH  Continue CIR  [name] [name] 2. Antithrombotics: -DVT/anticoagulation:Subcutaneous heparin initiated 12/12/2018 -antiplatelet therapy: N/A 3. Pain Management: Tramadol DC'd  Tylenol scheduled-improvement in headaches  Low dose baclofen started on 7/1, changed to PRN on 7/2  Fioricet as needed for headache 4. Mood:Xanax 0.25 mg 3 times daily for anxiety, relatively controlled  Appears depressed, but does not want medications, unchanged  team will need to provide regular reassurance  Appreciate neuropsych eval, notes reviewed antipsychotic agents: N/A 5. Neuropsych: This patient ?Fullycapable of making decisions on hisown behalf. 6. Skin/Wound Care:Routine skin checks 7. Fluids/Electrolytes/Nutrition:encourage PO  BMP within normal limits on 7/6 8. Seizure disorder. Valproate 750 mg every 8 hours.   Level within normal limits on 7/6 9. History of alcohol tobacco and marijuana use. Provide counseling as appropriate 10.  Sleep disturbance, predominantly due to psychosocial stressors  Trazodone nightly  Improving overall 11.  Transaminitis  LFTs elevated on 7/3  Continue to monitor 12.  Labile blood pressure  Labile on 7/7  LOS: 11 days A FACE TO FACE EVALUATION WAS PERFORMED   Lorie Phenix 12/30/2018, 9:29 AM

## 2018-12-30 NOTE — Progress Notes (Signed)
Speech Language Pathology Discharge Summary  Patient Details  Name: Walter Grimes MRN: [id number] Date of Birth: [birth year]  Today's Date: 12/30/2018 SLP Individual Time: 3979-5369 SLP Individual Time Calculation (min): 40 min   Skilled Therapeutic Interventions:   Skilled treatment session focused on cognitive goals. Patient continues to verbalize that he is at his cognitive baseline and has a h/o of difficulty with recall and processing of information which ulitmately led to him not completing high school.  A standardized assessment was administered (cognistat) to assess current cognitive functioning. Patient scored WFL on all subtests with the exception of calculations and short-term memory. Patient reported difficulty with math at baseline and utilizing a calculator to complete daily math and also reported difficulty with recall of numbers and unrelated words but increased recall of functional information. SLP spoke to treatment team which confirmed increased recall and carryover of functional information during sessions. Suspect patient is at his cognitive baseline. Therefore, patient will be discharged from skilled SLP intervention and will have increased therapy time with PT/OTper his request. Patient verbalized understanding and agreement.   Patient has met 4 of 4 long term goals.  Patient to discharge at overall Supervision;Modified Independent level.   Reasons goals not met: N/A   Clinical Impression/Discharge Summary: Patient has made functional gains and has met 4 of 4 LTGs this admission. Currently, patient requires overall Mod I-Supervision level verbal cues to complete functional and mildly complex tasks safely in regards to selective attention, problem solving, recall and awareness. Patient education is complete and patient reports he is at his baseline level of cognitive functioning, therefore, f/u is not warranted at this time. Patient verbalized understanding and agreement.    Care Partner:  Caregiver Able to Provide Assistance: Yes     Recommendation:  24 hour supervision/assistance      Equipment: N/A   Reasons for discharge: Treatment goals met   Patient/Family Agrees with Progress Made and Goals Achieved: Yes    [place], [place] 12/30/2018, 3:22 PM

## 2018-12-30 NOTE — Progress Notes (Signed)
Occupational Therapy Session Note  Patient Details  Name: Walter Grimes MRN: [id number] Date of Birth: 12/27/1990  Today's Date: 12/30/2018 OT Individual Time: 0733-0845 OT Individual Time Calculation (min): 72 min    Short Term Goals: Week 1:  OT Short Term Goal 1 (Week 1): Pt will complete UB dressing with supervision following hemi dressing techniques for 3 consecutive sessions. OT Short Term Goal 1 - Progress (Week 1): Met OT Short Term Goal 2 (Week 1): Pt will complete LB bathing sit to stand with min assist. OT Short Term Goal 2 - Progress (Week 1): Met OT Short Term Goal 3 (Week 1): Pt will perform LB dressing with mod assist sit to stand. OT Short Term Goal 3 - Progress (Week 1): Met OT Short Term Goal 4 (Week 1): Pt will use the RUE as stabilizer for holding grooming and bathing items with min instructional cueing and supervision. OT Short Term Goal 4 - Progress (Week 1): Met OT Short Term Goal 5 (Week 1): Pt will complete toilet transfer stand pivot with mod assist. OT Short Term Goal 5 - Progress (Week 1): Met  Skilled Therapeutic Interventions/Progress Updates:    Treatment session with focus on trunk control and RUE/[name] NMR.  Pt received upright in w/c declining bathing/dressing expressing desire to work on leg strength and walking.  Pt propelled w/c to therapy gym with hemi-technique.  Engaged in dynamic standing activity with CGA to facilitate weight shifting through [name] while completing gross and fine motor control activity with resistive clothespins.  Engaged in stepping activity with pt able to step [name] forward but unable to step back or to side.  Transitioned to exercise in sidelying with use of powder board to target hip flexion and extension.  Therapist providing facilitation for full ROM with tapping and gentle assist to promote flexion.  Engaged in bridging and trunk rotation with knees in flexion to further address hip and knee control as needed for mobility and  self-care tasks.  Utilized Kinetron in sitting with focus on BLE strengthening and control.  Returned to room as above.  Pt required min assist for all stand pivot transfers due to Rt knee hyperextension and decreased motor planning and impulsivity.  Pt left reclined in bed with all needs in reach.  Therapy Documentation Precautions:  Precautions Precautions: Fall Restrictions Weight Bearing Restrictions: No Pain: Pain Assessment Pain Scale: 0-10 Pain Score: 0-No pain Faces Pain Scale: No hurt   Therapy/Group: Individual Therapy  HOXIE, SARAH 12/30/2018, 12:08 PM  

## 2018-12-30 NOTE — Progress Notes (Addendum)
Physical Therapy Session Note  Patient Details  Name: Walter Grimes MRN: [id number] Date of Birth: [birth year]  Today's Date: 12/30/2018 PT Individual Time: 0915-0940 AND 1100-1155 PT Individual Time Calculation (min): 25 min AND 55 min  Short Term Goals: Week 2:  PT Short Term Goal 1 (Week 2): Pt will ambulate 25' w/ min assist using LRAD PT Short Term Goal 2 (Week 2): Pt will initiate stair training PT Short Term Goal 3 (Week 2): Pt will maintain dynamic standing balance w/ min assist during functional mobility  Skilled Therapeutic Interventions/Progress Updates:   Session 1: Pt in supine and agreeable to therapy, no c/o pain. Supine>sit w/ supervision and donned shoes and RAFO w/ total assist from therapist. Worked on gait training w/ RAFO. Ambulated 20' w/ min assist, no assist needed for [name] management and placement however continues to have significant R extension thrust in single limb stance, unable to self-correct w/ verbal cues. Added heel wedge to ambulate 20' again w/ RW, however no change. Attempted to perform heel slides, trace hamstring activation and unable to isolate hamstring from extensor synergy. Returned to supine and ended session in supine, all needs in reach.   Session 2: Pt in supine and agreeable to therapy, no c/o pain. Supine to sit w/ supervision and CGA stand pivot to w/c. Pt self-propelled w/c to/from therapy gym w/ supervision. Worked on [name] and NMR this session in [place]. Ambulated 7 min in total, over 3 bouts, @ 0.7 mph on treadmill. Emphasis on achieving equal step length, allowing terminal stance to occur in [name], and minimizing [name] extension thrust in stance. Tactile, manual, and verbal cues for step length/placement and for toe-off to facilitate increased R knee flexion during swing. Tactile cues at R hamstring musculature during swing for knee flexion. Pt w/ difficulty getting any hamstring activation in stance 2/2 extensor synergy dominating swing. However  able to achieve adequate terminal stance, no extension thrust, and equal step placement ~50% of the time. Returned to room via w/c, assisted w/ toilet transfer - CGA. Pt continues to be adamant about being left to perform toilet transfers by himself. Agreeable to sit and call for assistance prior to standing again. Adamantly refused therapist to stay for supervision, made NT/RN aware and reinforced unit fall policy.   Therapy Documentation Precautions:  Precautions Precautions: Fall Restrictions Weight Bearing Restrictions: No Vital Signs: Therapy Vitals Temp: 98.7 F (37.1 C) Temp Source: Oral Pulse Rate: 72 Resp: 18 BP: 117/84 Patient Position (if appropriate): Lying Oxygen Therapy SpO2: 95 % O2 Device: Room Air Pain: Pain Assessment Pain Scale: 0-10 Pain Score: 3  Pain Type: Acute pain Pain Location: Head Pain Descriptors / Indicators: Headache Pain Frequency: Intermittent Pain Onset: On-going Patients Stated Pain Goal: 2 Pain Intervention(s): Medication (See eMAR)  Therapy/Group: Individual Therapy  Walter Grimes [name] 12/30/2018, 9:46 AM

## 2018-12-31 ENCOUNTER — Inpatient Hospital Stay (HOSPITAL_COMMUNITY): Payer: Self-pay

## 2018-12-31 ENCOUNTER — Inpatient Hospital Stay (HOSPITAL_COMMUNITY): Payer: Self-pay | Admitting: Physical Therapy

## 2018-12-31 NOTE — Patient Care Conference (Signed)
Inpatient RehabilitationTeam Conference and Plan of Care Update Date: 12/31/2018   Time: 2:30 PM    Patient Name: Walter Grimes      Medical Record Number: [id number]  Date of Birth: 12-28-1990 Sex: Male         Room/Bed: 4M11C/4M11C-01 Payor Info: Payor: MEDICAID POTENTIAL / Plan: MEDICAID POTENTIAL / Product Type: *No Product type* /    Admitting Diagnosis: 6. ABI Team LT. CVA, ICH, 17-19 days  Admit Date/Time:  12/19/2018  4:25 PM Admission Comments: No comment available   Primary Diagnosis:  <principal problem not specified> Principal Problem: <principal problem not specified>  Patient Active Problem List   Diagnosis Date Noted  . Cognitive deficit, post-stroke   . Reactive depression   . Labile blood pressure   . Seizures ([place])   . Transaminitis   . Sleep disturbance   . Anxiety state   . ICH (intracerebral hemorrhage) ([place]) 12/09/2018    Expected Discharge Date: Expected Discharge Date: 01/07/19  Team Members Present: Physician leading conference: Dr. Delice Lesch Social Worker Present: Lennart Pall, LCSW Nurse Present: Judee Clara, LPN PT Present: Burnard Bunting, PT OT Present: Mariane Masters, OT SLP Present: Weston Anna, SLP PPS Coordinator present : Gunnar Fusi, SLP     Current Status/Progress Goal Weekly Team Focus  Medical   Cognitive deficits with decreased functional mobility secondary to left parietal frontal ICH with IVH  Improve mobility, sleep, motivation  See above   Bowel/Bladder   Continent of bladder/bowel, LBM 12/30/18  Maintain continence  Assess QS/PRN   Swallow/Nutrition/ Hydration             ADL's   min A overall for transfers and ADL, occasionally mod for LOB  Supervision/min A  R NMR, transfers, ADl retraining, balance   Mobility   supervision bed mobility, CGA transfers and gait w/ RW up to 40', min assist 4 steps B rails  supervision-CGA overall  gait training, [name] NMR, stair negotiation   Communication              Safety/Cognition/ Behavioral Observations  Supervision  Supervision  Goals Met, Patient discharged from SLP intervention   Pain   headache xontinent at interval less frequent, continue schedule Tylenol Q6 hours  pain level< 3  Assess QS/PRN, evaluate pain meds   Skin   No skin issues or concerns at this time  Maintain skin integrity  Assess QS/PRN    Rehab Goals Patient on target to meet rehab goals: Yes *See Care Plan and progress notes for long and short-term goals.     Barriers to Discharge  Current Status/Progress Possible Resolutions Date Resolved   Physician    Medical stability;Medication compliance     See above  Therapies, follow labs/vitals, encourage med compliance      Nursing                  PT                    OT                  SLP                SW                Discharge Planning/Teaching Needs:  Pt to d/c to home of parents but is also considering home with girlfriend if possible.  Teaching to be scheduled prior to d/c.   Team Discussion:  No pain;  Reluctant to allow staff to assist with transfers/ toileting.  Close supervision with tfs, standing;  CG short distance amb.  Min assist ADLs with supervision to min assist goals.  ST has d/c'd.  Goals all upgraded to supervision.  SW to follow up with pt about d/c location plans.  Revisions to Treatment Plan:  Goals upgraded    Continued Need for Acute Rehabilitation Level of Care: The patient requires daily medical management by a physician with specialized training in physical medicine and rehabilitation for the following conditions: Daily direction of a multidisciplinary physical rehabilitation program to ensure safe treatment while eliciting the highest outcome that is of practical value to the patient.: Yes Daily medical management of patient stability for increased activity during participation in an intensive rehabilitation regime.: Yes Daily analysis of laboratory values and/or radiology reports  with any subsequent need for medication adjustment of medical intervention for : Neurological problems;Other;Blood pressure problems   I attest that I was present, lead the team conference, and concur with the assessment and plan of the team.   Donata Clay, Bert Givans 12/31/2018, 3:41 PM   Team conference was held via web/ teleconference due to [place] - 85

## 2018-12-31 NOTE — Progress Notes (Signed)
Physical Therapy Session Note  Patient Details  Name: Walter Grimes MRN: [id number] Date of Birth: [birth year]  Today's Date: 12/31/2018 PT Individual Time: 0800-0856 PT Individual Time Calculation (min): 56 min   Short Term Goals: Week 2:  PT Short Term Goal 1 (Week 2): Pt will ambulate 25' w/ min assist using LRAD PT Short Term Goal 2 (Week 2): Pt will initiate stair training PT Short Term Goal 3 (Week 2): Pt will maintain dynamic standing balance w/ min assist during functional mobility  Skilled Therapeutic Interventions/Progress Updates:   Pt in supine and agreeable to therapy, no c/o pain. Supine>sit w/ supervision and donned shoes and RAFO total assist. Supervision stand pivot to w/c. Pt self-propelled w/c to/from therapy gym via L hemi technique. Worked on Personnel officer w/ LRAD. Ambulated 20' w/ RW and close supervision-CGA, 20' w/ quad cane and min assist, and 10' w/o AD and mod assist. Pt asking about using crutches, discussed safety concerns w/ crutches, especially w/ his dominant RUE remaining weak and w/ impaired coordination at this time. Pt verbalized understanding. Worked on [name] NMR w/ emphasis on weight shifting and proprioception. Stood to pin clothespins onto basketball hoop w/ RUE and emphasis on reaching to R side and maintaining adequate [name] eccentric control during weight shifting, min assist fading to CGA w/ practice. Tactile and verbal cues for R weight shifting and for bending B knees. Performed on level surface and then w/ LLE on 2" step to further promote R weight shifting. Practiced stair negotiation, per set-up of mom's house (3 steps w/ B rails), performed w/ CGA-min assist and verbal cues for technique. Discussed home access for both mom's and girlfriend's homes. Pt reports girlfriend also has a few steps w/ at least 1 rail that he can recall.. States someone will be with him 24/7 and his girlfriend will likely drop him off at a family member's house on her way to work.  Pt verbalizes understanding importance of 24/7 supervision upon d/c. Returned to room and ended session in w/c, all needs in reach.   Therapy Documentation Precautions:  Precautions Precautions: Fall Restrictions Weight Bearing Restrictions: No Vital Signs: Therapy Vitals Temp: 98.1 F (36.7 C) Temp Source: Oral Pulse Rate: 68 Resp: 14 BP: 107/80 Patient Position (if appropriate): Lying Oxygen Therapy SpO2: 100 % O2 Device: Room Air  Therapy/Group: Individual Therapy  [name] K [name] 12/31/2018, 8:59 AM

## 2018-12-31 NOTE — Progress Notes (Signed)
Occupational Therapy Session Note  Patient Details  Name: Walter Grimes MRN: [id number] Date of Birth: [birth year]  Today's Date: 12/31/2018 OT Individual Time: 7615-1834 OT Individual Time Calculation (min): 72 min    Short Term Goals: Week 1:  OT Short Term Goal 1 (Week 1): Pt will complete UB dressing with supervision following hemi dressing techniques for 3 consecutive sessions. OT Short Term Goal 1 - Progress (Week 1): Met OT Short Term Goal 2 (Week 1): Pt will complete LB bathing sit to stand with min assist. OT Short Term Goal 2 - Progress (Week 1): Met OT Short Term Goal 3 (Week 1): Pt will perform LB dressing with mod assist sit to stand. OT Short Term Goal 3 - Progress (Week 1): Met OT Short Term Goal 4 (Week 1): Pt will use the RUE as stabilizer for holding grooming and bathing items with min instructional cueing and supervision. OT Short Term Goal 4 - Progress (Week 1): Met OT Short Term Goal 5 (Week 1): Pt will complete toilet transfer stand pivot with mod assist. OT Short Term Goal 5 - Progress (Week 1): Met  Skilled Therapeutic Interventions/Progress Updates:    1:1. Pt received in w/c requesting to work on [employer] and agreeable to shower during second session of the day. Pt propels w/c with hemi technqiue and supervision. Pt completes BUE assessments as follows:  9HPT  RUE 38.8 LUE 25.1  Box and blocks RUE 34 LUE 49  Pt benefitted from UE assessment for improved awareness of RUE deficits  Pt ambulates short distances throughout session with MIN A and R knee guard to keep LE from hyperextending. Pt completes 10 min NuStep on level 6 for 6 min and level 8 for 4 min with VC for keeping knee from externally rotating and VC for use of RUE for manipulating handles/screws when setting up length for RUE coordination.   Pt ambulates throughout apartment with min A overall MOD A for 2 LOB laterally with R knee guarding for hyperextension. Pt completes couch transfer, TTB  transfer and dynamic reaching into shelves at ambulatory level with VC for Rw management.   Exited session with pt seated on toilet and NT in room finishing supervising toileting.   Therapy Documentation Precautions:  Precautions Precautions: Fall Restrictions Weight Bearing Restrictions: No General:   Vital Signs:   Pain:   ADL: ADL Eating: Set up Where Assessed-Eating: Wheelchair Grooming: Minimal assistance Where Assessed-Grooming: Edge of bed Upper Body Bathing: Minimal assistance Where Assessed-Upper Body Bathing: Edge of bed Lower Body Bathing: Maximal assistance Where Assessed-Lower Body Bathing: Edge of bed Upper Body Dressing: Moderate assistance Where Assessed-Upper Body Dressing: Edge of bed Lower Body Dressing: Maximal assistance Where Assessed-Lower Body Dressing: Edge of bed Toileting: Maximal assistance Where Assessed-Toileting: Bedside Commode Toilet Transfer: Maximal assistance Toilet Transfer Method: Stand pivot Science writer: Art gallery manager    Praxis   Exercises:   Other Treatments:     Therapy/Group: Individual Therapy  Tonny Branch 12/31/2018, 10:27 AM

## 2018-12-31 NOTE — Progress Notes (Signed)
Occupational Therapy Session Note  Patient Details  Name: Walter Grimes MRN: [id number] Date of Birth: 08-27-1990  Today's Date: 12/31/2018 OT Individual Time: 1503-1600 OT Individual Time Calculation (min): 57 min    Short Term Goals: Week 1:  OT Short Term Goal 1 (Week 1): Pt will complete UB dressing with supervision following hemi dressing techniques for 3 consecutive sessions. OT Short Term Goal 1 - Progress (Week 1): Met OT Short Term Goal 2 (Week 1): Pt will complete LB bathing sit to stand with min assist. OT Short Term Goal 2 - Progress (Week 1): Met OT Short Term Goal 3 (Week 1): Pt will perform LB dressing with mod assist sit to stand. OT Short Term Goal 3 - Progress (Week 1): Met OT Short Term Goal 4 (Week 1): Pt will use the RUE as stabilizer for holding grooming and bathing items with min instructional cueing and supervision. OT Short Term Goal 4 - Progress (Week 1): Met OT Short Term Goal 5 (Week 1): Pt will complete toilet transfer stand pivot with mod assist. OT Short Term Goal 5 - Progress (Week 1): Met  Skilled Therapeutic Interventions/Progress Updates:    1:1. Pt received in bed with no report of pain. Pt agreeable to working on LE then bathing and dressing. Pt completes stand pivot transfer from EOB<>w/c<>kinetron<>TTB with min A and no AD using bed rail and handles. Pt completes 5x1 min on/1 min off of kinetron for BLE strengthening and endurance for reciprocal movement training with VC for no gripping handles and anterior trunk flexion to shift focus on abdominals and glutes. Pt completes bathing with S sit to stand with grab bar use for steadying self. Pt dresses with MIN A for standing balance to advance pants past hips and MIN A for LOB while attempting to thread shirt overhead in standing despite education on balance deficits/safety awareness. Exited session with pt seated in w/c at sink grooming, exit alarm on and call light in reach  Therapy  Documentation Precautions:  Precautions Precautions: Fall Restrictions Weight Bearing Restrictions: No General:   Vital Signs: Therapy Vitals Temp: 98.5 F (36.9 C) Pulse Rate: 76 Resp: 16 BP: 123/75 Patient Position (if appropriate): Lying Oxygen Therapy SpO2: 100 % O2 Device: Room Air Pain:   ADL: ADL Eating: Set up Where Assessed-Eating: Wheelchair Grooming: Minimal assistance Where Assessed-Grooming: Edge of bed Upper Body Bathing: Minimal assistance Where Assessed-Upper Body Bathing: Edge of bed Lower Body Bathing: Maximal assistance Where Assessed-Lower Body Bathing: Edge of bed Upper Body Dressing: Moderate assistance Where Assessed-Upper Body Dressing: Edge of bed Lower Body Dressing: Maximal assistance Where Assessed-Lower Body Dressing: Edge of bed Toileting: Maximal assistance Where Assessed-Toileting: Bedside Commode Toilet Transfer: Maximal assistance Toilet Transfer Method: Stand pivot Science writer: Art gallery manager    Praxis   Exercises:   Other Treatments:     Therapy/Group: Individual Therapy  Tonny Branch 12/31/2018, 3:22 PM

## 2018-12-31 NOTE — Progress Notes (Addendum)
[facility] PHYSICAL MEDICINE & REHABILITATION PROGRESS NOTE   Subjective/Complaints: Patient seen laying in bed AM.  He states he slept fairly overnight is is awaiting initiation of therapies.  ROS: Denies CP, SOB, N/V/D  Objective:   No results found. No results for input(s): WBC, HGB, HCT, PLT in the last 72 hours. Recent Labs    12/29/18 1017  NA 139  K 3.9  CL 104  CO2 26  GLUCOSE 90  BUN 11  CREATININE 0.86  CALCIUM 9.4    Intake/Output Summary (Last 24 hours) at 12/31/2018 1000 Last data filed at 12/31/2018 0710 Gross per 24 hour  Intake 958 ml  Output 450 ml  Net 508 ml     Physical Exam: Vital Signs Blood pressure 107/80, pulse 68, temperature 98.1 F (36.7 C), temperature source Oral, resp. rate 14, height 5\' 8"  (1.727 m), weight 74.5 kg, SpO2 100 %.  Constitutional: NAD. Vital signs reviewed.  HENT: Normocephalic. Atraumatic. Eyes: EOMI. No discharge. Cardiovascular: No JVD. Respiratory: Normal effort. GI: Non-distended. Musc: No edema or tenderness in extremities. Neurological: Alert Motor: RUE: 4-/5 shoulder abduction, 4--4/5 elbow flexion and handgrip, 4-/5 elbow extension, wrist extension, improving [name]: 3+/5 hip flexion, knee extension, 2-/5 ankle dorsiflexion with apraxia, improving Skin: Numerous tattoos. Psychiatric: Flat, improving  Assessment/Plan: 1. 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  Physiatrist is providing close team supervision and 24 hour management of active medical problems listed below.  Physiatrist and rehab team continue to assess barriers to discharge/monitor patient progress toward functional and medical goals  Care Tool:  Bathing    Body parts bathed by patient: Right arm, Chest, Abdomen, Right upper leg, Left upper leg, Left lower leg, Face, Left arm, Front perineal area, Right lower leg,  Buttocks   Body parts bathed by helper: Front perineal area, Buttocks, Left arm, Right lower leg     Bathing assist Assist Level: Supervision/Verbal cueing     Upper Body Dressing/Undressing Upper body dressing   What is the patient wearing?: Pull over shirt    Upper body assist Assist Level: Supervision/Verbal cueing    Lower Body Dressing/Undressing Lower body dressing      What is the patient wearing?: Underwear/pull up, Pants     Lower body assist Assist for lower body dressing: Contact Guard/Touching assist     Toileting Toileting    Toileting assist Assist for toileting: Minimal Assistance - Patient > 75% Assistive Device Comment: urinal   Transfers Chair/bed transfer  Transfers assist     Chair/bed transfer assist level: Supervision/Verbal cueing     Locomotion Ambulation   Ambulation assist      Assist level: Contact Guard/Touching assist Assistive device: Walker-rolling Max distance: 20'   Walk 10 feet activity   Assist  Walk 10 feet activity did not occur: Safety/medical concerns  Assist level: Contact Guard/Touching assist Assistive device: Walker-rolling   Walk 50 feet activity   Assist Walk 50 feet with 2 turns activity did not occur: Safety/medical concerns         Walk 150 feet activity   Assist Walk 150 feet activity did not occur: Safety/medical concerns         Walk 10 feet on uneven surface  activity   Assist Walk 10 feet on uneven surfaces activity did not occur: Safety/medical concerns         Wheelchair     Assist Will patient use wheelchair  at discharge?: Yes Type of Wheelchair: Manual Wheelchair activity did not occur: Safety/medical concerns(unable due to pt fatigue)  Wheelchair assist level: Supervision/Verbal cueing Max wheelchair distance: 150'    Wheelchair 50 feet with 2 turns activity    Assist        Assist Level: Supervision/Verbal cueing   Wheelchair 150 feet activity      Assist     Assist Level: Supervision/Verbal cueing     Medical Problem List and Plan: 1.Cognitive deficits with decreased functional mobilitysecondary toleft parietal frontal ICH with IVH  Continue CIR  [name] [name]  Team conference today to discuss current and goals and coordination of care, home and environmental barriers, and discharge planning with nursing, case manager, and therapies.  2. Antithrombotics: -DVT/anticoagulation:Subcutaneous heparin initiated 12/12/2018 -antiplatelet therapy: N/A 3. Pain Management: Tramadol DC'd  Tylenol scheduled-improvement in headaches  Low dose baclofen started on 7/1, changed to PRN on 7/2  Fioricet as needed for headache 4. Mood:Xanax 0.25 mg 3 times daily for anxiety, relatively controlled  Cont depressed, but does not want medications, stable  team will need to provide regular reassurance  Appreciate neuropsych eval, notes reviewed antipsychotic agents: N/A 5. Neuropsych: This patient ?Fullycapable of making decisions on hisown behalf. 6. Skin/Wound Care:Routine skin checks 7. Fluids/Electrolytes/Nutrition:encourage PO  BMP within normal limits on 7/6 8. Seizure disorder. Valproate 750 mg every 8 hours.   Level within normal limits on 7/6  No episodes since admission to rehab 9. History of alcohol tobacco and marijuana use. Provide counseling as appropriate 10.  Sleep disturbance, predominantly due to psychosocial stressors  Trazodone nightly  Improving overall 11.  Transaminitis  LFTs elevated on 7/3  Continue to monitor 12.  Labile blood pressure  Controlled on 7/8  LOS: 12 days A FACE TO FACE EVALUATION WAS PERFORMED  Ankit Lorie Phenix 12/31/2018, 10:00 AM

## 2019-01-01 ENCOUNTER — Inpatient Hospital Stay (HOSPITAL_COMMUNITY): Payer: Self-pay | Admitting: Speech Pathology

## 2019-01-01 ENCOUNTER — Inpatient Hospital Stay (HOSPITAL_COMMUNITY): Payer: Self-pay | Admitting: Physical Therapy

## 2019-01-01 ENCOUNTER — Inpatient Hospital Stay (HOSPITAL_COMMUNITY): Payer: Self-pay | Admitting: Occupational Therapy

## 2019-01-01 ENCOUNTER — Inpatient Hospital Stay (HOSPITAL_COMMUNITY): Payer: Self-pay

## 2019-01-01 NOTE — Progress Notes (Signed)
Occupational Therapy Session Note  Patient Details  Name: Walter Grimes MRN: [id number] Date of Birth: 1991/04/05  Today's Date: 01/01/2019 OT Individual Time: 3976-7341 OT Individual Time Calculation (min): 55 min    Short Term Goals: Week 1:  OT Short Term Goal 1 (Week 1): Pt will complete UB dressing with supervision following hemi dressing techniques for 3 consecutive sessions. OT Short Term Goal 1 - Progress (Week 1): Met OT Short Term Goal 2 (Week 1): Pt will complete LB bathing sit to stand with min assist. OT Short Term Goal 2 - Progress (Week 1): Met OT Short Term Goal 3 (Week 1): Pt will perform LB dressing with mod assist sit to stand. OT Short Term Goal 3 - Progress (Week 1): Met OT Short Term Goal 4 (Week 1): Pt will use the RUE as stabilizer for holding grooming and bathing items with min instructional cueing and supervision. OT Short Term Goal 4 - Progress (Week 1): Met OT Short Term Goal 5 (Week 1): Pt will complete toilet transfer stand pivot with mod assist. OT Short Term Goal 5 - Progress (Week 1): Met Week 2:  OT Short Term Goal 1 (Week 2): Pt will complete 1 step of toileting task OT Short Term Goal 2 (Week 2): Pt will maintain standing balance within BADL tasks with consistent min A OT Short Term Goal 3 (Week 2): Pt will complete LB dressing with no more than min A OT Short Term Goal 4 (Week 2): Pt will demonstrate self ROM of R UE with min verbal cues  Skilled Therapeutic Interventions/Progress Updates:    1:1 NMR with focus on core control/ symmetry, left LE coordination/ control, activity tolerance, forced use of left UE/LE in both supine and in standing.  On mat performed isolated hip/ knee flexion/ extension against gravity and then with contralateral side with symmetry with focus on maintaining control at midline.  4 lb weight bar exercises in supine in the distal ranges again with contralateral symmetry  Functional ambulation with focus on left knee control  and weight shifts.   Forced coordination and heavy weight bearing with stepping up and down and then sit stands with right LE up on step for increased forced use of left LE.   Ambulated back from gym to his room with min A with tactile support to avoid fully left knee hyperextension and for cue for control. Able to control need ~40% of the walk with tactile cue.  Therapy Documentation Precautions:  Precautions Precautions: Fall Restrictions Weight Bearing Restrictions: No General:   Vital Signs: Therapy Vitals Temp: 98.3 F (36.8 C) Temp Source: Oral Pulse Rate: 77 Resp: 16 BP: 111/73 Patient Position (if appropriate): Lying Oxygen Therapy SpO2: 100 % O2 Device: Room Air Pain:  no c/o pain    Therapy/Group: Individual Therapy  Willeen [facility] 01/01/2019, 9:25 AM

## 2019-01-01 NOTE — Progress Notes (Signed)
Physical Therapy Session Note  Patient Details  Name: Walter Grimes MRN: [id number] Date of Birth: February 16, 1991  Today's Date: 01/01/2019 PT Individual Time: 1000-1110 PT Individual Time Calculation (min): 70 min   Short Term Goals: Week 2:  PT Short Term Goal 1 (Week 2): Pt will ambulate 25' w/ min assist using LRAD PT Short Term Goal 2 (Week 2): Pt will initiate stair training PT Short Term Goal 3 (Week 2): Pt will maintain dynamic standing balance w/ min assist during functional mobility  Skilled Therapeutic Interventions/Progress Updates:   Pt in w/c and agreeable to therapy, no c/o pain. Pt self-propelled w/c around unit via L hemi technique. Worked on [name] NMR this session w/ emphasis on activating posterior chain musculature. NuStep 8 min @ level 2 w/ BLEs only. Tactile and verbal cues for neutral [name] alignment, overall improved since last attempt w/ this therapist. Occasional tactile cues for hamstring activation. Kinetron [name] only w/ therapist providing resistance on other pedal, 5 min. Worked on gait w/ RW, 150', w/ therapist providing tactile, verbal, and manual cues for R knee flexion in both weight acceptance and swing limb advancement. Pt able to maintain knee flexion ~10% of the time w/o assist from therapist, however suspect 2/2 heavy UE reliance on RW. Overall, decreased R knee hyperextension in stance this session. Added theraband to facilitate R knee flexion during swing, worked well first 10 steps, however pt's synergy able to overpower theraband after. Sit<>stands from w/c w/ L hand-over-R hand on R knee to facilitate increased R weight shifting during transition, min assist for balance and tactile cues for weight shifting. Worked on R weight shifting w/ RUE reaching tasks emphasizing a squat position, min assist for this as well. Attempted to perform L step downs from 3" step, however unable to perform w/o heavy UE use and max assist from therapist for balance. Returned to room via  w/c, endedsession in w/c and all needs in reach.   Therapy Documentation Precautions:  Precautions Precautions: Fall Restrictions Weight Bearing Restrictions: No Pain: Pain Assessment Pain Scale: 0-10 Pain Score: 2   Therapy/Group: Individual Therapy  Ambar Raphael [name] 01/01/2019, 12:35 PM

## 2019-01-01 NOTE — Progress Notes (Signed)
Occupational Therapy Session Note  Patient Details  Name: Walter Grimes MRN: [id number] Date of Birth: 1990/12/16  Today's Date: 01/01/2019 OT Individual Time: 1300-1330 OT Individual Time Calculation (min): 30 min    Short Term Goals: Week 2:  OT Short Term Goal 1 (Week 2): Pt will complete 1 step of toileting task OT Short Term Goal 2 (Week 2): Pt will maintain standing balance within BADL tasks with consistent min A OT Short Term Goal 3 (Week 2): Pt will complete LB dressing with no more than min A OT Short Term Goal 4 (Week 2): Pt will demonstrate self ROM of R UE with min verbal cues  Skilled Therapeutic Interventions/Progress Updates:    Pt received sitting up in w/c with no c/o pain. Pt requested to work on functional mobility during session. Pt propelled w/c 250 ft to dayroom using hemi method with mod I. Pt completed sit > stand with CGA using RW. Pt completed 2 trials of 25 ft of functional mobility with cueing and facilitation provided for R knee control and to prevent hyperextension. Pt then completed side stepping at rail to increase hip abd/add control in [name]. Pt returned to his room and completed toileting tasks. Pt completed very unsafe transfer with neither wheel locked and leg rest still in place when standing to urinate. Edu provided on fall risk. Pt was left sitting up in his w/c with the chair alarm belt fastened.   Therapy Documentation Precautions:  Precautions Precautions: Fall Restrictions Weight Bearing Restrictions: No   Therapy/Group: Individual Therapy  Curtis Sites 01/01/2019, 3:37 PM

## 2019-01-01 NOTE — Progress Notes (Signed)
[place] PHYSICAL MEDICINE & REHABILITATION PROGRESS NOTE   Subjective/Complaints: Patient seen laying in bed this morning.  He states he slept well overnight.  He is happy his discharge date was moved forward..  ROS: Denies CP, SOB, N/V/D  Objective:   No results found. No results for input(s): WBC, HGB, HCT, PLT in the last 72 hours. No results for input(s): NA, K, CL, CO2, GLUCOSE, BUN, CREATININE, CALCIUM in the last 72 hours.  Intake/Output Summary (Last 24 hours) at 01/01/2019 1436 Last data filed at 01/01/2019 1356 Gross per 24 hour  Intake 1044 ml  Output 700 ml  Net 344 ml     Physical Exam: Vital Signs Blood pressure 118/86, pulse 98, temperature 97.8 F (36.6 C), temperature source Oral, resp. rate 18, height 5\' 8"  (1.727 m), weight 74.5 kg, SpO2 95 %.  Constitutional: NAD. Vital signs reviewed.  HENT: Normocephalic.  Atraumatic. Eyes: EOMI.  No discharge. Cardiovascular: No JVD. Respiratory: Normal effort. GI: Non-distended. Musc: No edema or tenderness in extremities. Neurological: Alert Motor: RUE: 4-/5 shoulder abduction, 4--4/5 elbow flexion and handgrip, 4-/5 elbow extension, wrist extension, unchanged [name]: 3+/5 hip flexion, knee extension, 2-/5 ankle dorsiflexion with apraxia, unchanged Skin: Numerous tattoos. Psychiatric: Flat, improving  Assessment/Plan: 1. 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  Physiatrist is providing close team supervision and 24 hour management of active medical problems listed below.  Physiatrist and rehab team continue to assess barriers to discharge/monitor patient progress toward functional and medical goals  Care Tool:  Bathing    Body parts bathed by patient: Right arm, Chest, Abdomen, Right upper leg, Left upper leg, Left lower leg, Face, Left arm, Front perineal area, Right lower leg, Buttocks    Body parts bathed by helper: Front perineal area, Buttocks, Left arm, Right lower leg     Bathing assist Assist Level: Supervision/Verbal cueing     Upper Body Dressing/Undressing Upper body dressing   What is the patient wearing?: Pull over shirt    Upper body assist Assist Level: Supervision/Verbal cueing    Lower Body Dressing/Undressing Lower body dressing      What is the patient wearing?: Underwear/pull up, Pants     Lower body assist Assist for lower body dressing: Contact Guard/Touching assist     Toileting Toileting    Toileting assist Assist for toileting: Minimal Assistance - Patient > 75% Assistive Device Comment: urinal   Transfers Chair/bed transfer  Transfers assist     Chair/bed transfer assist level: Contact Guard/Touching assist     Locomotion Ambulation   Ambulation assist      Assist level: Contact Guard/Touching assist Assistive device: Walker-rolling Max distance: 150'   Walk 10 feet activity   Assist  Walk 10 feet activity did not occur: Safety/medical concerns  Assist level: Contact Guard/Touching assist Assistive device: Walker-rolling   Walk 50 feet activity   Assist Walk 50 feet with 2 turns activity did not occur: Safety/medical concerns  Assist level: Contact Guard/Touching assist Assistive device: Walker-rolling    Walk 150 feet activity   Assist Walk 150 feet activity did not occur: Safety/medical concerns  Assist level: Contact Guard/Touching assist Assistive device: Walker-rolling    Walk 10 feet on uneven surface  activity   Assist Walk 10 feet on uneven surfaces activity did not occur: Safety/medical concerns         Wheelchair     Assist Will patient use wheelchair  at discharge?: Yes Type of Wheelchair: Manual Wheelchair activity did not occur: Safety/medical concerns(unable due to pt fatigue)  Wheelchair assist level: Supervision/Verbal cueing Max wheelchair distance: 150'     Wheelchair 50 feet with 2 turns activity    Assist        Assist Level: Supervision/Verbal cueing   Wheelchair 150 feet activity     Assist     Assist Level: Supervision/Verbal cueing     Medical Problem List and Plan: 1.Cognitive deficits with decreased functional mobilitysecondary toleft parietal frontal ICH with IVH  Continue CIR  [name] [name] 2. Antithrombotics: -DVT/anticoagulation:Subcutaneous heparin initiated 12/12/2018 -antiplatelet therapy: N/A 3. Pain Management: Tramadol DC'd  Tylenol scheduled-improvement in headaches  Low dose baclofen started on 7/1, changed to PRN on 7/2  Fioricet as needed for headache 4. Mood:Xanax 0.25 mg 3 times daily for anxiety, relatively controlled on 7/9  Cont depressed, but does not want medications, stable  team will need to provide regular reassurance  Appreciate neuropsych eval, notes reviewed antipsychotic agents: N/A 5. Neuropsych: This patient ?Fullycapable of making decisions on hisown behalf. 6. Skin/Wound Care:Routine skin checks 7. Fluids/Electrolytes/Nutrition:encourage PO  BMP within normal limits on 7/6, labs ordered for tomorrow 8. Seizure disorder. Valproate 750 mg every 8 hours.   Level within normal limits on 7/6  No episodes since admission to rehab 9. History of alcohol tobacco and marijuana use. Provide counseling as appropriate 10.  Sleep disturbance, predominantly due to psychosocial stressors  Trazodone nightly  Improving overall, especially when patient willing to take medications 11.  Transaminitis  LFTs elevated on 7/3, labs ordered for tomorrow  Continue to monitor 12.  Labile blood pressure  Controlled on 7/9  LOS: 13 days A FACE TO FACE EVALUATION WAS PERFORMED  Sanam Marmo Lorie Phenix 01/01/2019, 2:36 PM

## 2019-01-02 ENCOUNTER — Inpatient Hospital Stay (HOSPITAL_COMMUNITY): Payer: Self-pay | Admitting: Physical Therapy

## 2019-01-02 ENCOUNTER — Inpatient Hospital Stay (HOSPITAL_COMMUNITY): Payer: Self-pay | Admitting: Occupational Therapy

## 2019-01-02 LAB — COMPREHENSIVE METABOLIC PANEL
ALT: 97 U/L — ABNORMAL HIGH (ref 0–44)
AST: 46 U/L — ABNORMAL HIGH (ref 15–41)
Albumin: 3.5 g/dL (ref 3.5–5.0)
Alkaline Phosphatase: 58 U/L (ref 38–126)
Anion gap: 9 (ref 5–15)
BUN: 12 mg/dL (ref 6–20)
CO2: 26 mmol/L (ref 22–32)
Calcium: 9.2 mg/dL (ref 8.9–10.3)
Chloride: 102 mmol/L (ref 98–111)
Creatinine, Ser: 0.8 mg/dL (ref 0.61–1.24)
GFR calc Af Amer: >60 mL/min (ref >60)
GFR calc non Af Amer: >60 mL/min (ref >60)
Glucose, Bld: 89 mg/dL (ref 70–99)
Potassium: 3.6 mmol/L (ref 3.5–5.1)
Sodium: 137 mmol/L (ref 135–145)
Total Bilirubin: 0.6 mg/dL (ref 0.3–1.2)
Total Protein: 6.1 g/dL — ABNORMAL LOW (ref 6.5–8.1)

## 2019-01-02 NOTE — Progress Notes (Signed)
Physical Therapy Weekly Progress Note  Patient Details  Name: Walter Grimes MRN: [id number] Date of Birth: 1990/07/12  Beginning of progress report period: December 29, 2018 End of progress report period: January 02, 2019  Today's Date: 01/02/2019 PT Individual Time: 0900-1002 AND 1100-1200 PT Individual Time Calculation (min): 62 min AND 60 min  Patient has met 3 of 3 short term goals. Pt continues to make excellent progress towards LTGs. He is performing bed mobility w/ supervision, stand pivot transfers w/ CGA, ambulating up to 150' w/ CGA, and performing stair negotiation w/ min assist. He continues to demonstrate significant extensor thrust during any R single leg stance during mobility 2/2 hamstring weakness and impaired proprioception.   Patient continues to demonstrate the following deficits muscle weakness, decreased cardiorespiratoy endurance, abnormal tone, unbalanced muscle activation and decreased coordination and decreased standing balance, hemiplegia and decreased balance strategies and therefore will continue to benefit from skilled PT intervention to increase functional independence with mobility.  Patient progressing toward long term goals..  Continue plan of care. Ambulation goals upgraded to supervision 2/2 pt progress.   PT Short Term Goals Week 2:  PT Short Term Goal 1 (Week 2): Pt will ambulate 25' w/ min assist using LRAD PT Short Term Goal 1 - Progress (Week 2): Met PT Short Term Goal 2 (Week 2): Pt will initiate stair training PT Short Term Goal 2 - Progress (Week 2): Met PT Short Term Goal 3 (Week 2): Pt will maintain dynamic standing balance w/ min assist during functional mobility PT Short Term Goal 3 - Progress (Week 2): Met Week 3:  PT Short Term Goal 1 (Week 3): =LTGs due to ELOS  Skilled Therapeutic Interventions/Progress Updates:   Session 1:  Pt in supine and agreeable to therapy, no c/o pain. Requesting to toilet. Supine>sit w/ supervision and total assist  to don shoes and RAFO. Ambulated to/from bathroom w/ CGA using RW. Toilet transfer w/ CGA-close supervision. Hand hygiene and self-care tasks while standing at sink w/ close supervision for balance. Ambulated to/from therapy gym, 150' w/ CGA using RW and RAFO. Emphasis on increasing R foot clearance, achieving knee flexion in swing and minimizing R hyperextension. Intermittent verbal cues and tactile cues at hamstring musculature for activation in swing and terminal stance. Focused on isolating glut and hamstring musculature in therapy gym. Supine bridge w/ bias to [name] use, 3x10. Towel roll added between knees to cues to keep knees together. Sidelying hip extension in gravity-minimized position, 3x10 reps. Unable to isolate hamstring in sidelying. Prone heel-to-bottom w/ active assist from therapist, 50+ reps w/ tactile cues at hamstrings. Trace contraction felt in ~10% of attempts. While ambulating back to room, added knee cage to cue knee flexion in all instances of weight acceptance and terminal stance on R side. Pt reports overall improved sensation to posterior knee. Returned to room and ended session in w/c, all needs in reach.   Session 2:   Pt in w/c and agreeable to therapy, no c/o pain. Sit<>stands to change from pants to shorts, CGA for balance and supervision for pt to don/doff. Total assist w/c transport to/from therapy gym. Performed NMES to R hamstring musculature, both in prone and then in seated for 10 min each. See parameters below. Tactile and verbal cues for volitional contraction in addition to effect of electrical stimulation. Active assist provided in both prone and seated to facilitate isolation of hamstring movement. Returned to room and ended session in w/c, all needs in reach.   Prone: -  asymmetric waveform -40 Hz   -5 sec ramp on/off -10 sec on  -400 pulse duration  -35 intensity/amp    Seated:  -same as prone, except 45 intensity/amp   Therapy Documentation Precautions:   Precautions Precautions: Fall Restrictions Weight Bearing Restrictions: No  Therapy/Group: Individual Therapy  Maryagnes Carrasco K Deana Krock 01/02/2019, 10:16 AM

## 2019-01-02 NOTE — Progress Notes (Signed)
Occupational Therapy Session Note  Patient Details  Name: Walter Grimes MRN: [id number] Date of Birth: [birth year]  Today's Date: 01/02/2019 OT Individual Time: 1245-1415 OT Individual Time Calculation (min): 90 min    Skilled Therapeutic Interventions/Progress Updates: patient completed this session as follows:  fucntional mobility room to laundryroom incorporating safety right LE weight bearing with cues and mod tactile assist;  He stood with supervision at the walker at the washing washing machine to fold his laundry...required verbal cues x1 to weight bear through R lower extremity rather to require left leg to work alone.  As well, he required cues to widen his base of support for safer dynamic and static balance.   He was able to dynamically laterally bend to retrieve clothing from dryer and put on top of washing machine.  (CGA  For safety to maintain balance)  He was able to don shoes, including right AFO with extra time to get right heel into shoe.  At the end of session he transferred w/c to bed squat pivot with close S and left with call bell and phone within reach.  Continue OT Plan of Care.  * Patient stated he wished he could have his mother or girlfriend visit.     Therapy Documentation Precautions:  Precautions Precautions: Fall Restrictions Weight Bearing Restrictions: No  Pain:denied   Therapy/Group: Individual Therapy  Alfredia Ferguson [facility] 01/02/2019, 4:01 PM

## 2019-01-02 NOTE — Plan of Care (Signed)
  Problem: RH Ambulation Goal: LTG Patient will ambulate in controlled environment (PT) Description: LTG: Patient will ambulate in a controlled environment, # of feet with assistance (PT). Flowsheets (Taken 01/02/2019 0751) LTG: Pt will ambulate in controlled environ  assist needed:: (upgraded 7/10 due to pt progress) Supervision/Verbal cueing LTG: Ambulation distance in controlled environment: 150' w/ LRAD Note: Upgraded 7/10 due to pt progress  Goal: LTG Patient will ambulate in home environment (PT) Description: LTG: Patient will ambulate in home environment, # of feet with assistance (PT). Flowsheets (Taken 01/02/2019 0751) LTG: Pt will ambulate in home environ  assist needed:: (upgraded 7/10 due to pt progress) Supervision/Verbal cueing LTG: Ambulation distance in home environment: 49' w/ LRAD Note: Upgraded 7/10 due to pt progress

## 2019-01-02 NOTE — Progress Notes (Signed)
[place] PHYSICAL MEDICINE & REHABILITATION PROGRESS NOTE   Subjective/Complaints: Patient seen laying in bed this morning.  He states he slept fairly overnight.  He is upset that people kept coming in/out of his room this morning.  ROS: Denies CP, SOB, N/V/D  Objective:   No results found. No results for input(s): WBC, HGB, HCT, PLT in the last 72 hours. Recent Labs    01/02/19 0559  NA 137  K 3.6  CL 102  CO2 26  GLUCOSE 89  BUN 12  CREATININE 0.80  CALCIUM 9.2    Intake/Output Summary (Last 24 hours) at 01/02/2019 1046 Last data filed at 01/02/2019 1033 Gross per 24 hour  Intake 666 ml  Output 350 ml  Net 316 ml     Physical Exam: Vital Signs Blood pressure 111/72, pulse 74, temperature 98.2 F (36.8 C), temperature source Oral, resp. rate 16, height 5\' 8"  (1.727 m), weight 74.5 kg, SpO2 99 %.  Constitutional: NAD. Vital signs reviewed.  HENT: Normocephalic.  Atraumatic. Eyes: EOMI.  No discharge. Cardiovascular: No JVD. Respiratory: Normal effort. GI: Non-distended. Musc: No edema or tenderness in extremities. Neurological: Alert Motor: RUE: 4-/5 shoulder abduction, 4--4/5 elbow flexion and handgrip, 4-/5 elbow extension, wrist extension, stable [name]: 3+/5 hip flexion, knee extension, 2-/5 ankle dorsiflexion with apraxia, stable Skin: Numerous tattoos. Psychiatric: Flat, improving  Assessment/Plan: 1. 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  Physiatrist is providing close team supervision and 24 hour management of active medical problems listed below.  Physiatrist and rehab team continue to assess barriers to discharge/monitor patient progress toward functional and medical goals  Care Tool:  Bathing    Body parts bathed by patient: Right arm, Chest, Abdomen, Right upper leg, Left upper leg, Left lower leg, Face, Left arm, Front  perineal area, Right lower leg, Buttocks   Body parts bathed by helper: Front perineal area, Buttocks, Left arm, Right lower leg     Bathing assist Assist Level: Supervision/Verbal cueing     Upper Body Dressing/Undressing Upper body dressing   What is the patient wearing?: Pull over shirt    Upper body assist Assist Level: Supervision/Verbal cueing    Lower Body Dressing/Undressing Lower body dressing      What is the patient wearing?: Underwear/pull up, Pants     Lower body assist Assist for lower body dressing: Contact Guard/Touching assist     Toileting Toileting    Toileting assist Assist for toileting: Minimal Assistance - Patient > 75% Assistive Device Comment: urinal   Transfers Chair/bed transfer  Transfers assist     Chair/bed transfer assist level: Contact Guard/Touching assist     Locomotion Ambulation   Ambulation assist      Assist level: Contact Guard/Touching assist Assistive device: Walker-rolling Max distance: 150'   Walk 10 feet activity   Assist  Walk 10 feet activity did not occur: Safety/medical concerns  Assist level: Contact Guard/Touching assist Assistive device: Walker-rolling   Walk 50 feet activity   Assist Walk 50 feet with 2 turns activity did not occur: Safety/medical concerns  Assist level: Contact Guard/Touching assist Assistive device: Walker-rolling    Walk 150 feet activity   Assist Walk 150 feet activity did not occur: Safety/medical concerns  Assist level: Contact Guard/Touching assist Assistive device: Walker-rolling    Walk 10 feet on uneven surface  activity   Assist Walk 10 feet on uneven surfaces activity did not occur:  Safety/medical concerns         Wheelchair     Assist Will patient use wheelchair at discharge?: Yes Type of Wheelchair: Manual Wheelchair activity did not occur: Safety/medical concerns(unable due to pt fatigue)  Wheelchair assist level: Supervision/Verbal  cueing Max wheelchair distance: 150'    Wheelchair 50 feet with 2 turns activity    Assist        Assist Level: Supervision/Verbal cueing   Wheelchair 150 feet activity     Assist     Assist Level: Supervision/Verbal cueing     Medical Problem List and Plan: 1.Cognitive deficits with decreased functional mobilitysecondary toleft parietal frontal ICH with IVH  Continue CIR  [name] [name] 2. Antithrombotics: -DVT/anticoagulation:Subcutaneous heparin initiated 12/12/2018 -antiplatelet therapy: N/A 3. Pain Management: Tramadol DC'd  Tylenol scheduled-improvement in headaches  Low dose baclofen started on 7/1, changed to PRN on 7/2  Fioricet as needed for headache  Controlled on 7/10 4. Mood:Xanax 0.25 mg 3 times daily for anxiety, relatively controlled on 7/10  Cont depressed, but does not want medications, stable  team will need to provide regular reassurance  Appreciate neuropsych eval, notes reviewed antipsychotic agents: N/A 5. Neuropsych: This patient ?Fullycapable of making decisions on hisown behalf. 6. Skin/Wound Care:Routine skin checks 7. Fluids/Electrolytes/Nutrition:encourage PO  BMP within normal limits on 7/10 8. Seizure disorder. Valproate 750 mg every 8 hours.   Level within normal limits on 7/6  No episodes since admission to rehab until 7/10 9. History of alcohol tobacco and marijuana use. Provide counseling as appropriate 10.  Sleep disturbance, predominantly due to psychosocial stressors  Trazodone nightly  Improving overall, especially when patient willing to take medications 11.  Transaminitis  LFTs elevated on 7/10, ALT continues to rise, labs ordered for Monday  Continue to monitor 12.  Labile blood pressure  Controlled on 7/10  LOS: 14 days A FACE TO FACE EVALUATION WAS PERFORMED  Zamaria Brazzle Karis Jubanil Charline Hoskinson 01/02/2019, 10:46 AM

## 2019-01-02 NOTE — Progress Notes (Signed)
Social Work Patient ID: Walter Grimes, male   DOB: June 13, 1991, 28 y.o.   MRN: [id number]   Met with pt to review team conference.  Pt aware and happy with earlier d/c date and upgrading of goals to supervision/ CGA overall.  He is still undecided about where he may d/c to but likely home with girlfriend.  He is asking about his MA and SSD applications.  Have spoken with financial counselor who will kept paperwork completed with pt by Monday at the latest.  Pt aware.  Will continue to follow.  Walter Dansby, LCSW

## 2019-01-03 ENCOUNTER — Inpatient Hospital Stay (HOSPITAL_COMMUNITY): Payer: Self-pay | Admitting: Physical Therapy

## 2019-01-03 NOTE — Progress Notes (Signed)
Physical Therapy Session Note  Patient Details  Name: Walter Grimes MRN: [id number] Date of Birth: 19-Apr-1991  Today's Date: 01/03/2019 PT Individual Time: 1005-1100 and 1405-1500 PT Individual Time Calculation (min): 55 min 55 min , \    Short Term Goals: Week 3:  PT Short Term Goal 1 (Week 3): =LTGs due to ELOS  Skilled Therapeutic Interventions/Progress Updates:  Session 1.  Pt received sitting in WC and agreeable to PT WC mobility with hemi technique x 233f and supervisoin assist for safety from PT. Also performed with BLE propulsion to force use of HS x 726f only trace activation of R LE HS   Gait training with RW 2 x 7084fith RW with moderate cues for neutral knee position to prevent GR and CGA to prevent snap back intermittently . Attempted NMES With gait, but unable to coordinate with gait pattern.   Kinetron with emphasis on HS and glute activation 4 x 45 sec.min cues for reciprocal movement pattern and reduced compensatory movements.   Patient returned to room and [facility] in reach and all needs met.     Session 2.   Pt received supine in bed and agreeable to PT. Supine>sit transfer without assist or cues   Gait training with RW x [id number] supervision A-CGA to prevent excessive GR.   Kinetron reciprocal march sitting in machine. Sustained quiet standing  3 x 1 min. mini squat with sustained hold to force co-contraction quad and HS. 3 x 8 with BUE support on rails. Min assist from PT to prevent GR.   Nustep BLE only x 6 min and BUE/BLE x 4 min, level 5>7. Cues for improved knee and hip control to prevent ER/IR with hip flexion.   Patient returned to room and [facility] reach and all needs met.           Therapy Documentation Precautions:  Precautions Precautions: Fall Restrictions Weight Bearing Restrictions: No Vital Signs: Therapy Vitals Temp: 97.7 F (36.5 C) Temp Source: Oral Pulse Rate: 80 Resp:  16 BP: (!) 118/98 Patient Position (if appropriate): Sitting Oxygen Therapy SpO2: 100 % O2 Device: Room Air Pain:    Denies   Therapy/Group: Individual Therapy  AustLorie Phenix1/2020, 10:43 PM

## 2019-01-03 NOTE — Progress Notes (Signed)
[place] PHYSICAL MEDICINE & REHABILITATION PROGRESS NOTE   Subjective/Complaints: Patient seen laying in bed this morning.  He states he slept well overnight, but is cold this morning.  Increased temperature in patient's room as it was set to 60 degrees.  ROS: Denies CP, SOB, N/V/D  Objective:   No results found. No results for input(s): WBC, HGB, HCT, PLT in the last 72 hours. Recent Labs    01/02/19 0559  NA 137  K 3.6  CL 102  CO2 26  GLUCOSE 89  BUN 12  CREATININE 0.80  CALCIUM 9.2    Intake/Output Summary (Last 24 hours) at 01/03/2019 1148 Last data filed at 01/03/2019 0819 Gross per 24 hour  Intake 1264 ml  Output -  Net 1264 ml     Physical Exam: Vital Signs Blood pressure 113/73, pulse 66, temperature 97.6 F (36.4 C), resp. rate 16, height 5\' 8"  (1.727 m), weight 74.5 kg, SpO2 100 %.  Constitutional: NAD. Vital signs reviewed.  HENT: Normocephalic.  Atraumatic. Eyes: EOMI.  No discharge. Cardiovascular: No JVD. Respiratory: Normal effort. GI: Non-distended. Musc: No edema or tenderness in extremities. Neurological: Alert Motor: RUE: 4-/5 shoulder abduction, 4--4/5 elbow flexion and handgrip, 4-/5 elbow extension, wrist extension, unchanged [name]: 3+/5 hip flexion, knee extension, 2-/5 ankle dorsiflexion with apraxia, unchanged Skin: Numerous tattoos. Psychiatric: Flat, improving  Assessment/Plan: 1. 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  Physiatrist is providing close team supervision and 24 hour management of active medical problems listed below.  Physiatrist and rehab team continue to assess barriers to discharge/monitor patient progress toward functional and medical goals  Care Tool:  Bathing    Body parts bathed by patient: Right arm, Chest, Abdomen, Right upper leg, Left upper leg, Left lower leg, Face, Left arm,  Front perineal area, Right lower leg, Buttocks   Body parts bathed by helper: Front perineal area, Buttocks, Left arm, Right lower leg     Bathing assist Assist Level: Supervision/Verbal cueing     Upper Body Dressing/Undressing Upper body dressing   What is the patient wearing?: Pull over shirt    Upper body assist Assist Level: Supervision/Verbal cueing    Lower Body Dressing/Undressing Lower body dressing      What is the patient wearing?: Underwear/pull up, Pants     Lower body assist Assist for lower body dressing: Contact Guard/Touching assist     Toileting Toileting    Toileting assist Assist for toileting: Minimal Assistance - Patient > 75% Assistive Device Comment: urinal   Transfers Chair/bed transfer  Transfers assist     Chair/bed transfer assist level: Contact Guard/Touching assist     Locomotion Ambulation   Ambulation assist      Assist level: Contact Guard/Touching assist Assistive device: Walker-rolling Max distance: 150'   Walk 10 feet activity   Assist  Walk 10 feet activity did not occur: Safety/medical concerns  Assist level: Contact Guard/Touching assist Assistive device: Walker-rolling   Walk 50 feet activity   Assist Walk 50 feet with 2 turns activity did not occur: Safety/medical concerns  Assist level: Contact Guard/Touching assist Assistive device: Walker-rolling    Walk 150 feet activity   Assist Walk 150 feet activity did not occur: Safety/medical concerns  Assist level: Contact Guard/Touching assist Assistive device: Walker-rolling    Walk 10 feet on uneven surface  activity   Assist Walk 10 feet on uneven surfaces activity did not occur:  Safety/medical concerns         Wheelchair     Assist Will patient use wheelchair at discharge?: Yes Type of Wheelchair: Manual Wheelchair activity did not occur: Safety/medical concerns(unable due to pt fatigue)  Wheelchair assist level: Supervision/Verbal  cueing Max wheelchair distance: 150'    Wheelchair 50 feet with 2 turns activity    Assist        Assist Level: Supervision/Verbal cueing   Wheelchair 150 feet activity     Assist     Assist Level: Supervision/Verbal cueing     Medical Problem List and Plan: 1.Cognitive deficits with decreased functional mobilitysecondary toleft parietal frontal ICH with IVH  Continue CIR  [name] [name] 2. Antithrombotics: -DVT/anticoagulation:Subcutaneous heparin initiated 12/12/2018 -antiplatelet therapy: N/A 3. Pain Management: Tramadol DC'd  Tylenol scheduled-improvement in headaches  Low dose baclofen started on 7/1, changed to PRN on 7/2  Fioricet as needed for headache  Controlled on 7/11 4. Mood:Xanax 0.25 mg 3 times daily for anxiety, relatively controlled on 7/11  Continues to be depressed, but does not want medications, stable  team will need to provide regular reassurance  Appreciate neuropsych eval, notes reviewed antipsychotic agents: N/A 5. Neuropsych: This patient ?Fullycapable of making decisions on hisown behalf. 6. Skin/Wound Care:Routine skin checks 7. Fluids/Electrolytes/Nutrition:encourage PO  BMP within normal limits on 7/10 8. Seizure disorder. Valproate 750 mg every 8 hours.   Level within normal limits on 7/6, plan to repeat levels next week  No episodes since admission to rehab until 7/10 9. History of alcohol tobacco and marijuana use. Provide counseling as appropriate 10.  Sleep disturbance, predominantly due to psychosocial stressors  Trazodone nightly  Improving overall, especially when patient willing to take medications 11.  Transaminitis  LFTs elevated on 7/10, ALT continues to rise, labs ordered for Monday  Continue to monitor 12.  Labile blood pressure  Controlled on 7/11  LOS: 15 days A FACE TO FACE EVALUATION WAS PERFORMED  Magdelene Ruark Lorie Phenix 01/03/2019, 11:48 AM

## 2019-01-04 NOTE — Progress Notes (Signed)
At around 8 pm, this RN was called into patient's room to talk to patient because patient wanted to go home AMA. This RN went into the room and asked patient why he wanted to leave AMA? Patient told this RN he wanted leave because PT was not working on his leg, his mom just started HD, his aunt is not doing ok. After listening to patient, this RN was able to talk to patient not to leave. This RN explained to patient the advantages and disadvantages of not going AMA.After a long conversation patient finally decided to stay. We continue to monitor

## 2019-01-04 NOTE — Progress Notes (Signed)
[facility] PHYSICAL MEDICINE & REHABILITATION PROGRESS NOTE   Subjective/Complaints: Patient seen laying in bed this morning.  He states he is irritated as a result of being in the hospital.  Patient wanted to leave AMA last night per nursing discussed with nursing.  ROS: Denies CP, SOB, N/V/D  Objective:   No results found. No results for input(s): WBC, HGB, HCT, PLT in the last 72 hours. Recent Labs    01/02/19 0559  NA 137  K 3.6  CL 102  CO2 26  GLUCOSE 89  BUN 12  CREATININE 0.80  CALCIUM 9.2    Intake/Output Summary (Last 24 hours) at 01/04/2019 1204 Last data filed at 01/04/2019 0908 Gross per 24 hour  Intake 1008 ml  Output -  Net 1008 ml     Physical Exam: Vital Signs Blood pressure 110/72, pulse 71, temperature 97.8 F (36.6 C), resp. rate 16, height 5\' 8"  (1.727 m), weight 74.5 kg, SpO2 100 %.  Constitutional: NAD. Vital signs reviewed.  HENT: Normocephalic.  Atraumatic. Eyes: EOMI.  No discharge. Cardiovascular: No JVD. Respiratory: Normal effort. GI: Non-distended. Musc: No edema or tenderness in extremities. Neurological: Alert Motor: RUE: 4-/5 shoulder abduction, 4--4/5 elbow flexion and handgrip, 4-/5 elbow extension, wrist extension, stable [name]: 3+/5 hip flexion, knee extension, 2-/5 ankle dorsiflexion with apraxia, stable Skin: Numerous tattoos. Psychiatric: Flat, improving  Assessment/Plan: 1. 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  Physiatrist is providing close team supervision and 24 hour management of active medical problems listed below.  Physiatrist and rehab team continue to assess barriers to discharge/monitor patient progress toward functional and medical goals  Care Tool:  Bathing    Body parts bathed by patient: Right arm, Chest, Abdomen, Right upper leg, Left upper leg, Left lower leg, Face, Left arm,  Front perineal area, Right lower leg, Buttocks   Body parts bathed by helper: Front perineal area, Buttocks, Left arm, Right lower leg     Bathing assist Assist Level: Supervision/Verbal cueing     Upper Body Dressing/Undressing Upper body dressing   What is the patient wearing?: Pull over shirt    Upper body assist Assist Level: Supervision/Verbal cueing    Lower Body Dressing/Undressing Lower body dressing      What is the patient wearing?: Underwear/pull up, Pants     Lower body assist Assist for lower body dressing: Contact Guard/Touching assist     Toileting Toileting    Toileting assist Assist for toileting: Minimal Assistance - Patient > 75% Assistive Device Comment: urinal   Transfers Chair/bed transfer  Transfers assist     Chair/bed transfer assist level: Contact Guard/Touching assist     Locomotion Ambulation   Ambulation assist      Assist level: Contact Guard/Touching assist Assistive device: Walker-rolling Max distance: 150'   Walk 10 feet activity   Assist  Walk 10 feet activity did not occur: Safety/medical concerns  Assist level: Contact Guard/Touching assist Assistive device: Walker-rolling   Walk 50 feet activity   Assist Walk 50 feet with 2 turns activity did not occur: Safety/medical concerns  Assist level: Contact Guard/Touching assist Assistive device: Walker-rolling    Walk 150 feet activity   Assist Walk 150 feet activity did not occur: Safety/medical concerns  Assist level: Contact Guard/Touching assist Assistive device: Walker-rolling    Walk 10 feet on uneven surface  activity   Assist Walk 10 feet on uneven surfaces activity did  not occur: Safety/medical concerns         Wheelchair     Assist Will patient use wheelchair at discharge?: Yes Type of Wheelchair: Manual Wheelchair activity did not occur: Safety/medical concerns(unable due to pt fatigue)  Wheelchair assist level: Supervision/Verbal  cueing Max wheelchair distance: 150'    Wheelchair 50 feet with 2 turns activity    Assist        Assist Level: Supervision/Verbal cueing   Wheelchair 150 feet activity     Assist     Assist Level: Supervision/Verbal cueing     Medical Problem List and Plan: 1.Cognitive deficits with decreased functional mobilitysecondary toleft parietal frontal ICH with IVH  Continue CIR  [name] [name] 2. Antithrombotics: -DVT/anticoagulation:Subcutaneous heparin initiated 12/12/2018 -antiplatelet therapy: N/A 3. Pain Management: Tramadol DC'd  Tylenol scheduled-improvement in headaches  Low dose baclofen started on 7/1, changed to PRN on 7/2  Fioricet as needed for headache  Controlled on 7/12 4. Mood:Xanax 0.25 mg 3 times daily for anxiety, relatively controlled on 7/11  Continues to be depressed, but does not want medications, stable  Needs consistent reassurance  Educate on coping mechanisms  Appreciate neuropsych eval, notes reviewed antipsychotic agents: N/A 5. Neuropsych: This patient ?Fullycapable of making decisions on hisown behalf. 6. Skin/Wound Care:Routine skin checks 7. Fluids/Electrolytes/Nutrition:encourage PO  BMP within normal limits on 7/10 8. Seizure disorder. Valproate 750 mg every 8 hours.   Level within normal limits on 7/6, plan to repeat levels this week  No episodes since admission to rehab until 7/10 9. History of alcohol tobacco and marijuana use. Provide counseling as appropriate 10.  Sleep disturbance, predominantly due to psychosocial stressors  Trazodone nightly  Improving overall, especially when patient willing to take medications 11.  Transaminitis  LFTs elevated on 7/10, ALT continues to rise, labs ordered for tomorrow  Continue to monitor 12.  Labile blood pressure  Controlled on 1/12  LOS: 16 days A FACE TO FACE EVALUATION WAS PERFORMED   Lorie Phenix 01/04/2019, 12:04 PM

## 2019-01-05 ENCOUNTER — Inpatient Hospital Stay (HOSPITAL_COMMUNITY): Payer: Self-pay | Admitting: Physical Therapy

## 2019-01-05 ENCOUNTER — Inpatient Hospital Stay (HOSPITAL_COMMUNITY): Payer: Self-pay | Admitting: Occupational Therapy

## 2019-01-05 LAB — COMPREHENSIVE METABOLIC PANEL
ALT: 76 U/L — ABNORMAL HIGH (ref 0–44)
AST: 45 U/L — ABNORMAL HIGH (ref 15–41)
Albumin: 4.2 g/dL (ref 3.5–5.0)
Alkaline Phosphatase: 66 U/L (ref 38–126)
Anion gap: 8 (ref 5–15)
BUN: 10 mg/dL (ref 6–20)
CO2: 29 mmol/L (ref 22–32)
Calcium: 9.8 mg/dL (ref 8.9–10.3)
Chloride: 102 mmol/L (ref 98–111)
Creatinine, Ser: 1.06 mg/dL (ref 0.61–1.24)
GFR calc Af Amer: >60 mL/min (ref >60)
GFR calc non Af Amer: >60 mL/min (ref >60)
Glucose, Bld: 98 mg/dL (ref 70–99)
Potassium: 4.1 mmol/L (ref 3.5–5.1)
Sodium: 139 mmol/L (ref 135–145)
Total Bilirubin: 0.5 mg/dL (ref 0.3–1.2)
Total Protein: 7.4 g/dL (ref 6.5–8.1)

## 2019-01-05 LAB — VALPROIC ACID LEVEL: Valproic Acid Lvl: 55 ug/mL (ref 50.0–100.0)

## 2019-01-05 NOTE — Progress Notes (Signed)
[name] PHYSICAL MEDICINE & REHABILITATION PROGRESS NOTE   Subjective/Complaints: Patient seen sitting up at the edge of his bed this morning.  Good sitting balance.  He states he slept poorly overnight.  ROS: Denies CP, SOB, N/V/D  Objective:   No results found. No results for input(s): WBC, HGB, HCT, PLT in the last 72 hours. No results for input(s): NA, K, CL, CO2, GLUCOSE, BUN, CREATININE, CALCIUM in the last 72 hours. No intake or output data in the 24 hours ending 01/05/19 6160   Physical Exam: Vital Signs Blood pressure (!) 138/92, pulse 85, temperature 97.6 F (36.4 C), temperature source Oral, resp. rate 19, height 5\' 8"  (1.727 m), weight 74.5 kg, SpO2 100 %.  Constitutional: NAD. Vital signs reviewed.  HENT: Normocephalic.  Atraumatic. Eyes: EOMI.  No discharge. Cardiovascular: No JVD. Respiratory: Normal effort. GI: Non-distended. Musc: No edema or tenderness in extremities. Neurological: Alert Motor: RUE: 4-/5 shoulder abduction, 4--4/5 elbow flexion and handgrip, 4-/5 elbow extension, wrist extension, stable [name]: 3+/5 hip flexion, knee extension, 2-/5 ankle dorsiflexion with apraxia, stable Skin: Numerous tattoos. Psychiatric: Flat, improving  Assessment/Plan: 1. 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  Physiatrist is providing close team supervision and 24 hour management of active medical problems listed below.  Physiatrist and rehab team continue to assess barriers to discharge/monitor patient progress toward functional and medical goals  Care Tool:  Bathing    Body parts bathed by patient: Right arm, Chest, Abdomen, Right upper leg, Left upper leg, Left lower leg, Face, Left arm, Front perineal area, Right lower leg, Buttocks   Body parts bathed by helper: Front perineal area, Buttocks, Left arm, Right lower leg     Bathing  assist Assist Level: Supervision/Verbal cueing     Upper Body Dressing/Undressing Upper body dressing   What is the patient wearing?: Pull over shirt    Upper body assist Assist Level: Supervision/Verbal cueing    Lower Body Dressing/Undressing Lower body dressing      What is the patient wearing?: Underwear/pull up, Pants     Lower body assist Assist for lower body dressing: Contact Guard/Touching assist     Toileting Toileting    Toileting assist Assist for toileting: Supervision/Verbal cueing Assistive Device Comment: urinal   Transfers Chair/bed transfer  Transfers assist     Chair/bed transfer assist level: Supervision/Verbal cueing     Locomotion Ambulation   Ambulation assist      Assist level: Contact Guard/Touching assist Assistive device: Walker-rolling Max distance: 150'   Walk 10 feet activity   Assist  Walk 10 feet activity did not occur: Safety/medical concerns  Assist level: Contact Guard/Touching assist Assistive device: Walker-rolling   Walk 50 feet activity   Assist Walk 50 feet with 2 turns activity did not occur: Safety/medical concerns  Assist level: Contact Guard/Touching assist Assistive device: Walker-rolling    Walk 150 feet activity   Assist Walk 150 feet activity did not occur: Safety/medical concerns  Assist level: Contact Guard/Touching assist Assistive device: Walker-rolling    Walk 10 feet on uneven surface  activity   Assist Walk 10 feet on uneven surfaces activity did not occur: Safety/medical concerns         Wheelchair     Assist Will patient use wheelchair at discharge?: Yes Type of Wheelchair: Manual Wheelchair activity did not occur: Safety/medical concerns(unable due to pt fatigue)  Wheelchair assist level: Supervision/Verbal cueing Max wheelchair  distance: 150'    Wheelchair 50 feet with 2 turns activity    Assist        Assist Level: Supervision/Verbal cueing    Wheelchair 150 feet activity     Assist     Assist Level: Supervision/Verbal cueing     Medical Problem List and Plan: 1.Cognitive deficits with decreased functional mobilitysecondary toleft parietal frontal ICH with IVH  Continue CIR  [name] [name] 2. Antithrombotics: -DVT/anticoagulation:Subcutaneous heparin initiated 12/12/2018 -antiplatelet therapy: N/A 3. Pain Management: Tramadol DC'd  Tylenol scheduled-improvement in headaches  Low dose baclofen started on 7/1, changed to PRN on 7/2  Fioricet as needed for headache  Controlled on 7/12 4. Mood:Xanax 0.25 mg 3 times daily for anxiety, relatively controlled on 7/11  Continues to be depressed, but does not want medications, stable  Needs consistent reassurance  Educate on coping mechanisms  Appreciate neuropsych eval, notes reviewed antipsychotic agents: N/A 5. Neuropsych: This patient ?Fullycapable of making decisions on hisown behalf. 6. Skin/Wound Care:Routine skin checks 7. Fluids/Electrolytes/Nutrition:encourage PO  BMP within normal limits on 7/10 8. Seizure disorder. Valproate 750 mg every 8 hours.   Level within normal limits on 7/6, plan to repeat levels this week  No episodes since admission to rehab until 7/10 9. History of alcohol tobacco and marijuana use. Provide counseling as appropriate 10.  Sleep disturbance, predominantly due to psychosocial stressors  Trazodone nightly  Improving overall, especially when patient willing to take medications 11.  Transaminitis  LFTs elevated on 7/10, ALT continues to rise, labs ordered for tomorrow  Continue to monitor 12.  Labile blood pressure  Controlled on 1/12  LOS: 17 days A FACE TO FACE EVALUATION WAS PERFORMED  Ankit Karis JubaAnil Patel 01/05/2019, 9:22 AM

## 2019-01-05 NOTE — Progress Notes (Signed)
Occupational Therapy Session Note  Patient Details  Name: Walter Grimes MRN: [id number] Date of Birth: 30-Sep-1990  Today's Date: 01/05/2019  Session 1 OT Individual Time: 5643-3295 OT Individual Time Calculation (min): 72 min   Session 2 OT Individual Time: 1884-1660 OT Individual Time Calculation (min): 75 min   Short Term Goals: Week 2:  OT Short Term Goal 1 (Week 2): Pt will complete 1 step of toileting task OT Short Term Goal 2 (Week 2): Pt will maintain standing balance within BADL tasks with consistent min A OT Short Term Goal 3 (Week 2): Pt will complete LB dressing with no more than min A OT Short Term Goal 4 (Week 2): Pt will demonstrate self ROM of R UE with min verbal cues  Skilled Therapeutic Interventions/Progress Updates:  Session 1   Pt greeted propelling wc in hallway and agreeable to OT Treatment session focused on modified bathing/dressing. Pt ambulated into bathroom w/ RW and close supervision/CGA. Pt sat on edge of tub bench to doff clothing with supervision. CGA for balance when standing to remove pants. Bathing completed with focus on forced use of R UE within BADL tasks. Pt with much improved proximal shoulder strength as well as fine motor control to open containers and maintain grip on wash cloth. Stand-pivot transfer out of shower to wc with supervision. Dressing completed with increased time and supervision for standing balance when pulling up pants. Good recall of hemi dressing techniques.  Worked on donning R AFO with increased time and effort, but pt eventually able to don. Discussed home dc plan with pt still contemplating going to gf's house at dc with full flight of steps. Discussed safer dc plan to his mothers house which pt states is being renovated, but he would likely still be able to stay there. Pt propelled wc to therapy gym. Worked on standing balance and UB there-ex using 1 lb dowel rod. Mirror used for visual feedback and body/postural awareness. 3  sets of 10 bicep curls, upright row, and straight arm raises in standing. Pt propelled wc back to room and completed stand-pivot back to bed with supervision. Pt left with bed alarm on and needs met.   Session 2 Pt greeted semi-reclined in bed and agreeable to OT treatment session. Pt donned shoes and R AFO with increased time and supervision using figure 4 position. Stand-pivot to wc with supervision. Pt propelled wc to therapy gym. Stand-pivot to [employer] arm bike, but pt began to pivot and forgot to move R LE with body. Pt unable to advance R LE so OT brought wc behind pt to return to sitting. Utilized RW for stand-pivot to get to arm bike chair with close supervision. Pt completed 10 mins of work on [employer] arm bike on level 4 for B UE strengthening. One two minute rest break. Pt propelled wc to dayroom w/ supervision. RW used for stand-pivot to [employer]. Pt completed 10 mins on [employer] with 3 rest breaks. Standing balance/endurance and R UE NMR with Wii bowling activity in standing w/o AD. Focus on swing through and accuracy with R UE when bowling. Pt then ambulated 40 feet w/ RW, CGA. Pt propelled wc back to room and requested to stay in wc. Chair alarm on and needs met.   Therapy Documentation Precautions:  Precautions Precautions: Fall Restrictions Weight Bearing Restrictions: No Pain:  denies pain   Therapy/Group: Individual Therapy  Valma Cava 01/05/2019, 3:38 PM

## 2019-01-05 NOTE — Progress Notes (Signed)
[facility] PHYSICAL MEDICINE & REHABILITATION PROGRESS NOTE   Subjective/Complaints: Patient seen sitting up at the edge of his bed this morning.  Good sitting balance noted.  He states he slept fairly overnight.  ROS: Denies CP, SOB, N/V/D  Objective:   No results found. No results for input(s): WBC, HGB, HCT, PLT in the last 72 hours. Recent Labs    01/05/19 1134  NA 139  K 4.1  CL 102  CO2 29  GLUCOSE 98  BUN 10  CREATININE 1.06  CALCIUM 9.8    Intake/Output Summary (Last 24 hours) at 01/05/2019 1215 Last data filed at 01/05/2019 0900 Gross per 24 hour  Intake 240 ml  Output -  Net 240 ml     Physical Exam: Vital Signs Blood pressure (!) 138/92, pulse 85, temperature 97.6 F (36.4 C), temperature source Oral, resp. rate 19, height 5\' 8"  (1.727 m), weight 74.5 kg, SpO2 100 %.  Constitutional: NAD. Vital signs reviewed.  HENT: Normocephalic.  Atraumatic. Eyes: EOMI.  No discharge. Cardiovascular: No JVD. Respiratory: Normal effort. GI: Non-distended. Musc: No edema or tenderness in extremities. Neurological: Alert Motor: RUE: 4-/5 shoulder abduction, 4--4/5 elbow flexion and handgrip, 4-/5 elbow extension, wrist extension, unchanged [name]: 3+/5 hip flexion, knee extension, 2-/5 ankle dorsiflexion with apraxia, unchanged Skin: Numerous tattoos. Psychiatric: Flat, improving  Assessment/Plan: 1. 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  Physiatrist is providing close team supervision and 24 hour management of active medical problems listed below.  Physiatrist and rehab team continue to assess barriers to discharge/monitor patient progress toward functional and medical goals  Care Tool:  Bathing    Body parts bathed by patient: Right arm, Chest, Abdomen, Right upper leg, Left upper leg, Left lower leg, Face, Left arm, Front perineal area,  Right lower leg, Buttocks   Body parts bathed by helper: Front perineal area, Buttocks, Left arm, Right lower leg     Bathing assist Assist Level: Supervision/Verbal cueing     Upper Body Dressing/Undressing Upper body dressing   What is the patient wearing?: Pull over shirt    Upper body assist Assist Level: Supervision/Verbal cueing    Lower Body Dressing/Undressing Lower body dressing      What is the patient wearing?: Underwear/pull up, Pants     Lower body assist Assist for lower body dressing: Contact Guard/Touching assist     Toileting Toileting    Toileting assist Assist for toileting: Supervision/Verbal cueing Assistive Device Comment: urinal   Transfers Chair/bed transfer  Transfers assist     Chair/bed transfer assist level: Supervision/Verbal cueing     Locomotion Ambulation   Ambulation assist      Assist level: Contact Guard/Touching assist Assistive device: Walker-rolling Max distance: 150'   Walk 10 feet activity   Assist  Walk 10 feet activity did not occur: Safety/medical concerns  Assist level: Contact Guard/Touching assist Assistive device: Walker-rolling   Walk 50 feet activity   Assist Walk 50 feet with 2 turns activity did not occur: Safety/medical concerns  Assist level: Contact Guard/Touching assist Assistive device: Walker-rolling    Walk 150 feet activity   Assist Walk 150 feet activity did not occur: Safety/medical concerns  Assist level: Contact Guard/Touching assist Assistive device: Walker-rolling    Walk 10 feet on uneven surface  activity   Assist Walk 10 feet on uneven surfaces activity did not occur: Safety/medical concerns  Wheelchair     Assist Will patient use wheelchair at discharge?: Yes Type of Wheelchair: Manual Wheelchair activity did not occur: Safety/medical concerns(unable due to pt fatigue)  Wheelchair assist level: Supervision/Verbal cueing Max wheelchair distance:  150'    Wheelchair 50 feet with 2 turns activity    Assist        Assist Level: Supervision/Verbal cueing   Wheelchair 150 feet activity     Assist     Assist Level: Supervision/Verbal cueing     Medical Problem List and Plan: 1.Cognitive deficits with decreased functional mobilitysecondary toleft parietal frontal ICH with IVH  Continue CIR  [name] [name] 2. Antithrombotics: -DVT/anticoagulation:Subcutaneous heparin initiated 12/12/2018 -antiplatelet therapy: N/A 3. Pain Management: Tramadol DC'd  Tylenol scheduled-improvement in headaches  Low dose baclofen started on 7/1, changed to PRN on 7/2  Fioricet as needed for headache  Controlled on 7/13 4. Mood:Xanax 0.25 mg 3 times daily for anxiety, relatively controlled on 7/13  Continues to be depressed, but does not want medications, stable  Needs consistent reassurance  Educate on coping mechanisms  Appreciate neuropsych eval, notes reviewed antipsychotic agents: N/A 5. Neuropsych: This patient ?Fullycapable of making decisions on hisown behalf. 6. Skin/Wound Care:Routine skin checks 7. Fluids/Electrolytes/Nutrition:encourage PO  BMP within normal limits on 7/10 8. Seizure disorder. Valproate 750 mg every 8 hours.   Level within normal limits on 7/6, repeat levels ordered for tomorrow  No episodes since admission to rehab 9. History of alcohol tobacco and marijuana use. Provide counseling as appropriate 10.  Sleep disturbance, predominantly due to psychosocial stressors  Trazodone nightly  Improving overall, especially when patient willing to take medications 11.  Transaminitis  LFTs elevated, but improving on 7/13  Continue to monitor 12.  Labile blood pressure  Controlled on 7/13  LOS: 17 days A FACE TO FACE EVALUATION WAS PERFORMED  Ankit Karis Jubanil Patel 01/05/2019, 12:15 PM

## 2019-01-05 NOTE — Progress Notes (Signed)
Physical Therapy Session Note  Patient Details  Name: Walter Grimes MRN: [id number] Date of Birth: [birth year]  Today's Date: 01/05/2019 PT Individual Time: 9794-9971 PT Individual Time Calculation (min): 40 min   Short Term Goals: Week 3:  PT Short Term Goal 1 (Week 3): =LTGs due to ELOS  Skilled Therapeutic Interventions/Progress Updates: Pt presented coming out of bathroom with RW (having used call light) stating brace feels wrong. Pt stood at sink and washed hands with noted genu recurvatum in [name] and transferred to w/c close S. PTA noted strap improperly placed and readjusted with pt stating better fit. Pt expressed frustration at being in rehab but also verbalized that he also understands and just wants to d/c to be there for his mom who is sick and has recently started HD. Pt propelled to day room mod I and performed stand pivot transfer to NuStep close S. Participated in NuStep x 33mn total L7 with x 121m x 4 extremities for global strengthening and PTA providing verbal cues to attempt to maintain R knee in neutral and x 5 min with BLE only for BLE strengthening and increased forced use of hamstring with emphasis on full extension. Pt returned to w/c and propelled back to room in same manner as prior. Pt left in room in w/c and left with call bell within reach and needs met.       Therapy Documentation Precautions:  Precautions Precautions: Fall Restrictions Weight Bearing Restrictions: No General:   Vital Signs:  Pain:     Therapy/Group: Individual Therapy  Walter Grimes  Walter Grimes, PTA  01/05/2019, 4:01 PM

## 2019-01-06 ENCOUNTER — Inpatient Hospital Stay (HOSPITAL_COMMUNITY): Payer: Self-pay | Admitting: Physical Therapy

## 2019-01-06 ENCOUNTER — Inpatient Hospital Stay (HOSPITAL_COMMUNITY): Payer: Self-pay | Admitting: Occupational Therapy

## 2019-01-06 LAB — VALPROIC ACID LEVEL: Valproic Acid Lvl: 66 ug/mL (ref 50.0–100.0)

## 2019-01-06 MED ORDER — BACLOFEN 5 MG PO TABS: 5.0000 mg | ORAL_TABLET | Freq: Three times a day (TID) | ORAL | 0 refills | Status: AC | PRN

## 2019-01-06 MED ORDER — BUTALBITAL-APAP-CAFFEINE 50-325-40 MG PO TABS: 1.0000 | ORAL_TABLET | Freq: Three times a day (TID) | ORAL | 0 refills | Status: AC | PRN

## 2019-01-06 MED ORDER — ALPRAZOLAM 0.25 MG PO TABS: 0.2500 mg | ORAL_TABLET | Freq: Three times a day (TID) | ORAL | 0 refills | Status: AC | PRN

## 2019-01-06 MED ORDER — TRAZODONE HCL 50 MG PO TABS: 50.0000 mg | ORAL_TABLET | Freq: Every day | ORAL | 0 refills | Status: AC

## 2019-01-06 MED ORDER — VALPROIC ACID 250 MG PO CAPS: 750.0000 mg | ORAL_CAPSULE | Freq: Three times a day (TID) | ORAL | 0 refills | Status: AC

## 2019-01-06 MED ORDER — ACETAMINOPHEN 325 MG PO TABS
650.0000 mg | ORAL_TABLET | Freq: Four times a day (QID) | ORAL | Status: DC | PRN
  Administered 2019-01-06 – 2019-01-07 (×2): 650 mg via ORAL
  Filled 2019-01-06: qty 2

## 2019-01-06 MED ORDER — ACETAMINOPHEN 325 MG PO TABS: 650.0000 mg | ORAL_TABLET | Freq: Four times a day (QID) | ORAL | Status: AC | PRN

## 2019-01-06 NOTE — Progress Notes (Signed)
Physical Therapy Discharge Summary  Patient Details  Name: Walter Grimes MRN: [id number] Date of Birth: 1990/10/28  Today's Date: 01/06/2019 PT Individual Time: 2025-4270 PT Individual Time Calculation (min): 54 min    Pt in w/c and agreeable to therapy, no c/o pain. Pt self-propelled w/c to/from therapy gym. Worked on [name] NMR while at rail in hallway. Performed partial knee bends and R single leg squats w/ emphasis on eccentric control and boosting back up to standing w/ weight shifted to R side. Ambulated 30' forward and 30' backward w/ tactile cues at R hamstring musculature during weight acceptance and facilitation of R knee flexion during swing. R/L side stepping w/ tactile cues at R hamstring during weight acceptance. Returned to room via w/c and provided set-up assist to change into shorts. NMES to R hamstring performed in prone (see parameters below). Tactile, verbal, and manual cues to facilitate knee flexion during "on" periods. Strong palpable contraction felt. Returned to seated in w/c, ended session in w/c and all needs in reach.   -asymmetric waveform -40 Hz   -5 sec ramp on/off -10 sec on  -400 pulse duration  -40 intensity/amp    Patient has met 10 of 10 long term goals due to improved balance, improved postural control, increased strength, ability to compensate for deficits, functional use of  right upper extremity and right lower extremity and improved coordination.  Patient to discharge at an ambulatory level Supervision.   Patient's care partner is independent to provide the necessary physical assistance at discharge. Pt's family and girlfriend planning to arrange for 24/7 supervision, pt is adamant that he will never be home alone. Pt is able to direct his care appropriately and demonstrates adequate safety awareness.   Reasons goals not met: n/a  Recommendation:  Patient will benefit from ongoing skilled PT services in outpatient setting to continue to advance safe  functional mobility, address ongoing impairments in [name] strength and proprioception, functional balance, and quality of gait, and minimize fall risk.  Equipment: RW, w/c, RAFO  Reasons for discharge: treatment goals met and discharge from hospital  Patient/family agrees with progress made and goals achieved: Yes  PT Discharge Precautions/Restrictions Precautions Precautions: None Restrictions Weight Bearing Restrictions: No Pain Pain Assessment Pain Scale: 0-10 Pain Score: 0-No pain Faces Pain Scale: No hurt Vision/Perception  Vision - Assessment Eye Alignment: Within Functional Limits Ocular Range of Motion: Within Functional Limits Alignment/Gaze Preference: Within Defined Limits Tracking/Visual Pursuits: Able to track stimulus in all quads without difficulty Saccades: Within functional limits Convergence: Within functional limits Perception Perception: Within Functional Limits Praxis Praxis: Intact  Cognition Overall Cognitive Status: Within Functional Limits for tasks assessed Arousal/Alertness: Awake/alert Orientation Level: Oriented X4 Focused Attention: Appears intact Sustained Attention: Appears intact Selective Attention: Appears intact Memory: Impaired Memory Impairment: Decreased recall of new information(pt states this is his baseline) Awareness: Appears intact Problem Solving: Appears intact Reasoning: Appears intact Organizing: Appears intact Behaviors: Poor frustration tolerance(appears to be baseline) Safety/Judgment: Appears intact Sensation Sensation Light Touch: Appears Intact Proprioception Impaired Details: Impaired [name] Coordination Gross Motor Movements are Fluid and Coordinated: No Coordination and Movement Description: R hemi affecting coordination Heel Shin Test: Unable to perform 2/2 weakness on R side Motor  Motor Motor: Hemiplegia;Other (comment) Motor - Discharge Observations: R hemi remains, pt w/ occasional extensor synergy in  [name], unable to isolate muscle groups  Mobility Bed Mobility Bed Mobility: Rolling Right;Rolling Left;Supine to Sit;Sit to Supine Rolling Right: Independent Rolling Left: Independent Supine to Sit: Independent Sit to  Supine: Independent Transfers Transfers: Sit to Stand;Stand to Sit;Stand Pivot Transfers Sit to Stand: Supervision/Verbal cueing Stand to Sit: Supervision/Verbal cueing Stand Pivot Transfers: Supervision/Verbal cueing Transfer (Assistive device): None Locomotion  Gait Ambulation: Yes Gait Assistance: Supervision/Verbal cueing Gait Distance (Feet): 150 Feet Assistive device: Rolling walker Gait Gait: Yes Gait Pattern: Impaired Gait Pattern: Decreased dorsiflexion - right;Narrow base of support Gait velocity: decreased Stairs / Additional Locomotion Stairs: Yes Stairs Assistance: Contact Guard/Touching assist Stair Management Technique: One rail Left Number of Stairs: 12 Height of Stairs: 3 Wheelchair Mobility Wheelchair Mobility: Yes Wheelchair Assistance: Independent with assistive device Wheelchair Propulsion: Left upper extremity;Left lower extremity Wheelchair Parts Management: Independent Distance: 150'  Trunk/Postural Assessment  Cervical Assessment Cervical Assessment: Within Functional Limits Thoracic Assessment Thoracic Assessment: Within Functional Limits Lumbar Assessment Lumbar Assessment: Within Functional Limits Postural Control Postural Control: Within Functional Limits  Balance Balance Balance Assessed: Yes Static Sitting Balance Static Sitting - Balance Support: No upper extremity supported;Feet supported Static Sitting - Level of Assistance: 7: Independent Dynamic Sitting Balance Dynamic Sitting - Balance Support: No upper extremity supported;Feet supported Dynamic Sitting - Level of Assistance: 7: Independent Static Standing Balance Static Standing - Balance Support: During functional activity;No upper extremity supported Static  Standing - Level of Assistance: 5: Stand by assistance Dynamic Standing Balance Dynamic Standing - Balance Support: During functional activity;Left upper extremity supported Dynamic Standing - Level of Assistance: 5: Stand by assistance Extremity Assessment  [name] Assessment [name] Assessment: Exceptions to [facility] Passive Range of Motion (PROM) Comments: [facility] General Strength Comments: pt unable to isolate movements outside of synergies, active hip flexion/extension within functional range, active knee extension within functional range, trace hamstring activation LLE Assessment LLE Assessment: Within Functional Limits    Ileen Kahre K Aleria Maheu 01/06/2019, 11:26 AM

## 2019-01-06 NOTE — Discharge Summary (Addendum)
Physician Discharge Summary  Patient ID: Walter Grimes MRN: [id number] DOB/AGE: 22-Jun-1991 28 y.o.  Admit date: 12/19/2018 Discharge date: 01/07/2019  Discharge Diagnoses:  Active Problems:   ICH (intracerebral hemorrhage) (HCC)   Seizures (HCC)   Transaminitis   Sleep disturbance   Anxiety state   Labile blood pressure   Reactive depression   Cognitive deficit, post-stroke History of alcohol and marijuana use  Discharged Condition: Stable  Significant Diagnostic Studies: Ct Head Wo Contrast  Result Date: 12/12/2018 CLINICAL DATA:  Intracranial hemorrhage, known, follow-up EXAM: CT HEAD WITHOUT CONTRAST TECHNIQUE: Contiguous axial images were obtained from the base of the skull through the vertex without intravenous contrast. COMPARISON:  CT head without contrast 12/10/2018 FINDINGS: Brain: Left posterior frontal and parietal hemorrhage is again noted. There is some contraction of the clot with surrounding edema, as expected. Hemorrhage extends across the posterior corpus callosum. No new hemorrhage is present. Intraventricular hemorrhage is evident bilaterally. There is no hydrocephalus. No new hemorrhage is present. No significant extra-axial fluid collection is present. No new hemorrhage is present. No acute infarct is present. The ventricles are of normal size. Vascular: No hyperdense vessel or unexpected calcification. Skull: Calvarium is intact. No focal lytic or blastic lesions are present. Sinuses/Orbits: A polyp or mucous retention cyst in the left maxillary sinus is stable. The paranasal sinuses and mastoid air cells are otherwise clear. IMPRESSION: 1. Continued evolution of posterior left frontal and parietal hematoma without significant expansion. There may be some contraction of the clot with developing edema. 2. Stable extension of hemorrhage into the corpus callosum. 3. Intraventricular hemorrhage without significant hydrocephalus. Electronically Signed   By: Marin Roberts M.D.   On: 12/12/2018 07:31   Ct Head Wo Contrast  Result Date: 12/11/2018 CLINICAL DATA:  Intracranial hemorrhage, follow-up EXAM: CT HEAD WITHOUT CONTRAST TECHNIQUE: Contiguous axial images were obtained from the base of the skull through the vertex without intravenous contrast. COMPARISON:  12/09/2018 FINDINGS: Brain: Left parietal hemorrhage is again noted. Surrounding vasogenic edema. No real change in the size of the bleed with evolutionary change. Extension into the lateral ventricles. Decreasing blood in the left lateral ventricle posterior horn. No hydrocephalus. Mild increase in left-to-right midline shift, now 6 mm compared to 4 mm previously. No new hemorrhage. Vascular: No hyperdense vessel or unexpected calcification. Skull: No acute calvarial abnormality. Sinuses/Orbits: No acute finding Other: None IMPRESSION: Stable size of the left parietal intracerebral hemorrhage with early evolutionary changes. Intraventricular hemorrhage appears slightly decreased with decreasing blood in the posterior horn of the left lateral ventricle. Slight increased left-to-right midline shift, now 6 mm. Electronically Signed   By: Charlett Nose M.D.   On: 12/11/2018 02:54   Ct Head Wo Contrast  Result Date: 12/09/2018 CLINICAL DATA:  Intracranial hemorrhage. Change in mental status. Patient has become less responsive. EXAM: CT HEAD WITHOUT CONTRAST TECHNIQUE: Contiguous axial images were obtained from the base of the skull through the vertex without intravenous contrast. COMPARISON:  MRI brain 12/09/2018 at 3:15 a.m. FINDINGS: Brain: Previously noted left parietal parenchymal hemorrhage demonstrates slight increase in size when measured in AP diameter on the sagittal reformatted images. Maximal dimension is now 5.8 cm, compared with 5.4 cm on the prior study. Midline shift is stable at 4 mm. Hemorrhage within the corpus callosum and ventricles is stable. There is no hydrocephalus. No new areas of hemorrhage  are present. Vascular: No hyperdense vessel or unexpected calcification. Skull: Calvarium is intact. No focal lytic or blastic lesions are present. No  significant extracranial soft tissue injury is present. Sinuses/Orbits: A polyp or mucous retention cyst is again noted posteriorly in the left maxillary sinus. The paranasal sinuses and mastoid air cells are otherwise clear. Globes demonstrate dysconjugate gaze. This may be within normal limits. Globes and orbits are otherwise within normal limits. Other: IMPRESSION: 1. Slight increase in left parietal parenchymal hemorrhage from 5.4 to 5.8 cm in AP diameter as measured on the sagittal reformatted images. 2. Hemorrhage is otherwise stable including involvement of the corpus callosum and extension into the ventricles. 3. No hydrocephalus. 4. Stable mass effect with 4 mm midline shift. 5. No new hemorrhage. The above was relayed via text pager to Dr. Caryl PinaERIC LINDZEN on 12/09/2018 at 04:47 . Electronically Signed   By: Marin Robertshristopher  Mattern M.D.   On: 12/09/2018 04:47   Ct Head Wo Contrast  Result Date: 12/09/2018 CLINICAL DATA:  Altered mental status, possible seizure. EXAM: CT HEAD WITHOUT CONTRAST TECHNIQUE: Contiguous axial images were obtained from the base of the skull through the vertex without intravenous contrast. COMPARISON:  05/05/2015 FINDINGS: Brain: There is a left parietal hemorrhage measuring 5.8 x 2.2 cm which extends to the lateral ventricle with intraventricular hemorrhage. Blood noted within the lateral ventricles, 3rd ventricle and 4th ventricle. No hydrocephalus. No midline shift. Vascular: No hyperdense vessel or unexpected calcification. Skull: No acute calvarial abnormality. Sinuses/Orbits: Visualized paranasal sinuses and mastoids clear. Orbital soft tissues unremarkable. Other: None IMPRESSION: 5.8 x 2.2 cm left parietal hemorrhage with intraventricular extension. Critical Value/emergent results were called by telephone at the time of  interpretation on 12/09/2018 at 12:22 am to Dr. Senaida OresPaulina, who verbally acknowledged these results. Electronically Signed   By: Charlett NoseKevin  Dover M.D.   On: 12/09/2018 00:27   Mr Shirlee LatchMra Head OZWo Contrast  Result Date: 12/09/2018 CLINICAL DATA:  28 y/o  M; intracranial hemorrhage for follow-up. EXAM: MRI HEAD WITHOUT CONTRAST MRA HEAD WITHOUT CONTRAST TECHNIQUE: MRI of the brain with axial DWI, sagittal T1, axial T2 blade, axial FLAIR blade sequences were acquired. MRA time-of-flight was acquired in the axial plane with multiplanar MIP reconstruction. COMPARISON:  12/09/2018 CT head FINDINGS: MRI HEAD FINDINGS Brain: Stable T1 isointense and T2 hyperintense acute hemorrhage centered within the left medial parietal lobe extending into corpus callosum measuring 58 x 21 mm (AP by ML series 15, image 20). Stable volume of intraventricular hemorrhage throughout the ventricular system, greatest in left lateral ventricle. Associated mass effect with 4 mm of left-to-right midline shift. Mildly increased T2 FLAIR signal within the quadrigeminal plate cistern and diffuse sulci predominantly over left posterior convexity his compatible subarachnoid hemorrhage, likely due to redistribution. No downward herniation at this time. Vascular: Normal flow voids. No vascular nidus or clustered flow voids identified. Skull and upper cervical spine: Normal marrow signal. Sinuses/Orbits: Left maxillary sinus mucous retention cyst. Additional included paranasal sinuses and mastoid air cells demonstrate normal signal. Orbits are normal. Other: None. MRA HEAD FINDINGS Internal carotid arteries:  Patent. Anterior cerebral arteries:  Patent. Middle cerebral arteries: Patent. Anterior communicating artery: Not identified, likely hypoplastic or absent. Posterior communicating arteries:  Patent. Posterior cerebral arteries:  Patent. Basilar artery:  Patent. Vertebral arteries:  Patent. No evidence of high-grade stenosis, large vessel occlusion, or  aneurysm. No high flow vascular lesion is identified within the field of view, however, the superior aspect of the hematoma is above the field of view. IMPRESSION: 1. Stable acute hemorrhage within left medial parietal lobe extending into corpus callosum. Stable intraventricular hemorrhage greatest in left lateral ventricle.  Trace volume of subarachnoid hemorrhage likely due to redistribution. Stable associated mass effect with 4 mm left-to-right midline shift. No clustered flow voids to indicate vascular nidus identified. 2. Otherwise unremarkable MRI of the brain. 3. Normal MRA of the head. No high flow vascular lesion within the field of view, however, the superior aspect of the hematoma is above the field of view. Electronically Signed   By: Mitzi HansenLance  Furusawa-Stratton M.D.   On: 12/09/2018 03:57   Mr Brain Wo Contrast  Result Date: 12/09/2018 CLINICAL DATA:  28 y/o  M; intracranial hemorrhage for follow-up. EXAM: MRI HEAD WITHOUT CONTRAST MRA HEAD WITHOUT CONTRAST TECHNIQUE: MRI of the brain with axial DWI, sagittal T1, axial T2 blade, axial FLAIR blade sequences were acquired. MRA time-of-flight was acquired in the axial plane with multiplanar MIP reconstruction. COMPARISON:  12/09/2018 CT head FINDINGS: MRI HEAD FINDINGS Brain: Stable T1 isointense and T2 hyperintense acute hemorrhage centered within the left medial parietal lobe extending into corpus callosum measuring 58 x 21 mm (AP by ML series 15, image 20). Stable volume of intraventricular hemorrhage throughout the ventricular system, greatest in left lateral ventricle. Associated mass effect with 4 mm of left-to-right midline shift. Mildly increased T2 FLAIR signal within the quadrigeminal plate cistern and diffuse sulci predominantly over left posterior convexity his compatible subarachnoid hemorrhage, likely due to redistribution. No downward herniation at this time. Vascular: Normal flow voids. No vascular nidus or clustered flow voids identified.  Skull and upper cervical spine: Normal marrow signal. Sinuses/Orbits: Left maxillary sinus mucous retention cyst. Additional included paranasal sinuses and mastoid air cells demonstrate normal signal. Orbits are normal. Other: None. MRA HEAD FINDINGS Internal carotid arteries:  Patent. Anterior cerebral arteries:  Patent. Middle cerebral arteries: Patent. Anterior communicating artery: Not identified, likely hypoplastic or absent. Posterior communicating arteries:  Patent. Posterior cerebral arteries:  Patent. Basilar artery:  Patent. Vertebral arteries:  Patent. No evidence of high-grade stenosis, large vessel occlusion, or aneurysm. No high flow vascular lesion is identified within the field of view, however, the superior aspect of the hematoma is above the field of view. IMPRESSION: 1. Stable acute hemorrhage within left medial parietal lobe extending into corpus callosum. Stable intraventricular hemorrhage greatest in left lateral ventricle. Trace volume of subarachnoid hemorrhage likely due to redistribution. Stable associated mass effect with 4 mm left-to-right midline shift. No clustered flow voids to indicate vascular nidus identified. 2. Otherwise unremarkable MRI of the brain. 3. Normal MRA of the head. No high flow vascular lesion within the field of view, however, the superior aspect of the hematoma is above the field of view. Electronically Signed   By: Mitzi HansenLance  Furusawa-Stratton M.D.   On: 12/09/2018 03:57   Mr Brain W Contrast  Result Date: 12/16/2018 CLINICAL DATA:  Intracranial hemorrhage follow up EXAM: MRI HEAD WITH CONTRAST TECHNIQUE: Multiplanar, multiecho pulse sequences of the brain and surrounding structures were obtained with intravenous contrast. CONTRAST:  7.5 mL Gadavist COMPARISON:  Head CT 12/12/2018 Brain MRI without contrast 12/09/2018 FINDINGS: Mildly motion degraded study. There is unchanged intraparenchymal hematoma within the left parietal lobe with intraventricular extension  and involvement of the corpus callosum. There is intrinsic T1WI-hyperintensity surrounding the hemorrhage. There is no abnormal contrast enhancement. Small amount of left convexity subarachnoid blood, unchanged. Unchanged size and configuration of the ventricles. IMPRESSION: No abnormal contrast enhancement. Unchanged left parietal hemorrhage extending into the ventricles, corpus callosum and left convexity subarachnoid space. Electronically Signed   By: Deatra RobinsonKevin  Herman M.D.   On: 12/16/2018 20:22  Dg Chest Port 1 View  Result Date: 12/09/2018 CLINICAL DATA:  29 year old male with a history of respiratory failure EXAM: PORTABLE CHEST 1 VIEW COMPARISON:  07/21/2018 FINDINGS: The heart size and mediastinal contours are within normal limits. Both lungs are clear. The visualized skeletal structures are unremarkable. IMPRESSION: Negative for acute cardiopulmonary disease. Electronically Signed   By: Corrie Mckusick D.O.   On: 12/09/2018 13:21    Labs:  Basic Metabolic Panel: Recent Labs  Lab 01/02/19 0559 01/05/19 1134  NA 137 139  K 3.6 4.1  CL 102 102  CO2 26 29  GLUCOSE 89 98  BUN 12 10  CREATININE 0.80 1.06  CALCIUM 9.2 9.8    CBC: No results for input(s): WBC, NEUTROABS, HGB, HCT, MCV, PLT in the last 168 hours.  CBG: No results for input(s): GLUCAP in the last 168 hours.  Family history.  Mother and father with hypertension.  Denies any diabetes or colon cancer  Brief HPI:   Walter Grimes is a 28 year old right-handed male with documented history of alcohol tobacco use on no prescription medication.  Lives with girlfriend of 1 month independent prior to admission.  Works as a Radiation protection practitioner.  Plans to stay with his mother and stepfather on discharge.  Presented 12/08/2018 with altered mental status unresponsiveness and reported seizure by EMS lasting approximately 3 minutes.  Urine drug screen positive marijuana.  Cranial CT scan and MRI showed a 5.8 x 2.8 cm left parietal hemorrhage  with intraventricular extension with 4 mm left to right midline shift.  Normal MRA of the head.  Initial EEG negative for seizure.  Follow-up EEG 12/12/2018 and 12/14/2018 left focal slowing with few electrographic seizures.  Patient loaded with Keppra later changed to valproate 750 mg every 8 hours.  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  Follow-up MRI 12/16/2018 unchanged no enhancing lesions or tumor.  Patient initially maintained on 3% for cerebral edema since resolved and discontinued.  Echocardiogram with ejection fraction of 65%.  Therapy evaluations completed and patient was admitted for a comprehensive rehab program  Hospital Course: Walter Grimes was admitted to rehab 12/19/2018 for inpatient therapies to consist of PT, ST and OT at least three hours five days a week. Past admission physiatrist, therapy team and rehab RN have worked together to provide customized collaborative inpatient rehab.  Pertaining to patient left parietal frontal ICH with IVH.  He would follow-up neurology services.  Latest imaging stable.  Subcutaneous heparin initiated for DVT prophylaxis 12/12/2018 no bleeding episodes.  Pain management with headaches improved with the use of low-dose baclofen as well as Fioricet.  Mood stabilization with Xanax.  He was attending full therapies with occasional encouragement needed.  Seizure disorder valproate 750 mg every 8 hours level within normal limits on 12/29/2018.  Noted history of alcohol tobacco marijuana use patient received full counts regards to cessation of these products it was questionable if he would be cooperative with abstaining.  Physical exam.  Blood pressure 115/75 pulse 69 temperature 98.1 respirations 18 oxygen saturations 99% room air Constitutional.  Well-developed well-nourished HEENT Head.  Normocephalic and atraumatic Eyes.  Pupils round and reactive to light no nystagmus Neck was supple nontender normal range of motion  no tracheal deviation no thyromegaly Cardiovascular normal rate and rhythm without friction rub or murmur heard Respiratory.  Effort normal no respiratory distress without wheezes GI.  Soft exhibits no distention no abdominal tenderness positive bowel sounds without rebound Neurological.  Patient  was a bit restless some inattention to the right.  Language appeared to be intact right upper extremity trace deltoid biceps triceps 2 out of 5 grip right lower extremity 0 out of 5 trace hip extension knee extension and 0 out of 5 ankle dorsi plantarflexion left upper and left lower extremity grossly graded 4-5 out of 5.  Rehab course: During patient's stay in rehab weekly team conferences were held to monitor patient's progress, set goals and discuss barriers to discharge. At admission, patient required minimum to moderate assist for sit to stand, max assist squat pivot transfers, moderate assist supine to sit.  Minimal assist upper body bathing +2 physical assist lower body bathing +2 physical assist toilet transfers  He  has had improvement in activity tolerance, balance, postural control as well as ability to compensate for deficits. He has had improvement in functional use RUE/LUE  and [name]/LLE as well as improvement in awareness.  Patient can stand at sink wash hands transferred to wheelchair close supervision.  Patient could propel his wheelchair to the day room modified independent perform stand pivot transfers close supervision.  Ambulating 70 feet x 2 rolling walker moderate cues.  ADLs treatment sessions focused on modified bathing and dressing.  Ambulates into the bathroom rolling walker close supervision contact-guard assist.  Patient sit edge of tub bench to doff clothing with supervision.  He was fitted with a right AFO.  Full family teaching was completed plan discharge to home       Disposition: Discharge to home   Diet: Regular  Special Instructions: No smoking driving or alcohol or  illicit drug products  Medications at discharge. 1.  Tylenol as needed 2.  Xanax 0.25 mg 3 times daily as needed 3.  Baclofen 5 mg 3 times daily as needed muscle spasms 4.  Fioricet 1 tablet every 8 hours as needed headache 5.  Trazodone 50 mg nightly 6.  Valproic acid 750 mg p.o. 3 times daily  Discharge Instructions    Ambulatory referral to Neurology   Complete by: As directed    An appointment is requested in approximately: 4 to 6 weeks ICH   Ambulatory referral to Physical Medicine Rehab   Complete by: As directed    Moderate complexity follow-up 2 to 4 weeks weeks ICH        Signed: Mcarthur RossettiDaniel J Angiulli 01/06/2019, 5:20 AM Patient seen and examined by me on day of discharge. Maryla MorrowAnkit Maddy Graham, MD, ABPMR

## 2019-01-06 NOTE — Plan of Care (Signed)
  Problem: RH Balance Goal: LTG: Patient will maintain dynamic sitting balance (OT) Description: LTG:  Patient will maintain dynamic sitting balance with assistance during activities of daily living (OT) Outcome: Completed/Met Goal: LTG Patient will maintain dynamic standing with ADLs (OT) Description: LTG:  Patient will maintain dynamic standing balance with assist during activities of daily living (OT)  Outcome: Completed/Met   Problem: Sit to Stand Goal: LTG:  Patient will perform sit to stand in prep for activites of daily living with assistance level (OT) Description: LTG:  Patient will perform sit to stand in prep for activites of daily living with assistance level (OT) Outcome: Completed/Met   Problem: RH Grooming Goal: LTG Patient will perform grooming w/assist,cues/equip (OT) Description: LTG: Patient will perform grooming with assist, with/without cues using equipment (OT) Outcome: Completed/Met   Problem: RH Dressing Goal: LTG Patient will perform upper body dressing (OT) Description: LTG Patient will perform upper body dressing with assist, with/without cues (OT). Outcome: Completed/Met Goal: LTG Patient will perform lower body dressing w/assist (OT) Description: LTG: Patient will perform lower body dressing with assist, with/without cues in positioning using equipment (OT) Outcome: Completed/Met   Problem: RH Toileting Goal: LTG Patient will perform toileting task (3/3 steps) with assistance level (OT) Description: LTG: Patient will perform toileting task (3/3 steps) with assistance level (OT)  Outcome: Completed/Met   Problem: RH Functional Use of Upper Extremity Goal: LTG Patient will use RT/LT upper extremity as a (OT) Description: LTG: Patient will use right/left upper extremity as a stabilizer/gross assist/diminished/nondominant/dominant level with assist, with/without cues during functional activity (OT) Outcome: Completed/Met   Problem: RH Toilet  Transfers Goal: LTG Patient will perform toilet transfers w/assist (OT) Description: LTG: Patient will perform toilet transfers with assist, with/without cues using equipment (OT) Outcome: Completed/Met   Problem: RH Tub/Shower Transfers Goal: LTG Patient will perform tub/shower transfers w/assist (OT) Description: LTG: Patient will perform tub/shower transfers with assist, with/without cues using equipment (OT) Outcome: Completed/Met   Problem: RH Memory Goal: LTG Patient will demonstrate ability for day to day recall/carry over during activities of daily living with assistance level (OT) Description: LTG:  Patient will demonstrate ability for day to day recall/carry over during activities of daily living with assistance level (OT). Outcome: Completed/Met   Problem: RH Awareness Goal: LTG: Patient will demonstrate awareness during functional activites type of (OT) Description: LTG: Patient will demonstrate awareness during functional activites type of (OT) Outcome: Completed/Met

## 2019-01-06 NOTE — Progress Notes (Signed)
  Patient ID: Walter Grimes, male   DOB: [birth year], 28 y.o.   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  A walker will not resolve issue with performing activities of daily living.  A wheelchair will allow patient to safely perform daily activities.  Patient is not able to propel themselves in the home using a standard weight wheelchair due to hemiplegia and weakness.  Patient can self propel in the lightweight wheelchair.  Lauraine Rinne, PA-C

## 2019-01-06 NOTE — Progress Notes (Signed)
[place] PHYSICAL MEDICINE & REHABILITATION PROGRESS NOTE   Subjective/Complaints: Patient seen laying in bed this morning.  He states he slept fairly overnight.  He notes intermittent spasms.  ROS: + Intermittent spasms.  Denies CP, SOB, N/V/D  Objective:   No results found. No results for input(s): WBC, HGB, HCT, PLT in the last 72 hours. Recent Labs    01/05/19 1134  NA 139  K 4.1  CL 102  CO2 29  GLUCOSE 98  BUN 10  CREATININE 1.06  CALCIUM 9.8    Intake/Output Summary (Last 24 hours) at 01/06/2019 1027 Last data filed at 01/06/2019 0746 Gross per 24 hour  Intake 358 ml  Output -  Net 358 ml     Physical Exam: Vital Signs Blood pressure 117/78, pulse 88, temperature 98.9 F (37.2 C), temperature source Oral, resp. rate 18, height 5\' 8"  (1.727 m), weight 74.5 kg, SpO2 100 %.  Constitutional: NAD. Vital signs reviewed.  HENT: Normocephalic.  Atraumatic. Eyes: EOMI.  No discharge. Cardiovascular: No JVD. Respiratory: Normal effort. GI: Non-distended. Musc: No edema or tenderness in extremities. Neurological: Alert Motor: RUE: 4-/5 shoulder abduction, 4--4/5 elbow flexion and handgrip, 4-/5 elbow extension, wrist extension, stable [name]: 3+/5 hip flexion, knee extension, 2-/5 ankle dorsiflexion with apraxia, improving Skin: Numerous tattoos. Psychiatric: Flat, improving  Assessment/Plan: 1. 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  Physiatrist is providing close team supervision and 24 hour management of active medical problems listed below.  Physiatrist and rehab team continue to assess barriers to discharge/monitor patient progress toward functional and medical goals  Care Tool:  Bathing    Body parts bathed by patient: Right arm, Chest, Abdomen, Right upper leg, Left upper leg, Left lower leg, Face, Left arm, Front perineal area,  Right lower leg, Buttocks   Body parts bathed by helper: Front perineal area, Buttocks, Left arm, Right lower leg     Bathing assist Assist Level: Supervision/Verbal cueing     Upper Body Dressing/Undressing Upper body dressing   What is the patient wearing?: Pull over shirt    Upper body assist Assist Level: Supervision/Verbal cueing    Lower Body Dressing/Undressing Lower body dressing      What is the patient wearing?: Underwear/pull up     Lower body assist Assist for lower body dressing: Supervision/Verbal cueing     Toileting Toileting    Toileting assist Assist for toileting: Supervision/Verbal cueing Assistive Device Comment: urinal   Transfers Chair/bed transfer  Transfers assist     Chair/bed transfer assist level: Supervision/Verbal cueing     Locomotion Ambulation   Ambulation assist      Assist level: Supervision/Verbal cueing Assistive device: Walker-rolling Max distance: 161ft   Walk 10 feet activity   Assist  Walk 10 feet activity did not occur: Safety/medical concerns  Assist level: Supervision/Verbal cueing Assistive device: Walker-rolling   Walk 50 feet activity   Assist Walk 50 feet with 2 turns activity did not occur: Safety/medical concerns  Assist level: Supervision/Verbal cueing Assistive device: Walker-rolling    Walk 150 feet activity   Assist Walk 150 feet activity did not occur: Safety/medical concerns  Assist level: Supervision/Verbal cueing Assistive device: Walker-rolling    Walk 10 feet on uneven surface  activity   Assist Walk 10 feet on uneven surfaces activity did not occur: Safety/medical concerns         Wheelchair     Assist Will  patient use wheelchair at discharge?: Yes Type of Wheelchair: Manual Wheelchair activity did not occur: Safety/medical concerns(unable due to pt fatigue)  Wheelchair assist level: Independent Max wheelchair distance: 12750ft    Wheelchair 50 feet with 2  turns activity    Assist        Assist Level: Independent   Wheelchair 150 feet activity     Assist     Assist Level: Independent     Medical Problem List and Plan: 1.Cognitive deficits with decreased functional mobilitysecondary toleft parietal frontal ICH with IVH  Continue CIR  [name] [name]  Plan for d/c tomorrow  Will see patient for transitional care management in 1-2 weeks post-discharge 2. Antithrombotics: -DVT/anticoagulation:Subcutaneous heparin initiated 12/12/2018 -antiplatelet therapy: N/A 3. Pain Management: Tramadol DC'd  Tylenol scheduled-improvement in headaches, changed to PRN on 7/14  Low dose baclofen started on 7/1, changed to PRN on 7/2  Fioricet as needed for headache  Controlled on 7/14 4. Mood:Xanax 0.25 mg 3 times daily for anxiety, relatively controlled on 7/13  Continues to be depressed, but does not want medications, stable  Needs consistent reassurance  Educate on coping mechanisms  Appreciate neuropsych eval, notes reviewed antipsychotic agents: N/A 5. Neuropsych: This patient ?Fullycapable of making decisions on hisown behalf. 6. Skin/Wound Care:Routine skin checks 7. Fluids/Electrolytes/Nutrition:encourage PO  BMP within normal limits on 7/10 8. Seizure disorder. Valproate 750 mg every 8 hours.   Labile within normal limits on 7/6, repeat levels ordered for tomorrow  No episodes since admission to rehab 9. History of alcohol tobacco and marijuana use. Provide counseling as appropriate 10.  Sleep disturbance, predominantly due to psychosocial stressors  Trazodone nightly  Improving overall 11.  Transaminitis  LFTs elevated, but improving on 7/13, tylenol changed to PRN  Continue to monitor 12.  Labile blood pressure  Controlled on 7/14  LOS: 18 days A FACE TO FACE EVALUATION WAS PERFORMED  Nannie Starzyk Karis Jubanil Burwell [place] 01/06/2019, 10:27 AM

## 2019-01-06 NOTE — Progress Notes (Signed)
Physical Therapy Session Note  Patient Details  Name: JAMALL STROHMEIER MRN: [id number] Date of Birth: 09-20-1990  Today's Date: 01/06/2019 PT Individual Time: 0903-1003 PT Individual Time Calculation (min): 60 min   Short Term Goals: Week 3:  PT Short Term Goal 1 (Week 3): =LTGs due to ELOS  Skilled Therapeutic Interventions/Progress Updates: Pt presented in bed agreeable to therapy. Pt stating ms spasm during night causing poor sleep. Pt performed bed mobility mod I and donned shoes/AFO with set up with increased time. Performed stand pivot with RW to w/c supervision. Pt propelled to ortho gym and participated in car transfer supervision assist. Propelled to day room and participated in gait training with RW and supervision A with intermittent biofeedback to decrease R knee hyperextension. Returned to rehab gym and participated in stair training x 12 steps L rail with CGA and vc's for safety. Pt also performed x 8 steps 3in height leading with [name] for strengthening. Pt returned to room at end of session and requesting to use BR. Pt performed ambulatory transfer to bathroom and pt left transferring to toilet with pt agreeing to use call button once completed and nsg aware of pt's status.     Therapy Documentation Precautions:  Precautions Precautions: None Restrictions Weight Bearing Restrictions: No    Therapy/Group: Individual Therapy  Kayvion Arneson  Jona Zappone, PTA  01/06/2019, 12:17 PM

## 2019-01-06 NOTE — Progress Notes (Signed)
Occupational Therapy Discharge Summary  Patient Details  Name: Walter Grimes MRN: [id number] Date of Birth: [birth year]  Today's Date: 01/06/2019 OT Individual Time: 1779-3903 OT Individual Time Calculation (min): 72 min    Pt greeted semi-reclined in bed and agreeable to OT treatment session. Stand-pivot to wc with supervision. Pt propelled wc to therapy gym and transferred onto therapy mat supervision. Standing balance and UB strengthening with standing bicep curls, straight arm raises, and upright rows using 2 lb dowel rod, 3 sets of 10. Pt propelled wc back to room for BADL tasks. Pt ambulated into bathroom w/ RW to transfer onto tub bench with close supervision. Bathing/dressing completed with overall close supervision and increased time. Pt left seated in wc with chair alarm on and needs met.  Patient has met 12 of 12 long term goals due to improved activity tolerance, improved balance, postural control, ability to compensate for deficits, functional use of  RIGHT upper and RIGHT lower extremity, improved attention, improved awareness and improved coordination.  Patient to discharge at overall Supervision level.  Patient's care partner is independent to provide the necessary physical assistance at discharge.    Reasons goals not met: n/a  Recommendation:  Patient will benefit from ongoing skilled OT services in outpatient setting to continue to advance functional skills in the area of BADL and functional use of R UE.  Equipment: tub transfer bench, 3-in-1 BSC  Reasons for discharge: treatment goals met and discharge from hospital  Patient/family agrees with progress made and goals achieved: Yes  OT Discharge Precautions/Restrictions  Precautions Precautions: None Restrictions Weight Bearing Restrictions: No Pain  denies pain ADL ADL Eating: Independent Where Assessed-Eating: Wheelchair Grooming: Independent Where Assessed-Grooming: Edge of bed Upper Body Bathing:  Supervision/safety Where Assessed-Upper Body Bathing: Edge of bed Lower Body Bathing: Supervision/safety Where Assessed-Lower Body Bathing: Edge of bed Upper Body Dressing: Independent Where Assessed-Upper Body Dressing: Edge of bed Lower Body Dressing: Supervision/safety Where Assessed-Lower Body Dressing: Edge of bed Toileting: Supervision/safety Where Assessed-Toileting: Bedside Commode Toilet Transfer: Close supervision Toilet Transfer Method: Arts development officer: Radiographer, therapeutic: Close supervison Vision Baseline Vision/History: No visual deficits Patient Visual Report: Blurring of vision(continues to report that vision is blurry w/ certain medications, unable to state which ones, no blurriness at moment of assessment) Vision Assessment?: Yes Eye Alignment: Within Functional Limits Ocular Range of Motion: Within Functional Limits Alignment/Gaze Preference: Within Defined Limits Tracking/Visual Pursuits: Able to track stimulus in all quads without difficulty Saccades: Within functional limits Convergence: Within functional limits Perception  Perception: Within Functional Limits Praxis Praxis: Intact Cognition Overall Cognitive Status: Within Functional Limits for tasks assessed Arousal/Alertness: Awake/alert Orientation Level: Oriented X4 Focused Attention: Appears intact Sustained Attention: Appears intact Selective Attention: Appears intact Memory: Impaired Memory Impairment: Decreased recall of new information(pt states this is his baseline) Awareness: Appears intact Problem Solving: Appears intact Reasoning: Appears intact Organizing: Appears intact Behaviors: Poor frustration tolerance(appears to be baseline) Safety/Judgment: Appears intact Sensation Sensation Light Touch: Impaired Detail Light Touch Impaired Details: Impaired [name];Impaired RUE Proprioception: Impaired Detail Proprioception Impaired Details: Impaired  [name] Additional Comments: Proprioception and sensation improved since eval-still has minor deficits Coordination Gross Motor Movements are Fluid and Coordinated: No Fine Motor Movements are Fluid and Coordinated: No Coordination and Movement Description: R hemi affecting coordination, much improved since eval Heel Shin Test: Unable to perform 2/2 weakness on R side Motor  Motor Motor: Hemiplegia Motor - Discharge Observations: R Hemiplegia R LE>RUE Mobility  Transfers Sit to Stand: Supervision/Verbal  cueing Stand to Sit: Supervision/Verbal cueing  Balance Static Sitting Balance Static Sitting - Balance Support: No upper extremity supported;Feet supported Static Sitting - Level of Assistance: 7: Independent Dynamic Sitting Balance Dynamic Sitting - Balance Support: No upper extremity supported;Feet supported Dynamic Sitting - Level of Assistance: 7: Independent Static Standing Balance Static Standing - Balance Support: During functional activity;No upper extremity supported Static Standing - Level of Assistance: 5: Stand by assistance Dynamic Standing Balance Dynamic Standing - Balance Support: During functional activity Dynamic Standing - Level of Assistance: 5: Stand by assistance Extremity/Trunk Assessment RUE Assessment RUE Assessment: Exceptions to [facility] Active Range of Motion (AROM) Comments: WFL General Strength Comments: Continues to have some proximal weakness, shoulder/elbow 4/5 RUE Body System: Neuro Brunstrum levels for arm and hand: Arm;Hand Brunstrum level for arm: Stage V Relative Independence from Synergy Brunstrum level for hand: Stage VI Isolated joint movements LUE Assessment LUE Assessment: Within Functional Limits   Daneen Schick Haruki Arnold 01/06/2019, 2:19 PM

## 2019-01-07 ENCOUNTER — Telehealth (INDEPENDENT_AMBULATORY_CARE_PROVIDER_SITE_OTHER): Payer: Self-pay

## 2019-01-07 NOTE — Discharge Instructions (Signed)
Inpatient Rehab Discharge Instructions  Walter Grimes Discharge date and time: No discharge date for patient encounter.   Activities/Precautions/ Functional Status: Activity: activity as tolerated Diet: regular diet Wound Care: none needed Functional status:  ___ No restrictions     ___ Walk up steps independently ___ 24/7 supervision/assistance   ___ Walk up steps with assistance ___ Intermittent supervision/assistance  ___ Bathe/dress independently ___ Walk with walker     _x__ Bathe/dress with assistance ___ Walk Independently    ___ Shower independently ___ Walk with assistance    ___ Shower with assistance ___ No alcohol     ___ Return to work/school ________    COMMUNITY REFERRALS UPON DISCHARGE:    Home Health:   PT     OT                      Agency:  [facility]   Phone: [phone]   Medical Equipment/Items Ordered: wheelchair, cushion, walker, commode and tub bench                                                     Agency/Supplier:  [facility] @ [phone]       Special Instructions:  No smoking driving alcohol or illicit drug products  My questions have been answered and I understand these instructions. I will adhere to these goals and the provided educational materials after my discharge from the hospital.  Patient/Caregiver Signature _______________________________ Date __________  Clinician Signature _______________________________________ Date __________  Please bring this form and your medication list with you to all your follow-up doctor's appointments.

## 2019-01-07 NOTE — Telephone Encounter (Signed)
Call received from [facility], [place] requesting a hospital follow up appointment for the patient.  Informed her that an appointment has been scheduled for 01/14/2019 @ 1430 @ [facility].  She said that she would notify the patient.

## 2019-01-07 NOTE — Progress Notes (Signed)
Patient discharged home to family, all belongings sent with patient. No s/s of distress. No complications noted at this time.  Audie Clear, LPN

## 2019-01-08 NOTE — Progress Notes (Signed)
Social Work Discharge Note   The overall goal for the admission was met for:   Discharge location: Yes  Length of Stay: Yes  Discharge activity level: Yes  Home/community participation: Yes  Services provided included: MD, RD, PT, OT, SLP, RN, TR, Pharmacy, Neuropsych and SW  Financial Services: Medicaid and SSD applications begun and pending  Follow-up services arranged: Home Health: PT, OT via [facility], DME: 16x16 lightweight w/c, cushion, walker, 3n1 commode and tub bench via [facility] and Patient/Family has no preference for HH/DME agencies  Comments (or additional information):           Contact info:  Pt @ [phone]      Girlfriend, Roe Rutherford @ [phone]  Patient/Family verbalized understanding of follow-up arrangements: Yes  Individual responsible for coordination of the follow-up plan: pt  Confirmed correct DME delivered: Rakim Moone 01/08/2019    Camylle Whicker

## 2019-01-14 ENCOUNTER — Other Ambulatory Visit: Payer: Self-pay

## 2019-01-14 ENCOUNTER — Telehealth (INDEPENDENT_AMBULATORY_CARE_PROVIDER_SITE_OTHER): Payer: Self-pay | Admitting: Primary Care

## 2019-01-14 DIAGNOSIS — R569 Unspecified convulsions: Secondary | ICD-10-CM

## 2019-01-14 DIAGNOSIS — I619 Nontraumatic intracerebral hemorrhage, unspecified: Secondary | ICD-10-CM

## 2019-01-14 DIAGNOSIS — I69319 Unspecified symptoms and signs involving cognitive functions following cerebral infarction: Secondary | ICD-10-CM

## 2019-01-14 DIAGNOSIS — F329 Major depressive disorder, single episode, unspecified: Secondary | ICD-10-CM

## 2019-01-14 DIAGNOSIS — Z7689 Persons encountering health services in other specified circumstances: Secondary | ICD-10-CM

## 2019-01-14 NOTE — Progress Notes (Signed)
Virtual Visit via Telephone Note  I connected with Walter Grimes on 01/14/19 at  2:30 PM EDT by telephone and verified that I am speaking with the correct person using two identifiers.   I discussed the limitations, risks, security and privacy concerns of performing an evaluation and management service by telephone and the availability of in person appointments. I also discussed with the patient that there may be a patient responsible charge related to this service. The patient expressed understanding and agreed to proceed.   History of Present Illness: Walter Grimes is establishing care after a hospitalization.  He presented to the emergency room with altered mental status he woke up from his sleep with complaints of chest pain and feeling as though he may pass out, then started vomiting.  While his girlfriend was taking him to the emergency room he became unresponsive and possibly seizures.  No history indicating seizures as a medical problem .  No past medical history on file.   Observations/Objective: Review of Systems  Constitutional: Positive for malaise/fatigue.  Musculoskeletal: Positive for falls.       Right side weakness and falls  Neurological: Positive for seizures and weakness.  Psychiatric/Behavioral: Positive for depression.  All other systems reviewed and are negative.   Assessment and Plan: Diagnoses and all orders for this visit:  Encounter to establish care Patient had no known medical history until presenting to the emergency room with chest pain and unresponsiveness he was admitted and evaluated treated and transferred to rehab.  Establishing primary care.  Seizures ([place]) New diagnosis will refer to neurology presently medication is valproic acid 750 mg every 8 hours.  Left-sided nontraumatic intracerebral hemorrhage, unspecified cerebral location [facility]) Cranial CT/MRI scan showed a 5.8 x 2.8 cm left parietal hemorrhage with intraventricular extension with 4  mm left to right midline shift.  Initial EEG is negative for seizures follow-up EEG December 12, 2018 and December 14, 2018 left focal slowing with few electrographic seizures patient was seen by neurology and was started on medication for seizures.   Cognitive deficit, post-stroke Patient admits to memory loss and at times unable to find the words to indicate what he is trying to say.  Patient was admitted to physical medicine and rehab.  No neurological deficit prior to parietal hemorrhage with intraventricular extension.  Reactive depression    Follow Up Instructions:    I discussed the assessment and treatment plan with the patient. The patient was provided an opportunity to ask questions and all were answered. The patient agreed with the plan and demonstrated an understanding of the instructions.   The patient was advised to call back or seek an in-person evaluation if the symptoms worsen or if the condition fails to improve as anticipated.  I provided 35 minutes of non-face-to-face time during this encounter.  This included new patient encounter review of hospital records, labs, imaging and procedures   Kerin Perna, NP

## 2019-01-16 ENCOUNTER — Telehealth: Payer: Self-pay | Admitting: Physical Medicine & Rehabilitation

## 2019-01-16 NOTE — Telephone Encounter (Addendum)
Dr Posey Pronto is off so I have sent a message to [place]. I have left a message on Kate's VM giving her the ok for the POC visits.

## 2019-01-16 NOTE — Telephone Encounter (Signed)
[place] with [facility] needs to get verbal orders to see patient 1w1 and then 2w4.  She also needs to alert [place] of a severe allergic reaction to valproic acid and butabital.  Patient has not been taking the valproic acid.  Please call her at [phone].

## 2019-01-22 ENCOUNTER — Telehealth: Payer: Self-pay

## 2019-01-22 NOTE — Telephone Encounter (Signed)
Thank you.  He has a history of noncompliance and I have tried to educate him in the past.

## 2019-01-22 NOTE — Telephone Encounter (Signed)
[place] OT [facility] called requesting verbal orders for 1xwk X 4wks, called her back and approved verbal orders.  In addition she stated that while on visit noticed that he is NOT taken ANY of his discharge medication. He is scheduled for a hospital follow up visit on 01-26-2019.

## 2019-01-23 ENCOUNTER — Telehealth: Payer: Self-pay

## 2019-01-23 DIAGNOSIS — F17290 Nicotine dependence, other tobacco product, uncomplicated: Secondary | ICD-10-CM

## 2019-01-23 DIAGNOSIS — Z9181 History of falling: Secondary | ICD-10-CM

## 2019-01-23 DIAGNOSIS — F329 Major depressive disorder, single episode, unspecified: Secondary | ICD-10-CM

## 2019-01-23 DIAGNOSIS — G8194 Hemiplegia, unspecified affecting left nondominant side: Secondary | ICD-10-CM

## 2019-01-23 DIAGNOSIS — Z8673 Personal history of transient ischemic attack (TIA), and cerebral infarction without residual deficits: Secondary | ICD-10-CM

## 2019-01-23 DIAGNOSIS — F419 Anxiety disorder, unspecified: Secondary | ICD-10-CM

## 2019-01-23 DIAGNOSIS — G40909 Epilepsy, unspecified, not intractable, without status epilepticus: Secondary | ICD-10-CM

## 2019-01-23 NOTE — Telephone Encounter (Signed)
[place] PT [facility] [phone] called and left message stating patient missed al appointments this week.

## 2019-01-26 ENCOUNTER — Encounter: Payer: Self-pay | Attending: Physical Medicine & Rehabilitation | Admitting: Physical Medicine & Rehabilitation

## 2019-02-13 ENCOUNTER — Telehealth: Payer: Self-pay | Admitting: *Deleted

## 2019-02-13 DIAGNOSIS — I69319 Unspecified symptoms and signs involving cognitive functions following cerebral infarction: Secondary | ICD-10-CM

## 2019-02-13 DIAGNOSIS — I619 Nontraumatic intracerebral hemorrhage, unspecified: Secondary | ICD-10-CM

## 2019-02-13 NOTE — Telephone Encounter (Signed)
[place] OT [facility] called to say they have discharged and he needs referrals to outpt for PT/OT. I have placed the referrals.

## 2019-02-17 ENCOUNTER — Encounter: Payer: Self-pay | Admitting: Adult Health

## 2019-02-17 ENCOUNTER — Inpatient Hospital Stay: Payer: MEDICAID | Admitting: Adult Health

## 2019-03-26 ENCOUNTER — Inpatient Hospital Stay: Payer: MEDICAID | Admitting: Adult Health

## 2019-03-26 ENCOUNTER — Telehealth: Payer: Self-pay

## 2019-03-26 NOTE — Progress Notes (Deleted)
[facility] [address] [place] [id number] [phone]       HOSPITAL FOLLOW UP NOTE  Walter Grimes Date of Birth:  [birth year] Medical Record Number:  [id number]   Reason for Referral:  hospital stroke follow up    CHIEF COMPLAINT:  No chief complaint on file.   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  History obtained from *** and chart review. Reviewed all radiology images and labs personally.  Mr. [name] is a 28 y.o. 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 MRI stable left medial parietal ICH extending into the corpus callosum with stable left IVH, trace SAH and stable 4 mm left-to-right shift.  MRA unremarkable.  Serial repeat CT head stable hematoma without hydrocephalus.  MRI with contrast no enhancing lesions or tumors.  2D echo with normal EF.  LDL 93.  A1c 5.3.  Central sleep apnea improving felt secondary to IVH.  Current tobacco use with smoking cessation counseling provided.  Other stroke risk factors include EtOH use, marijuana use with UDS positive THC and hypokalemia resolved.  Ongoing headaches and recommended continuation of tramadol and Fioricet.  LTM EEG left focal slowing with few electrographic seizures therefore Keppra dosage increased but EEG continued to show seizures therefore initiated Depacon 750 mg 3 times daily without additional seizure activity.  Discharged to [facility] on 12/19/2018 for ongoing therapy and discharged home on 01/07/2019 with recommendation of ongoing therapy.     ROS:   14 system review of systems performed and negative with exception of ***  PMH: No past medical history on file.  PSH: No past surgical history on file.  Social History:  Social History   Socioeconomic  History  . Marital status: Single    Spouse name: Not on file  . Number of children: Not on file  . Years of education: Not on file  . Highest education level: Not on file  Occupational History  . Not on file  Social Needs  . Financial resource strain: Not on file  . Food insecurity    Worry: Not on file    Inability: Not on file  . Transportation needs    Medical: Not on file    Non-medical: Not on file  Tobacco Use  . Smoking status: Current Every Day Smoker    Packs/day: 1.00    Types: Cigarettes  . Smokeless tobacco: Never Used  Substance and Sexual Activity  . Alcohol use: Yes  . Drug use: Yes    Types: Marijuana  . Sexual activity: Not on file  Lifestyle  . Physical activity    Days per week: Not on file    Minutes per session: Not on file  . Stress: Not on file  Relationships  . Social Musicianconnections    Talks on phone: Not on file    Gets together: Not on file    Attends religious service: Not on file    Active member of club or organization: Not on file    Attends meetings of clubs or organizations: Not on file    Relationship status: Not on file  . Intimate partner violence    Fear of current or ex partner: Not on file    Emotionally abused: Not on file  Physically abused: Not on file    Forced sexual activity: Not on file  Other Topics Concern  . Not on file  Social History Narrative  . Not on file    Family History: No family history on file.  Medications:   Current Outpatient Medications on File Prior to Visit  Medication Sig Dispense Refill  . acetaminophen (TYLENOL) 325 MG tablet Take 2 tablets (650 mg total) by mouth every 6 (six) hours as needed for mild pain.    Marland Kitchen ALPRAZolam (XANAX) 0.25 MG tablet Take 1 tablet (0.25 mg total) by mouth 3 (three) times daily as needed for anxiety. 30 tablet 0  . Baclofen 5 MG TABS Take 5 mg by mouth 3 (three) times daily as needed for muscle spasms. 30 tablet 0  . butalbital-acetaminophen-caffeine (FIORICET)  50-325-40 MG tablet Take 1 tablet by mouth every 8 (eight) hours as needed for headache. 14 tablet 0  . traZODone (DESYREL) 50 MG tablet Take 1 tablet (50 mg total) by mouth at bedtime. 20 tablet 0  . valproic acid (DEPAKENE) 250 MG capsule Take 3 capsules (750 mg total) by mouth 3 (three) times daily. 270 capsule 0   No current facility-administered medications on file prior to visit.     Allergies:  No Known Allergies   Physical Exam  There were no vitals filed for this visit. There is no height or weight on file to calculate BMI. No exam data present  No flowsheet data found.   General: well developed, well nourished, seated, in no evident distress Head: head normocephalic and atraumatic.   Neck: supple with no carotid or supraclavicular bruits Cardiovascular: regular rate and rhythm, no murmurs Musculoskeletal: no deformity Skin:  no rash/petichiae Vascular:  Normal pulses all extremities   Neurologic Exam Mental Status: Awake and fully alert. Oriented to place and time. Recent and remote memory intact. Attention span, concentration and fund of knowledge appropriate. Mood and affect appropriate.  Cranial Nerves: Fundoscopic exam reveals sharp disc margins. Pupils equal, briskly reactive to light. Extraocular movements full without nystagmus. Visual fields full to confrontation. Hearing intact. Facial sensation intact. Face, tongue, palate moves normally and symmetrically.  Motor: Normal bulk and tone. Normal strength in all tested extremity muscles. Sensory.: intact to touch , pinprick , position and vibratory sensation.  Coordination: Rapid alternating movements normal in all extremities. Finger-to-nose and heel-to-shin performed accurately bilaterally. Gait and Station: Arises from chair without difficulty. Stance is normal. Gait demonstrates normal stride length and balance Reflexes: 1+ and symmetric. Toes downgoing.     NIHSS  *** Modified Rankin  *** CHA2DS2-VASc ***  HAS-BLED ***   Diagnostic Data (Labs, Imaging, Testing)  CT HEAD WO CONTRAST 12/09/2018 IMPRESSION: 5.8 x 2.2 cm left parietal hemorrhage with intraventricular extension.  CT ANGIO HEAD W OR WO CONTRAST CT ANGIO NECK W OR WO CONTRAST ***  MR BRAIN WO CONTRAST MR ANGIO HEAD WO CONTRAST 12/09/2018 IMPRESSION: 1. Stable acute hemorrhage within left medial parietal lobe extending into corpus callosum. Stable intraventricular hemorrhage greatest in left lateral ventricle. Trace volume of subarachnoid hemorrhage likely due to redistribution. Stable associated mass effect with 4 mm left-to-right midline shift. No clustered flow voids to indicate vascular nidus identified. 2. Otherwise unremarkable MRI of the brain. 3. Normal MRA of the head. No high flow vascular lesion within the field of view, however, the superior aspect of the hematoma is above the field of view.  CT HEAD WO CONTRAST 12/10/2018 IMPRESSION: Stable size of the left parietal intracerebral  hemorrhage with early evolutionary changes. Intraventricular hemorrhage appears slightly decreased with decreasing blood in the posterior horn of the left lateral ventricle. Slight increased left-to-right midline shift, now 6 mm.  CT HEAD WO CONTRAST 12/12/2018 IMPRESSION: 1. Continued evolution of posterior left frontal and parietal hematoma without significant expansion. There may be some contraction of the clot with developing edema. 2. Stable extension of hemorrhage into the corpus callosum. 3. Intraventricular hemorrhage without significant hydrocephalus.  MR BRAIN W CONTRAST 12/16/2018 IMPRESSION: No abnormal contrast enhancement. Unchanged left parietal hemorrhage extending into the ventricles, corpus callosum and left convexity subarachnoid space.    ASSESSMENT: Walter Grimes is a 28 y.o. year old male presented with seizure activity on 12/08/2018 likely secondary to left parietal frontal ICH with IVH  secondary to unclear etiology. Vascular risk factors include tobacco use.     PLAN:  1. *** : Continue {anticoagulants:31417}  and ***  for secondary stroke prevention. Maintain strict control of hypertension with blood pressure goal below 130/90, diabetes with hemoglobin A1c goal below 6.5% and cholesterol with LDL cholesterol (bad cholesterol) goal below 70 mg/dL.  I also advised the patient to eat a healthy diet with plenty of whole grains, cereals, fruits and vegetables, exercise regularly with at least 30 minutes of continuous activity daily and maintain ideal body weight. 2. HTN: Advised to continue current treatment regimen.  Today's BP ***.  Advised to continue to monitor at home along with continued follow-up with PCP for management 3. HLD: Advised to continue current treatment regimen along with continued follow-up with PCP for future prescribing and monitoring of lipid panel 4. DMII: Advised to continue to monitor glucose levels at home along with continued follow-up with PCP for management and monitoring    Follow up in *** or call earlier if needed   Greater than 50% of time during this 45 minute visit was spent on counseling, explanation of diagnosis of ***, reviewing risk factor management of ***, planning of further management along with potential future management, and discussion with patient and family answering all questions.    Frann Rider, AGNP-BC  [facility] [address] [place], [place] [address]  Phone [phone] Fax [phone] Note: This document was prepared with digital dictation and possible smart phrase technology. Any transcriptional errors that result from this process are unintentional.

## 2019-03-26 NOTE — Telephone Encounter (Signed)
Patient was a no call/no show for their appointment today.   

## 2019-03-30 ENCOUNTER — Telehealth: Payer: Self-pay | Admitting: Adult Health

## 2019-03-30 NOTE — Telephone Encounter (Signed)
FYI- patient has missed 2 new patient appointments in 2020.

## 2019-03-30 NOTE — Telephone Encounter (Signed)
Patient no showed for 2 new patient appointments regarding stroke.  Request discharge from clinic per no-show policy.

## 2019-03-31 ENCOUNTER — Encounter: Payer: Self-pay | Admitting: Adult Health

## 2019-04-21 ENCOUNTER — Encounter: Payer: Self-pay | Attending: Physical Medicine & Rehabilitation | Admitting: Physical Medicine & Rehabilitation

## 2019-06-10 ENCOUNTER — Emergency Department (HOSPITAL_COMMUNITY)
Admission: EM | Admit: 2019-06-10 | Discharge: 2019-06-10 | Discharge status: Against Medical Advice | Payer: Medicaid Other | Attending: Emergency Medicine | Admitting: Emergency Medicine

## 2019-06-10 ENCOUNTER — Emergency Department (HOSPITAL_COMMUNITY): Payer: Medicaid Other

## 2019-06-10 ENCOUNTER — Other Ambulatory Visit: Payer: Self-pay

## 2019-06-10 ENCOUNTER — Encounter (HOSPITAL_COMMUNITY): Payer: Self-pay | Admitting: Radiology

## 2019-06-10 DIAGNOSIS — F17210 Nicotine dependence, cigarettes, uncomplicated: Secondary | ICD-10-CM | POA: Insufficient documentation

## 2019-06-10 DIAGNOSIS — R29818 Other symptoms and signs involving the nervous system: Secondary | ICD-10-CM | POA: Insufficient documentation

## 2019-06-10 DIAGNOSIS — R531 Weakness: Secondary | ICD-10-CM | POA: Insufficient documentation

## 2019-06-10 DIAGNOSIS — R2990 Unspecified symptoms and signs involving the nervous system: Secondary | ICD-10-CM

## 2019-06-10 LAB — DIFFERENTIAL
Abs Immature Granulocytes: 0.03 10*3/uL (ref 0.00–0.07)
Basophils Absolute: 0 10*3/uL (ref 0.0–0.1)
Basophils Relative: 0 %
Eosinophils Absolute: 0 10*3/uL (ref 0.0–0.5)
Eosinophils Relative: 0 %
Immature Granulocytes: 0 %
Lymphocytes Relative: 25 %
Lymphs Abs: 2.4 10*3/uL (ref 0.7–4.0)
Monocytes Absolute: 0.7 10*3/uL (ref 0.1–1.0)
Monocytes Relative: 7 %
Neutro Abs: 6.8 10*3/uL (ref 1.7–7.7)
Neutrophils Relative %: 68 %

## 2019-06-10 LAB — CBC
HCT: 43.1 % (ref 39.0–52.0)
Hemoglobin: 15 g/dL (ref 13.0–17.0)
MCH: 31.1 pg (ref 26.0–34.0)
MCHC: 34.8 g/dL (ref 30.0–36.0)
MCV: 89.2 fL (ref 80.0–100.0)
Platelets: 286 10*3/uL (ref 150–400)
RBC: 4.83 MIL/uL (ref 4.22–5.81)
RDW: 13.1 % (ref 11.5–15.5)
WBC: 10 10*3/uL (ref 4.0–10.5)
nRBC: 0 % (ref 0.0–0.2)

## 2019-06-10 LAB — COMPREHENSIVE METABOLIC PANEL
ALT: 31 U/L (ref 0–44)
AST: 28 U/L (ref 15–41)
Albumin: 3.8 g/dL (ref 3.5–5.0)
Alkaline Phosphatase: 72 U/L (ref 38–126)
Anion gap: 9 (ref 5–15)
BUN: 9 mg/dL (ref 6–20)
CO2: 24 mmol/L (ref 22–32)
Calcium: 9.1 mg/dL (ref 8.9–10.3)
Chloride: 106 mmol/L (ref 98–111)
Creatinine, Ser: 0.94 mg/dL (ref 0.61–1.24)
GFR calc Af Amer: >60 mL/min (ref >60)
GFR calc non Af Amer: >60 mL/min (ref >60)
Glucose, Bld: 101 mg/dL — ABNORMAL HIGH (ref 70–99)
Potassium: 3.5 mmol/L (ref 3.5–5.1)
Sodium: 139 mmol/L (ref 135–145)
Total Bilirubin: <0.1 mg/dL — ABNORMAL LOW (ref 0.3–1.2)
Total Protein: 6.4 g/dL — ABNORMAL LOW (ref 6.5–8.1)

## 2019-06-10 LAB — CBG MONITORING, ED: Glucose-Capillary: 100 mg/dL — ABNORMAL HIGH (ref 70–99)

## 2019-06-10 LAB — VALPROIC ACID LEVEL: Valproic Acid Lvl: <10 ug/mL — ABNORMAL LOW (ref 50.0–100.0)

## 2019-06-10 LAB — I-STAT CHEM 8, ED
BUN: 12 mg/dL (ref 6–20)
Calcium, Ion: 1.14 mmol/L — ABNORMAL LOW (ref 1.15–1.40)
Chloride: 103 mmol/L (ref 98–111)
Creatinine, Ser: 0.9 mg/dL (ref 0.61–1.24)
Glucose, Bld: 95 mg/dL (ref 70–99)
HCT: 45 % (ref 39.0–52.0)
Hemoglobin: 15.3 g/dL (ref 13.0–17.0)
Potassium: 3.3 mmol/L — ABNORMAL LOW (ref 3.5–5.1)
Sodium: 141 mmol/L (ref 135–145)
TCO2: 26 mmol/L (ref 22–32)

## 2019-06-10 LAB — ETHANOL: Alcohol, Ethyl (B): <10 mg/dL (ref <10)

## 2019-06-10 LAB — APTT: aPTT: 29 seconds (ref 24–36)

## 2019-06-10 LAB — PROTIME-INR
INR: 1 (ref 0.8–1.2)
Prothrombin Time: 13 seconds (ref 11.4–15.2)

## 2019-06-10 MED ORDER — IOHEXOL 350 MG/ML SOLN
75.0000 mL | Freq: Once | INTRAVENOUS | Status: AC | PRN
  Administered 2019-06-10: 75 mL via INTRAVENOUS

## 2019-06-10 NOTE — ED Provider Notes (Signed)
[facility] EMERGENCY DEPARTMENT Provider Note   CSN: [id number] Arrival date & time: 06/10/19  2951  An emergency department physician performed an initial assessment on this suspected stroke patient at 0020.  History Chief Complaint  Patient presents with  . Code Stroke    Walter Grimes is a 28 y.o. male.  States worsening of weakness in right leg   Neurologic Problem This is a recurrent problem. The current episode started 1 to 2 hours ago. The problem occurs constantly. Pertinent negatives include no chest pain, no headaches and no shortness of breath. Nothing aggravates the symptoms. Nothing relieves the symptoms. He has tried nothing for the symptoms.       History reviewed. No pertinent past medical history.  Patient Active Problem List   Diagnosis Date Noted  . Cognitive deficit, post-stroke   . Reactive depression   . Labile blood pressure   . Seizures (HCC)   . Transaminitis   . Sleep disturbance   . Anxiety state   . ICH (intracerebral hemorrhage) (HCC) 12/09/2018    No past surgical history on file.     No family history on file.  Social History   Tobacco Use  . Smoking status: Current Every Day Smoker    Packs/day: 1.00    Types: Cigarettes  . Smokeless tobacco: Never Used  Substance Use Topics  . Alcohol use: Yes  . Drug use: Yes    Types: Marijuana    Home Medications Prior to Admission medications   Medication Sig Start Date End Date Taking? Authorizing Provider  acetaminophen (TYLENOL) 325 MG tablet Take 2 tablets (650 mg total) by mouth every 6 (six) hours as needed for mild pain. Patient not taking: Reported on 06/10/2019 01/06/19   Angiulli, Mcarthur Rossetti, PA-C  ALPRAZolam Prudy Feeler) 0.25 MG tablet Take 1 tablet (0.25 mg total) by mouth 3 (three) times daily as needed for anxiety. Patient not taking: Reported on 06/10/2019 01/06/19   Angiulli, Mcarthur Rossetti, PA-C  Baclofen 5 MG TABS Take 5 mg by mouth 3 (three) times daily as  needed for muscle spasms. Patient not taking: Reported on 06/10/2019 01/06/19   Angiulli, Mcarthur Rossetti, PA-C  butalbital-acetaminophen-caffeine (FIORICET) [phone] MG tablet Take 1 tablet by mouth every 8 (eight) hours as needed for headache. Patient not taking: Reported on 06/10/2019 01/06/19   Angiulli, Mcarthur Rossetti, PA-C  traZODone (DESYREL) 50 MG tablet Take 1 tablet (50 mg total) by mouth at bedtime. Patient not taking: Reported on 06/10/2019 01/06/19   Angiulli, Mcarthur Rossetti, PA-C  valproic acid (DEPAKENE) 250 MG capsule Take 3 capsules (750 mg total) by mouth 3 (three) times daily. Patient not taking: Reported on 06/10/2019 01/06/19 06/09/28  Charlton Amor, PA-C    Allergies    Patient has no known allergies.  Review of Systems   Review of Systems  Respiratory: Negative for shortness of breath.   Cardiovascular: Negative for chest pain.  Neurological: Negative for headaches.  All other systems reviewed and are negative.   Physical Exam Updated Vital Signs BP 108/64   Pulse 66   Temp 98.2 F (36.8 C) (Oral)   Resp 16   Ht 5\' 10"  (1.778 m)   Wt 79.6 kg   SpO2 97%   BMI 25.18 kg/m   Physical Exam Vitals and nursing note reviewed.  Constitutional:      Appearance: He is well-developed.  HENT:     Head: Normocephalic and atraumatic.     Mouth/Throat:  Mouth: Mucous membranes are dry.     Pharynx: Oropharynx is clear.  Eyes:     Conjunctiva/sclera: Conjunctivae normal.  Cardiovascular:     Rate and Rhythm: Normal rate.  Pulmonary:     Effort: Pulmonary effort is normal. No respiratory distress.  Abdominal:     General: There is no distension.  Musculoskeletal:        General: Normal range of motion.     Cervical back: Normal range of motion.  Neurological:     Mental Status: He is alert.     Comments: Diminished strength on R>L, [name]>RUE.  CN's appropriate Affect flag     ED Results / Procedures / Treatments   Labs (all labs ordered are listed, but only  abnormal results are displayed) Labs Reviewed  COMPREHENSIVE METABOLIC PANEL - Abnormal; Notable for the following components:      Result Value   Glucose, Bld 101 (*)    Total Protein 6.4 (*)    Total Bilirubin <0.1 (*)    All other components within normal limits  VALPROIC ACID LEVEL - Abnormal; Notable for the following components:   Valproic Acid Lvl <10 (*)    All other components within normal limits  CBG MONITORING, ED - Abnormal; Notable for the following components:   Glucose-Capillary 100 (*)    All other components within normal limits  ETHANOL  PROTIME-INR  APTT  CBC  DIFFERENTIAL  RAPID URINE DRUG SCREEN, HOSP PERFORMED  URINALYSIS, ROUTINE W REFLEX MICROSCOPIC  I-STAT CHEM 8, ED    EKG EKG Interpretation  Date/Time:  Wednesday June 10 2019 00:44:26 EST Ventricular Rate:  87 PR Interval:    QRS Duration: 91 QT Interval:  373 QTC Calculation: 449 R Axis:   29 Text Interpretation: Sinus rhythm ST elev, probable normal early repol pattern No significant change since last tracing Confirmed by Marily MemosMesner, Finleigh Cheong [phone]([id number]) on 06/10/2019 1:47:04 AM   Radiology CT ANGIO HEAD W OR WO CONTRAST  Result Date: 06/10/2019 CLINICAL DATA:  Initial evaluation for acute stroke, right-sided deficits. EXAM: CT ANGIOGRAPHY HEAD AND NECK TECHNIQUE: Multidetector CT imaging of the head and neck was performed using the standard protocol during bolus administration of intravenous contrast. Multiplanar CT image reconstructions and MIPs were obtained to evaluate the vascular anatomy. Carotid stenosis measurements (when applicable) are obtained utilizing NASCET criteria, using the distal internal carotid diameter as the denominator. CONTRAST:  75mL OMNIPAQUE IOHEXOL 350 MG/ML SOLN COMPARISON:  Prior head CT from earlier same day. FINDINGS: CTA NECK FINDINGS Aortic arch: Examination mildly degraded by motion artifact. Visualized aortic arch normal in caliber with normal 3 vessel morphology. No  hemodynamically significant stenosis seen about the origin of the great vessels. Visualized subclavian arteries are widely patent. Right carotid system: Right common and internal carotid arteries widely patent without stenosis, dissection, or occlusion. Left carotid system: Left common and internal carotid arteries widely patent without stenosis, dissection or occlusion. Vertebral arteries: Both vertebral arteries arise from the subclavian arteries. Right vertebral artery dominant with a diffusely hypoplastic left vertebral artery. Vertebral arteries widely patent within the neck without stenosis, dissection or occlusion. Skeleton: No acute osseous abnormality. No discrete lytic or blastic osseous lesions. Few scattered dental caries with periapical lucencies noted about the dentition. Other neck: No other acute soft tissue abnormality within the neck. No mass lesion or adenopathy. Normal thyroid. Upper chest: Visualized upper chest demonstrates no acute finding. Partially visualized lungs are clear. Review of the MIP images confirms the above findings CTA HEAD  FINDINGS Anterior circulation: Both internal carotid arteries widely patent to the termini without stenosis or other abnormality. A1 segments widely patent. Normal anterior communicating artery. Anterior cerebral arteries widely patent to their distal aspects without stenosis. No M1 stenosis or occlusion. Normal MCA bifurcations. Distal MCA branches well perfused and symmetric. Posterior circulation: Both vertebral arteries widely patent to the vertebrobasilar junction without stenosis. Neither posterior inferior cerebral artery well visualized. Basilar diminutive but widely patent to its distal aspect without stenosis. Superior cerebral arteries patent bilaterally. PCA supplied via the basilar 0 as well as small bilateral posterior communicating arteries. Both PCAs widely patent to their distal aspects. Venous sinuses: Grossly patent allowing for timing the  contrast bolus. Anatomic variants: At site of previously seen hemorrhage at the posterior parafalcine left frontal parietal region, there is a small vascular malformation/AVM measuring approximately 1 cm in size (series 7, image 46). Prominent draining vein seen coursing posteriorly and superiorly towards the superior sagittal sinus. Finding most likely the underlying etiology of previously seen hemorrhage in this region. No other aneurysm or other vascular abnormality. Review of the MIP images confirms the above findings IMPRESSION: 1. Negative CTA for large vessel occlusion. No hemodynamically significant or correctable stenosis identified about the major arterial vasculature of the head and neck. 2. 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 These results were communicated to Dr. Rory Percy at 12:53 amon 12/16/2020by text page via the [facility] messaging system. Electronically Signed   By: Jeannine Boga M.D.   On: 06/10/2019 01:20   CT ANGIO NECK W OR WO CONTRAST  Result Date: 06/10/2019 CLINICAL DATA:  Initial evaluation for acute stroke, right-sided deficits. EXAM: CT ANGIOGRAPHY HEAD AND NECK TECHNIQUE: Multidetector CT imaging of the head and neck was performed using the standard protocol during bolus administration of intravenous contrast. Multiplanar CT image reconstructions and MIPs were obtained to evaluate the vascular anatomy. Carotid stenosis measurements (when applicable) are obtained utilizing NASCET criteria, using the distal internal carotid diameter as the denominator. CONTRAST:  22mL OMNIPAQUE IOHEXOL 350 MG/ML SOLN COMPARISON:  Prior head CT from earlier same day. FINDINGS: CTA NECK FINDINGS Aortic arch: Examination mildly degraded by motion artifact. Visualized aortic arch normal in caliber with normal 3 vessel morphology. No hemodynamically significant stenosis seen about the origin of the great vessels. Visualized  subclavian arteries are widely patent. Right carotid system: Right common and internal carotid arteries widely patent without stenosis, dissection, or occlusion. Left carotid system: Left common and internal carotid arteries widely patent without stenosis, dissection or occlusion. Vertebral arteries: Both vertebral arteries arise from the subclavian arteries. Right vertebral artery dominant with a diffusely hypoplastic left vertebral artery. Vertebral arteries widely patent within the neck without stenosis, dissection or occlusion. Skeleton: No acute osseous abnormality. No discrete lytic or blastic osseous lesions. Few scattered dental caries with periapical lucencies noted about the dentition. Other neck: No other acute soft tissue abnormality within the neck. No mass lesion or adenopathy. Normal thyroid. Upper chest: Visualized upper chest demonstrates no acute finding. Partially visualized lungs are clear. Review of the MIP images confirms the above findings CTA HEAD FINDINGS Anterior circulation: Both internal carotid arteries widely patent to the termini without stenosis or other abnormality. A1 segments widely patent. Normal anterior communicating artery. Anterior cerebral arteries widely patent to their distal aspects without stenosis. No M1 stenosis or occlusion. Normal MCA bifurcations. Distal MCA branches well perfused and symmetric. Posterior circulation: Both vertebral arteries widely patent to  the vertebrobasilar junction without stenosis. Neither posterior inferior cerebral artery well visualized. Basilar diminutive but widely patent to its distal aspect without stenosis. Superior cerebral arteries patent bilaterally. PCA supplied via the basilar 0 as well as small bilateral posterior communicating arteries. Both PCAs widely patent to their distal aspects. Venous sinuses: Grossly patent allowing for timing the contrast bolus. 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 Prominent draining vein seen coursing posteriorly and superiorly towards the superior sagittal sinus. Finding most likely the underlying etiology of previously seen hemorrhage in this region. No other aneurysm or other vascular abnormality. Review of the MIP images confirms the above findings IMPRESSION: 1. Negative CTA for large vessel occlusion. No hemodynamically significant or correctable stenosis identified about the major arterial vasculature of the head and neck. 2. 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 These results were communicated to Dr. Wilford Corner at 12:53 amon 12/16/2020by text page via the [facility] messaging system. Electronically Signed   By: Rise Mu M.D.   On: 06/10/2019 01:20   CT HEAD CODE STROKE WO CONTRAST  Result Date: 06/10/2019 CLINICAL DATA:  Code stroke. Initial evaluation for acute right-sided deficits. EXAM: CT HEAD WITHOUT CONTRAST TECHNIQUE: Contiguous axial images were obtained from the base of the skull through the vertex without intravenous contrast. COMPARISON:  None. FINDINGS: Brain: Previously seen hemorrhage centered at the parasagittal left posterior frontal parietal region now demonstrates chronic encephalomalacia. No acute intracranial hemorrhage. No acute large vessel territory infarct. No mass lesion, midline shift or mass effect. No hydrocephalus. No extra-axial fluid collection. Vascular: No hyperdense vessel. Skull: Scalp soft tissues and calvarium within normal limits. Sinuses/Orbits: Globes orbital soft tissues are normal. Small left maxillary sinus retention cyst noted. Paranasal sinuses and mastoid air cells are otherwise clear. Other: None. ASPECTS [facility] Stroke Program Early CT Score) - Ganglionic level infarction (caudate, lentiform  nuclei, internal capsule, insula, M1-M3 cortex): 7 - Supraganglionic infarction (M4-M6 cortex): 3 Total score (0-10 with 10 being normal): 10 IMPRESSION: 1. No acute intracranial infarct or other abnormality. 2. ASPECTS is 10. 3. Chronic encephalomalacia at site of previously seen hemorrhage at the parasagittal posterior left frontal parietal region. These results were communicated to Dr. Wilford Corner at 12:44 amon 12/16/2020by text page via the [facility] messaging system. Electronically Signed   By: Rise Mu M.D.   On: 06/10/2019 00:47    Procedures Procedures (including critical care time)  Medications Ordered in ED Medications  iohexol (OMNIPAQUE) 350 MG/ML injection 75 mL (75 mLs Intravenous Contrast Given 06/10/19 0040)    ED Course  I have reviewed the triage vital signs and the nursing notes.  Pertinent labs & imaging results that were available during my care of the patient were reviewed by me and considered in my medical decision making (see chart for details).    MDM Rules/Calculators/A&P                      eval for stgroke. If MRI negative, discharge.   refuses MRI.   AMA and competent to make decision with family at bedside.  Final Clinical Impression(s) / ED Diagnoses Final diagnoses:  Left-sided weakness    Rx / DC Orders ED Discharge Orders    None       Ridley Schewe, Barbara Cower, MD 06/11/19 [phone]

## 2019-06-10 NOTE — ED Notes (Signed)
Pt refusing MRI. Does not want medication for claustrophobia. Pt educated about the importance of the scan. EDP notified.

## 2019-06-10 NOTE — Consult Note (Signed)
Neurology Consultation  Reason for Consult: Code stroke-right-sided weakness Referring Physician: Dr. Dolly Rias  CC: Right-sided weakness  History is obtained from: Patient, chart  HPI: Walter Grimes is a 28 y.o. 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 According to the patient, he was playing a game with friends, and is noted sudden onset of cramping on his leg and weakness of his right leg worse than the baseline hemiparesis.  He says it felt like similar sensation when he had his prior ICH in early part of the year.  EMS was called.  They noted right-sided leg weakness as a focal neurological deficit and activated code stroke.  Systolic blood pressure 867E. Patient was examined at the ED bridge-detailed exam below. Not a candidate for IV TPA due to her recent large ICH/IVH. Denies headache visual changes.  Of note from patient's chart, he has been noncompliant to his follow-up appointments with neurology as well as rehab.  LKW: 11 PM on 06/09/2019 tpa given?: no, relatively recent large ICH and IVH, exam nearly similar to baseline-see details below Premorbid modified Rankin scale (mRS): 1-2  ROS: ROS was performed and is negative except as noted in the HPI.   No past medical history on file. ICH IVH  No family history on file. No family history of strokes  Social History:   reports that he has been smoking cigarettes. He has been smoking about 1.00 pack per day. He has never used smokeless tobacco. He reports current alcohol use. He reports current drug use. Drug: Marijuana. Current abuser of marijuana  Medications No current facility-administered medications for this encounter.  Current Outpatient Medications:  .  acetaminophen (TYLENOL) 325 MG tablet, Take 2  tablets (650 mg total) by mouth every 6 (six) hours as needed for mild pain., Disp:  , Rfl:  .  ALPRAZolam (XANAX) 0.25 MG tablet, Take 1 tablet (0.25 mg total) by mouth 3 (three) times daily as needed for anxiety., Disp: 30 tablet, Rfl: 0 .  Baclofen 5 MG TABS, Take 5 mg by mouth 3 (three) times daily as needed for muscle spasms., Disp: 30 tablet, Rfl: 0 .  butalbital-acetaminophen-caffeine (FIORICET) 50-325-40 MG tablet, Take 1 tablet by mouth every 8 (eight) hours as needed for headache., Disp: 14 tablet, Rfl: 0 .  traZODone (DESYREL) 50 MG tablet, Take 1 tablet (50 mg total) by mouth at bedtime., Disp: 20 tablet, Rfl: 0 .  valproic acid (DEPAKENE) 250 MG capsule, Take 3 capsules (750 mg total) by mouth 3 (three) times daily., Disp: 270 capsule, Rfl: 0  Exam: Current vital signs: There were no vitals taken for this visit. Vital signs in last 24 hours: BP: ()/()  Arterial Line BP: ()/()  General: Awake alert, slightly distressed by his right leg symptoms. HEENT: Normocephalic atraumatic Lungs: Clear to auscultation Cardiovascular: Regular rate rhythm Abdomen: Soft nondistended nontender Extremities: Warm well perfused Neurological exam Mental status: Awake alert oriented x3 Speech: Not dysarthric Language: Naming comprehension repetition appears to be intact but he is a little slow to respond to all questions. Attention concentration: Mildly reduced Cranial nerves: Pupils equal round reactive light, extraocular muscles are, visual fields full, facial sensation intact, face symmetric, tongue and palate midline. Motor exam: Left upper and lower extremity are antigravity full-strength.  Right upper extremity  is 4+/5.  Right lower extremity is 3-4/5 with some effort dependent weakness and moderately increased tone. Sensory exam: Intact to touch all over Coordination: Intact finger-nose-finger testing Gait testing deferred at this time NIH stroke scale 1a Level of Conscious.: 0 1b LOC  Questions: 0 1c LOC Commands: 0 2 Best Gaze: 0 3 Visual: 0 4 Facial Palsy: 0 5a Motor Arm - left: 0 5b Motor Arm - Right: 0 6a Motor Leg - Left: 0 6b Motor Leg - Right: 2 7 Limb Ataxia: 0 8 Sensory: 0 9 Best Language: 0 10 Dysarthria: 0 11 Extinct. and Inatten.: 0 TOTAL: 2   Labs I have reviewed labs in epic and the results pertinent to this consultation are: CBC    Component Value Date/Time   WBC 6.3 12/22/2018 0719   RBC 4.98 12/22/2018 0719   HGB 15.4 12/22/2018 0719   HCT 44.1 12/22/2018 0719   PLT 323 12/22/2018 0719   MCV 88.6 12/22/2018 0719   MCH 30.9 12/22/2018 0719   MCHC 34.9 12/22/2018 0719   RDW 11.6 12/22/2018 0719   LYMPHSABS 3.0 12/22/2018 0719   MONOABS 0.9 12/22/2018 0719   EOSABS 0.1 12/22/2018 0719   BASOSABS 0.0 12/22/2018 0719    CMP     Component Value Date/Time   NA 139 01/05/2019 1134   K 4.1 01/05/2019 1134   CL 102 01/05/2019 1134   CO2 29 01/05/2019 1134   GLUCOSE 98 01/05/2019 1134   BUN 10 01/05/2019 1134   CREATININE 1.06 01/05/2019 1134   CALCIUM 9.8 01/05/2019 1134   PROT 7.4 01/05/2019 1134   ALBUMIN 4.2 01/05/2019 1134   AST 45 (H) 01/05/2019 1134   ALT 76 (H) 01/05/2019 1134   ALKPHOS 66 01/05/2019 1134   BILITOT 0.5 01/05/2019 1134   GFRNONAA >60 01/05/2019 1134   GFRAA >60 01/05/2019 1134    Imaging I have reviewed the images obtained: CT-scan of the brain-chronic left frontal encephalomalacia from the prior bleed.  No acute changes.  No acute bleed.  Assessment: 27 year old man past history of left frontal ICH of unknown etiology June 2020 with residual right hemiparesis in the leg worse than arm, seizures following the ICH, presenting for acute onset of worsening stiffness and pain in the right leg along with some weakness. Examination as above with an NIH stroke scale of 2. Due to recent bleed as well as a low stroke scale, not a candidate for IV TPA although it is in the time window. No cortical signs on  exam. CTA head and neck pending.  Current symptoms likely recrudescence of old symptoms versus focal seizure activity versus spasms from hemiparesis. Given young age and unknown etiology of the bleed, will repeat vascular imaging-CT head and neck pending. We will also repeat MRI of the brain without contrast to rule out any ischemic stroke component. If CT head and neck and MRI unremarkable, no further inpatient work-up advised. Also want to check valproate levels-has history of noncompliance  Recommendations: CTA head and neck MRI brain without contrast Check valproate levels If all of the above unremarkable, can be discharged home with outpatient follow-up. I will follow up on these test results. Plan relayed to the ED provider-Dr. Erin Hearing in person.   -- Milon Dikes, MD [facility] Pager: [phone] If 7pm to 7am, please call on call as listed on AMION.   ADDENDUM Valproate level undetectable. Pt refused MRI and was discharged from ER AMA.  -- Milon Dikes, MD [facility] Pager: [phone] If  7pm to 7am, please call on call as listed on AMION.

## 2019-06-10 NOTE — ED Triage Notes (Addendum)
Pt presents to ED from home as code stroke BIB [facility]. LSN 2300. Pt c/o "strange" feeling in his R leg, also c/o new posturing in R foot. Pt hx of previous CVA with R sided leg weakness deficits. EMS VSS.

## 2019-06-10 NOTE — ED Notes (Signed)
Discharge instructions discussed with pt. Pt verbalized understanding. Pt stable and ambulatory. No signature pad available. 

## 2020-01-28 IMAGING — CT CT HEAD WITHOUT CONTRAST
3 of 4 series · 13 of 47 positions shown, 15 images · non-contrast
Comparison: CT head without contrast 12/10/2018

CLINICAL DATA: Intracranial hemorrhage, known, follow-up

EXAM:
CT HEAD WITHOUT CONTRAST
TECHNIQUE: Contiguous axial images were obtained from the base of the skull
through the vertex without intravenous contrast.

[Series 3: head without · axial · non-contrast · 0.44mm/px · z∈[-97,+23]mm · 7 of 34 slices shown, 9 images]
[im 5/34  brain]
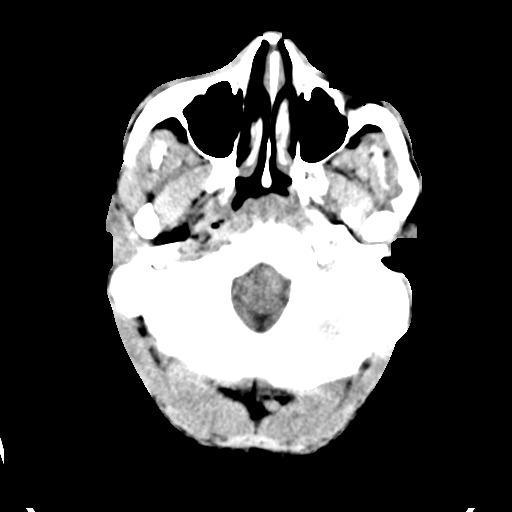
[im 5/34  bone]
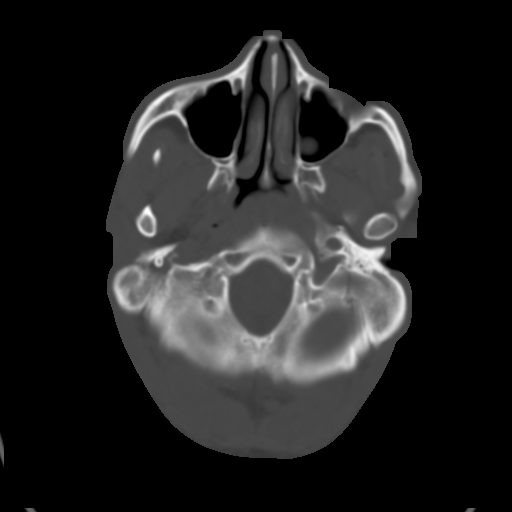
[im 9/34  brain]
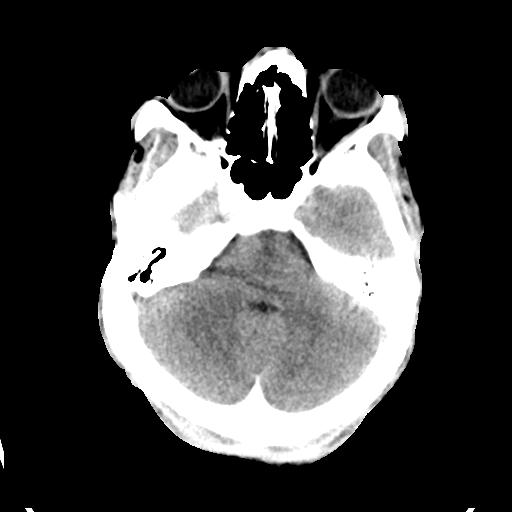
[im 13/34  brain]
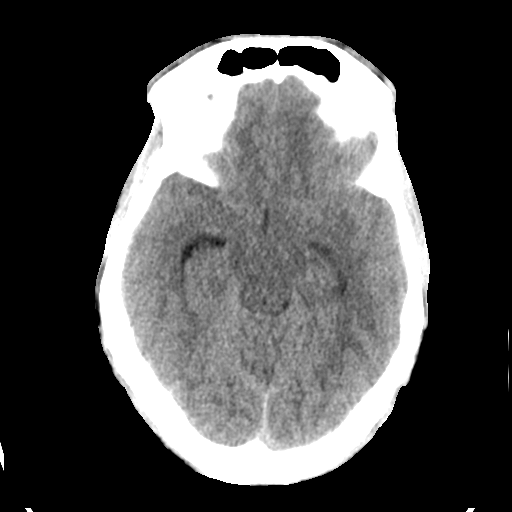
[im 17/34  brain]
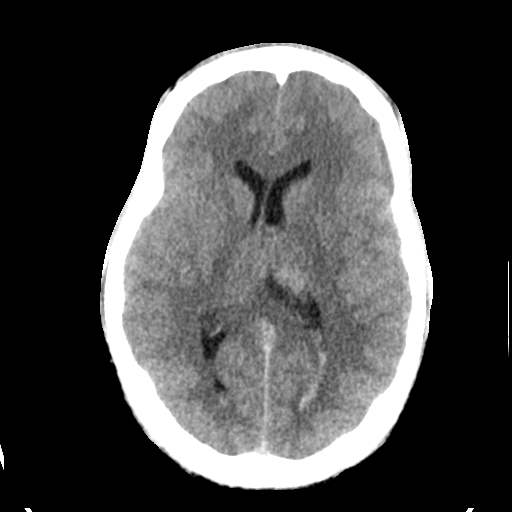
[im 21/34  brain]
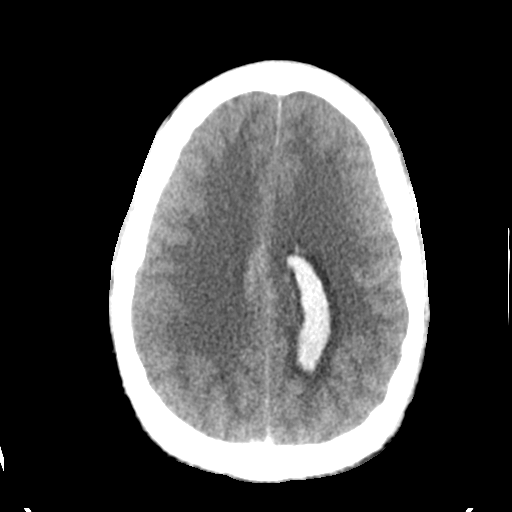
[im 21/34  bone]
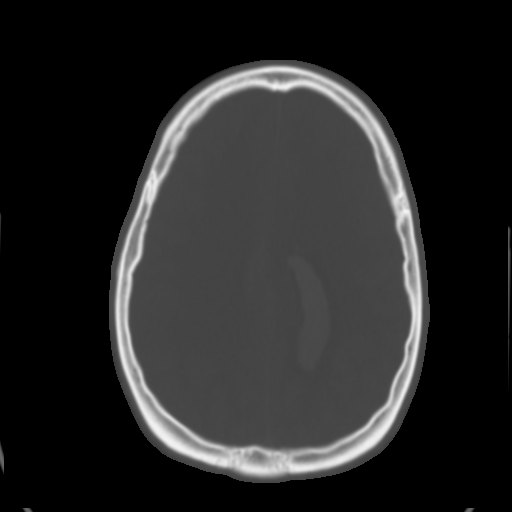
[im 25/34  brain]
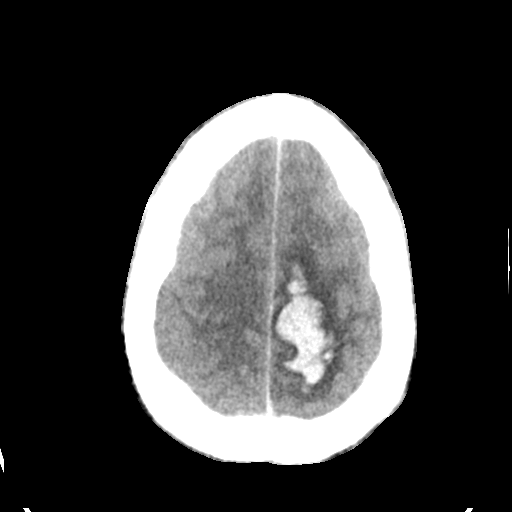
[im 29/34  brain]
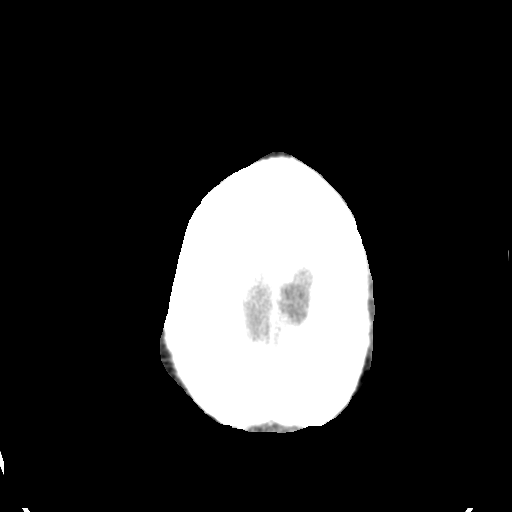

[Series 5: head without cor · coronal · non-contrast · 0.33mm/px · 3 of 84 slices shown]
[im 28/84  brain]
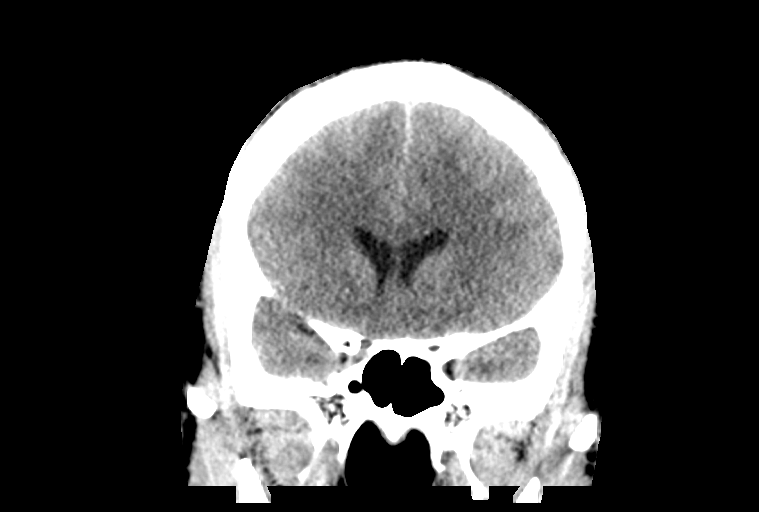
[im 37/84  brain]
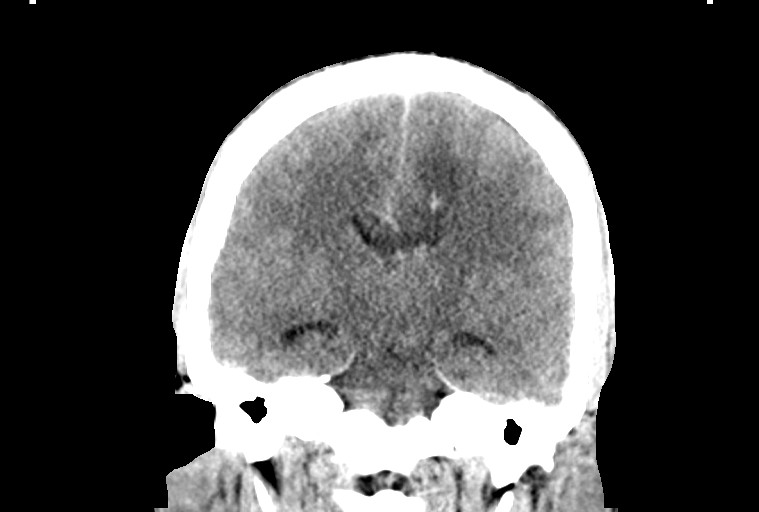
[im 47/84  brain]
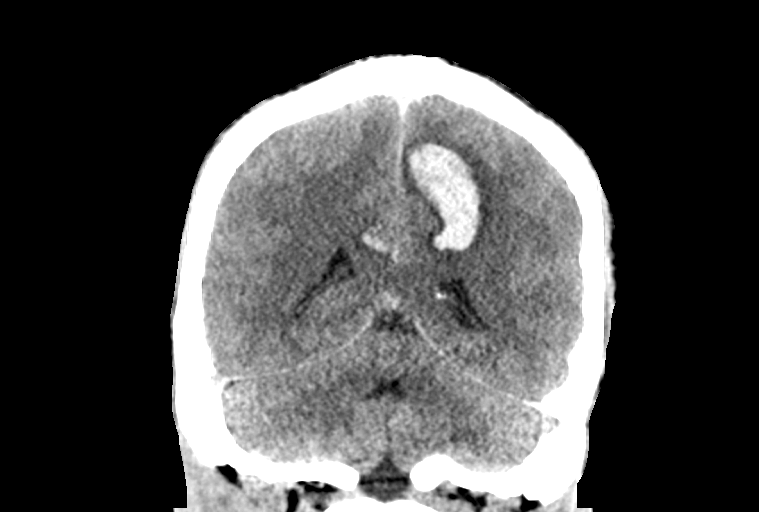

[Series 6: head without sag · sagittal · non-contrast · 0.33mm/px · 3 of 67 slices shown]
[im 23/67  brain]
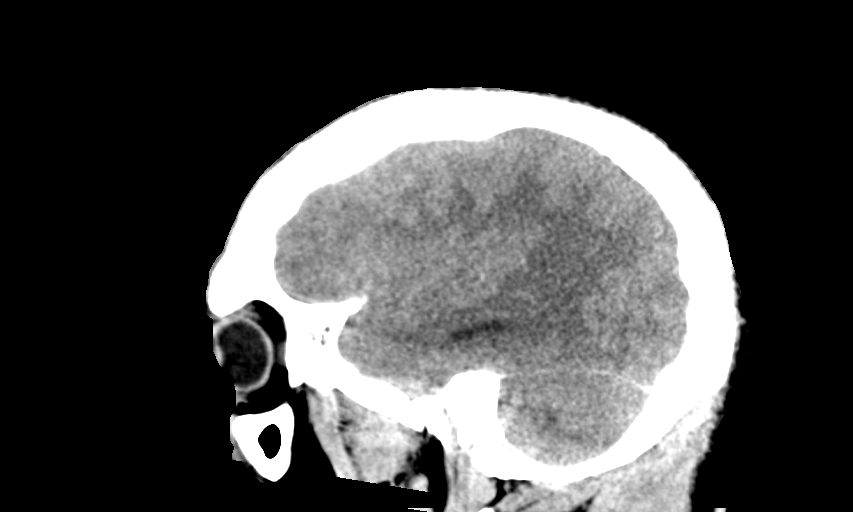
[im 34/67  brain]
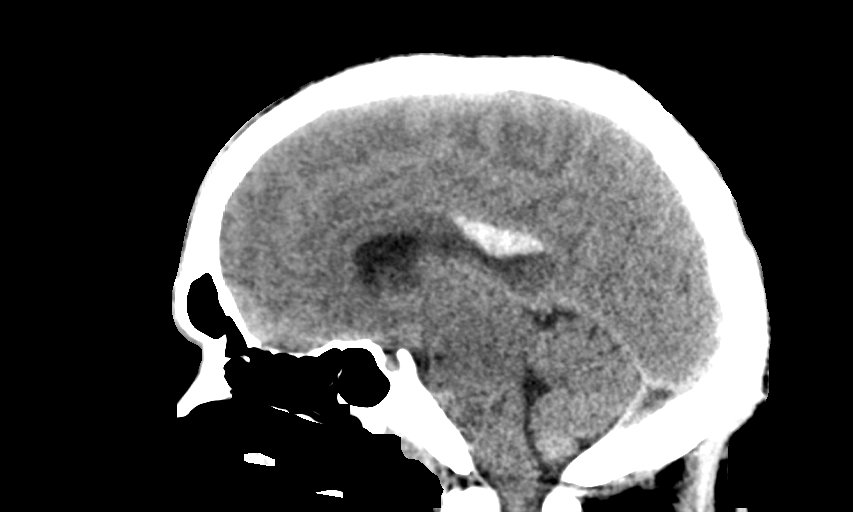
[im 45/67  brain]
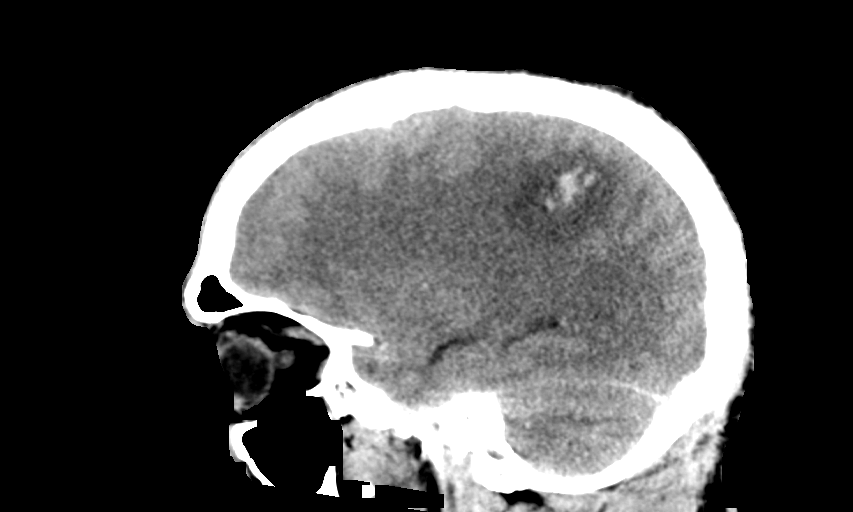

[13 of 47 positions shown; findings below may reference images not displayed]

FINDINGS: Brain: Left posterior frontal and parietal hemorrhage is again
noted. There is some contraction of the clot with surrounding edema,
as expected. Hemorrhage extends across the posterior corpus
callosum. No new hemorrhage is present. Intraventricular hemorrhage
is evident bilaterally. There is no hydrocephalus. No new hemorrhage
is present. No significant extra-axial fluid collection is present.

No new hemorrhage is present. No acute infarct is present. The
ventricles are of normal size.

Vascular: No hyperdense vessel or unexpected calcification.

Skull: Calvarium is intact. No focal lytic or blastic lesions are
present.

Sinuses/Orbits: A polyp or mucous retention cyst in the left
maxillary sinus is stable. The paranasal sinuses and mastoid air
cells are otherwise clear.
IMPRESSION: 1. Continued evolution of posterior left frontal and parietal
hematoma without significant expansion. There may be some
contraction of the clot with developing edema.
2. Stable extension of hemorrhage into the corpus callosum.
3. Intraventricular hemorrhage without significant hydrocephalus.

## 2020-02-01 IMAGING — MR MRI HEAD WITH CONTRAST
4 series · 19 of 48 positions shown · IV contrast (Yes   MULTIHANCE)
Comparison: Head CT 12/12/2018

Brain MRI without contrast 12/09/2018

CLINICAL DATA: Intracranial hemorrhage follow up

EXAM:
MRI HEAD WITH CONTRAST
TECHNIQUE: Multiplanar, multiecho pulse sequences of the brain and surrounding
structures were obtained with intravenous contrast.
CONTRAST:  7.5 mL Gadavist

[Series 3: T1 · axial · 3.0mm · 0.47mm/px · z∈[-43,+64]mm · 3 of 100 slices shown]
[im 14/100]
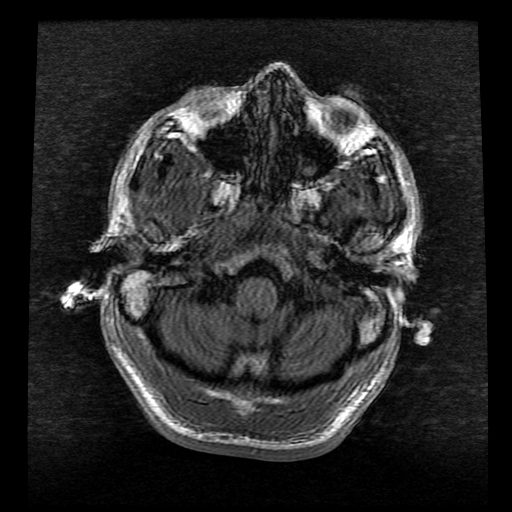
[im 50/100]
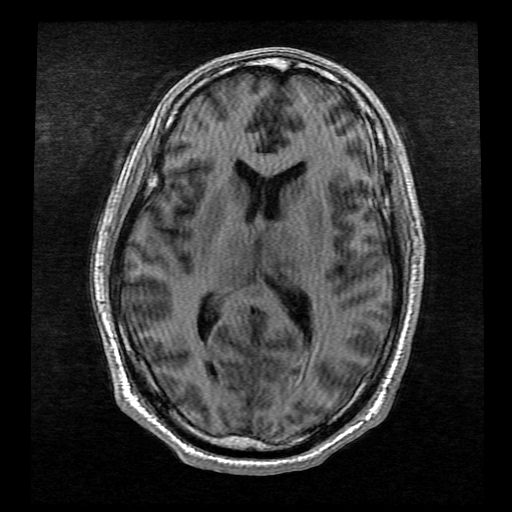
[im 86/100]
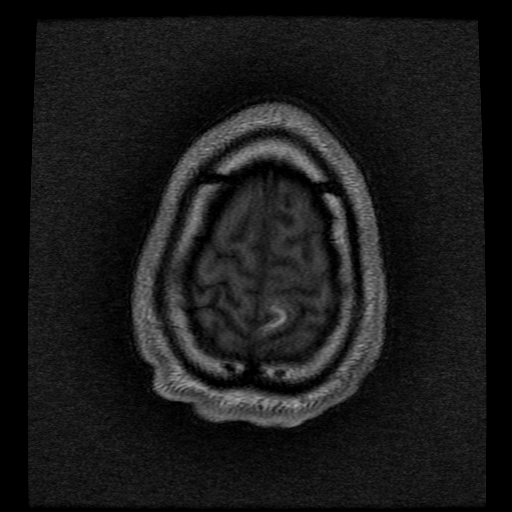

[Series 4: T1 post-contrast · axial · 3.0mm · 0.47mm/px · z∈[-63,+84]mm · 9 of 50 slices shown (1 of 3)]
[im 1/50]
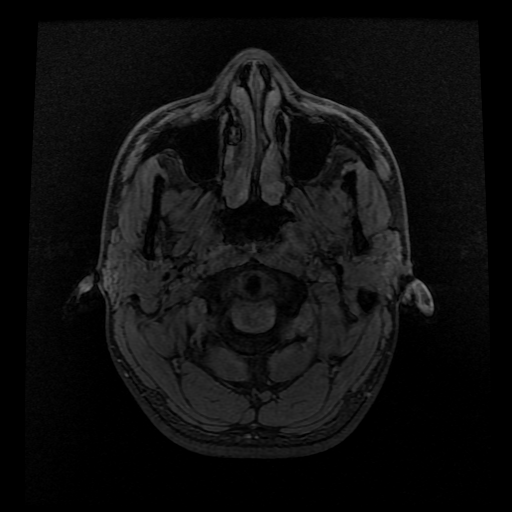
[im 9/50]
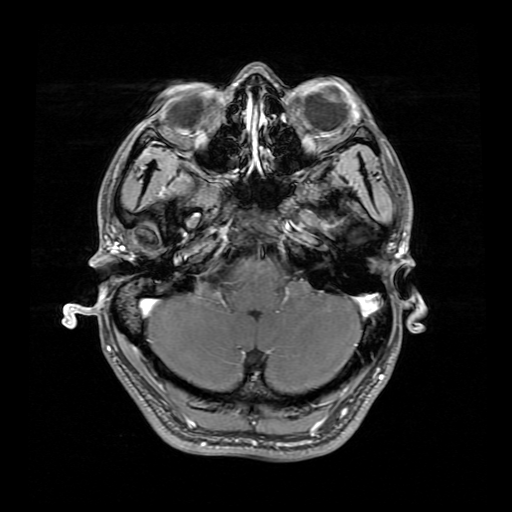
[im 14/50]
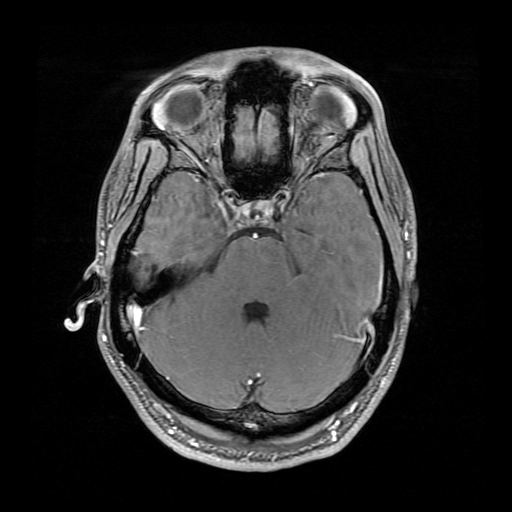
[im 23/50]
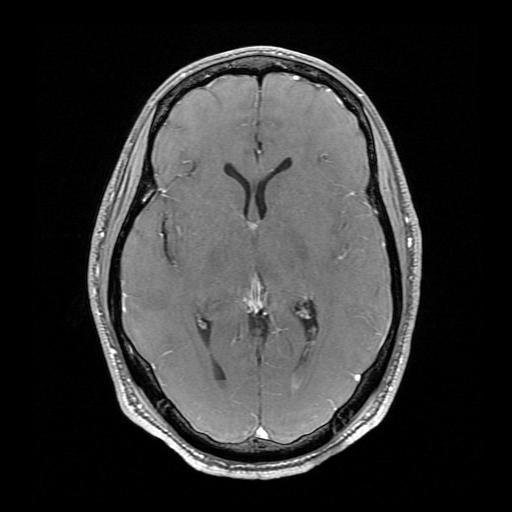
[im 27/50]
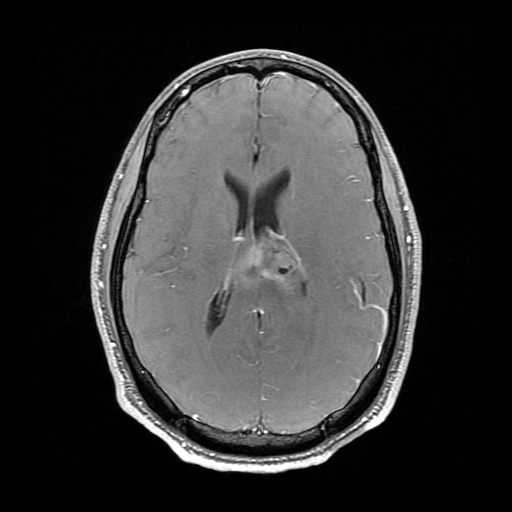
[im 36/50]
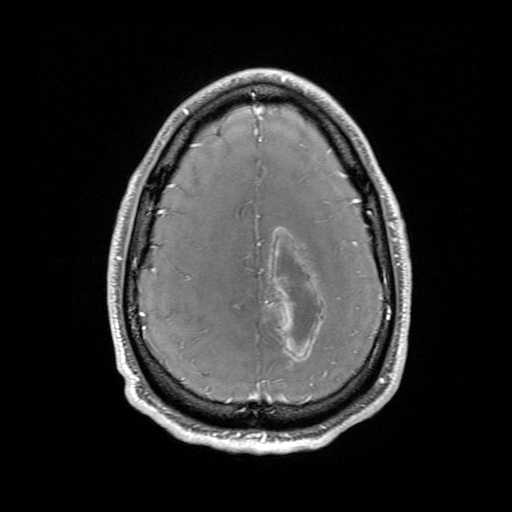
[im 41/50]
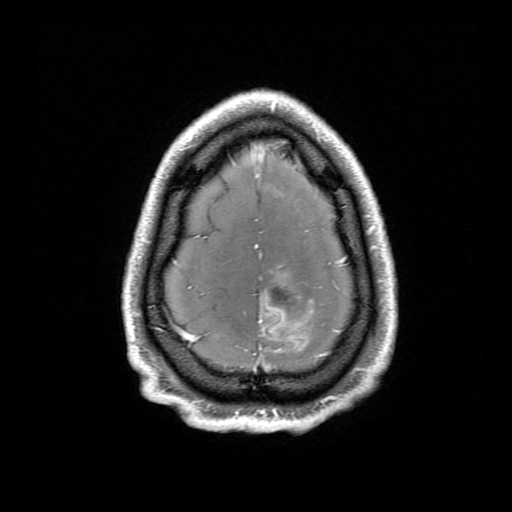
[im 45/50]
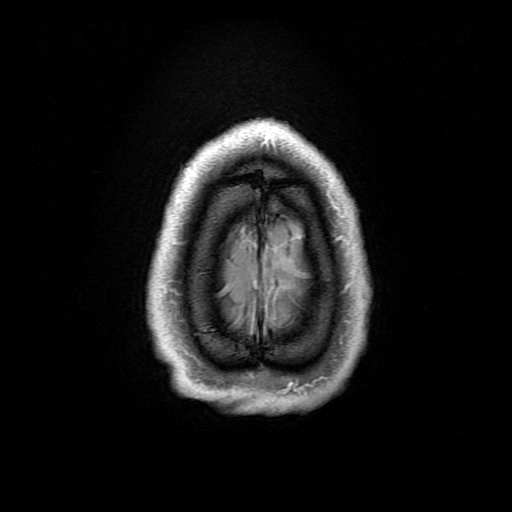
[im 50/50]
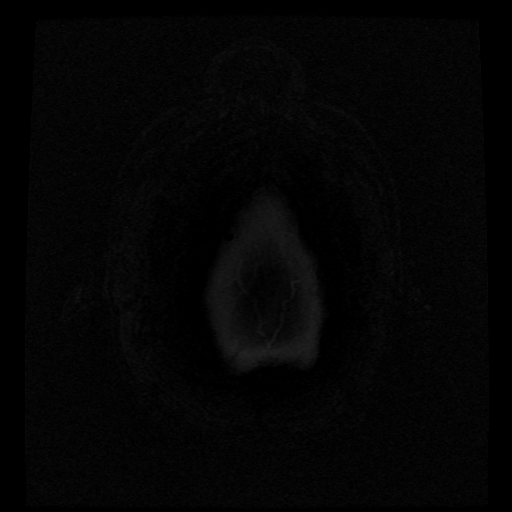

[Series 5: T1 post-contrast · coronal · 5.0mm · 0.43mm/px · 4 of 30 slices shown (2 of 3)]
[im 1/30]
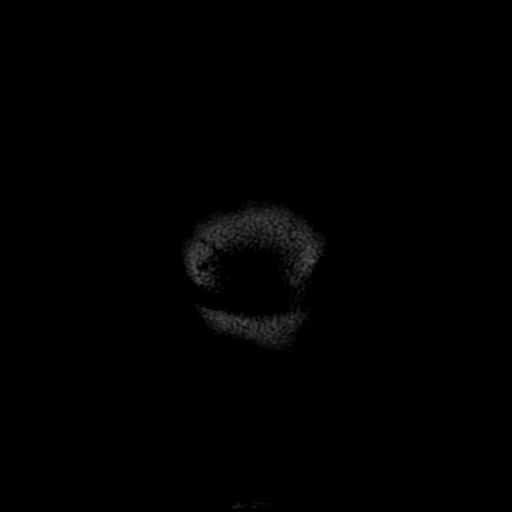
[im 5/30]
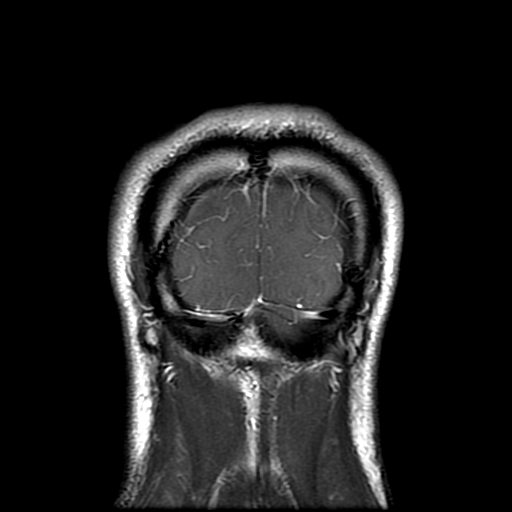
[im 15/30]
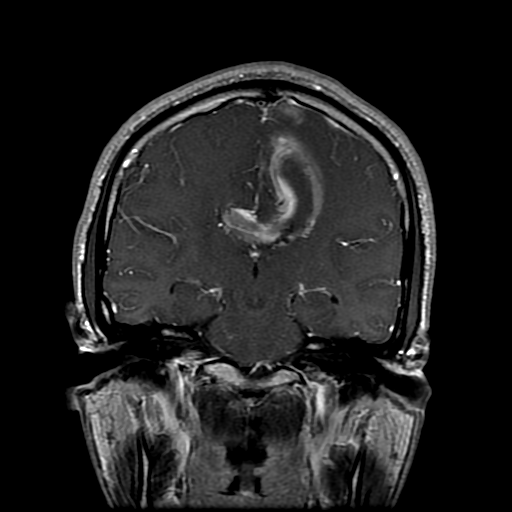
[im 25/30]
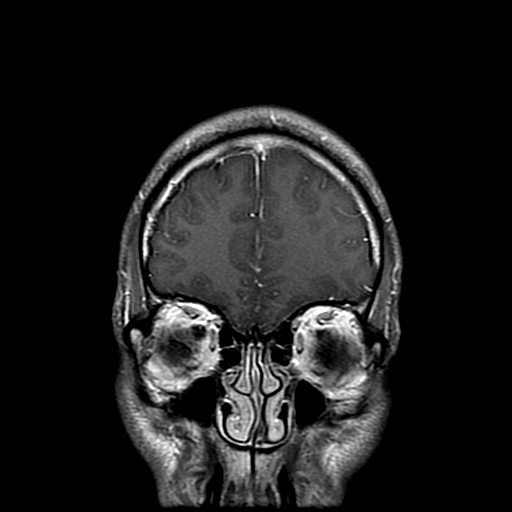

[Series 6: T1 post-contrast · sagittal · 5.0mm · 0.47mm/px · 3 of 25 slices shown (3 of 3)]
[im 5/25]
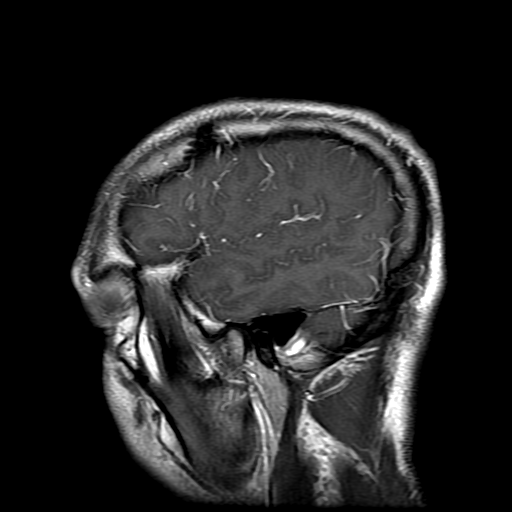
[im 15/25]
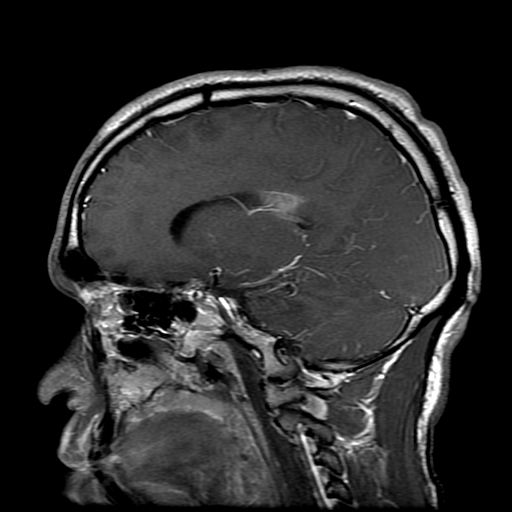
[im 25/25]
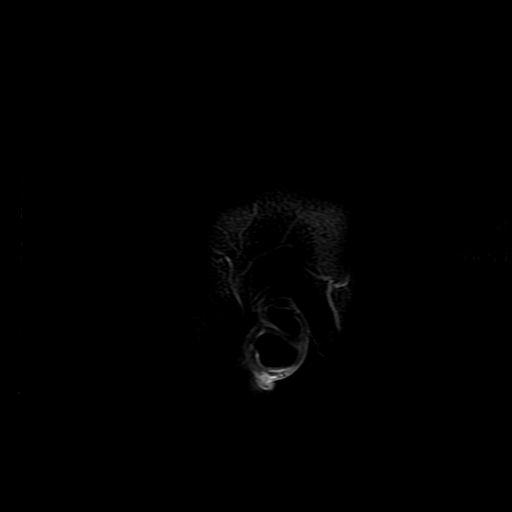

[19 of 48 positions shown; findings below may reference images not displayed]

FINDINGS: Mildly motion degraded study. There is unchanged intraparenchymal
hematoma within the left parietal lobe with intraventricular
extension and involvement of the corpus callosum. There is intrinsic
GQVO-hyperintensity surrounding the hemorrhage. There is no abnormal
contrast enhancement. Small amount of left convexity subarachnoid
blood, unchanged. Unchanged size and configuration of the
ventricles.
IMPRESSION: No abnormal contrast enhancement. Unchanged left parietal hemorrhage
extending into the ventricles, corpus callosum and left convexity
subarachnoid space.
